# Patient Record
Sex: Female | Born: 1990 | Race: White | Hispanic: No | Marital: Married | Smoking: Never smoker
Health system: Southern US, Community
[De-identification: ages and names within clinical notes are randomized; demographics above are authoritative.]

## PROBLEM LIST (undated history)

## (undated) DIAGNOSIS — E282 Polycystic ovarian syndrome: Secondary | ICD-10-CM

## (undated) DIAGNOSIS — E785 Hyperlipidemia, unspecified: Secondary | ICD-10-CM

## (undated) DIAGNOSIS — Z87442 Personal history of urinary calculi: Secondary | ICD-10-CM

## (undated) DIAGNOSIS — I10 Essential (primary) hypertension: Secondary | ICD-10-CM

## (undated) DIAGNOSIS — F339 Major depressive disorder, recurrent, unspecified: Secondary | ICD-10-CM

## (undated) DIAGNOSIS — F419 Anxiety disorder, unspecified: Secondary | ICD-10-CM

## (undated) DIAGNOSIS — K760 Fatty (change of) liver, not elsewhere classified: Secondary | ICD-10-CM

## (undated) DIAGNOSIS — N979 Female infertility, unspecified: Secondary | ICD-10-CM

## (undated) DIAGNOSIS — E8881 Metabolic syndrome: Secondary | ICD-10-CM

## (undated) DIAGNOSIS — N96 Recurrent pregnancy loss: Secondary | ICD-10-CM

## (undated) DIAGNOSIS — K829 Disease of gallbladder, unspecified: Secondary | ICD-10-CM

## (undated) DIAGNOSIS — R0602 Shortness of breath: Secondary | ICD-10-CM

## (undated) DIAGNOSIS — E669 Obesity, unspecified: Secondary | ICD-10-CM

## (undated) DIAGNOSIS — R7303 Prediabetes: Secondary | ICD-10-CM

## (undated) DIAGNOSIS — F411 Generalized anxiety disorder: Secondary | ICD-10-CM

## (undated) DIAGNOSIS — Z9889 Other specified postprocedural states: Secondary | ICD-10-CM

## (undated) DIAGNOSIS — M549 Dorsalgia, unspecified: Secondary | ICD-10-CM

## (undated) DIAGNOSIS — E039 Hypothyroidism, unspecified: Secondary | ICD-10-CM

## (undated) DIAGNOSIS — R112 Nausea with vomiting, unspecified: Secondary | ICD-10-CM

## (undated) DIAGNOSIS — D649 Anemia, unspecified: Secondary | ICD-10-CM

## (undated) DIAGNOSIS — F410 Panic disorder [episodic paroxysmal anxiety] without agoraphobia: Secondary | ICD-10-CM

## (undated) DIAGNOSIS — R600 Localized edema: Secondary | ICD-10-CM

## (undated) HISTORY — DX: Localized edema: R60.0

## (undated) HISTORY — PX: DILATION AND CURETTAGE OF UTERUS: SHX78

## (undated) HISTORY — DX: Dorsalgia, unspecified: M54.9

## (undated) HISTORY — DX: Obesity, unspecified: E66.9

## (undated) HISTORY — DX: Shortness of breath: R06.02

## (undated) HISTORY — DX: Essential (primary) hypertension: I10

## (undated) HISTORY — DX: Disease of gallbladder, unspecified: K82.9

## (undated) HISTORY — DX: Hyperlipidemia, unspecified: E78.5

## (undated) HISTORY — DX: Fatty (change of) liver, not elsewhere classified: K76.0

## (undated) HISTORY — PX: WISDOM TOOTH EXTRACTION: SHX21

## (undated) HISTORY — DX: Anxiety disorder, unspecified: F41.9

## (undated) HISTORY — DX: Anemia, unspecified: D64.9

## (undated) HISTORY — DX: Generalized anxiety disorder: F41.1

## (undated) HISTORY — DX: Metabolic syndrome: E88.810

## (undated) HISTORY — DX: Female infertility, unspecified: N97.9

## (undated) HISTORY — DX: Prediabetes: R73.03

## (undated) HISTORY — PX: OTHER SURGICAL HISTORY: SHX169

## (undated) HISTORY — DX: Major depressive disorder, recurrent, unspecified: F33.9

## (undated) HISTORY — DX: Metabolic syndrome: E88.81

## (undated) HISTORY — DX: Recurrent pregnancy loss: N96

## (undated) HISTORY — DX: Polycystic ovarian syndrome: E28.2

---
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
## 2011-06-20 ENCOUNTER — Ambulatory Visit (INDEPENDENT_AMBULATORY_CARE_PROVIDER_SITE_OTHER): Payer: BC Managed Care – PPO | Admitting: Gynecology

## 2011-06-20 ENCOUNTER — Encounter: Payer: Self-pay | Admitting: Gynecology

## 2011-06-20 ENCOUNTER — Other Ambulatory Visit (HOSPITAL_COMMUNITY)
Admission: RE | Admit: 2011-06-20 | Discharge: 2011-06-20 | Disposition: A | Discharge status: Home / Self Care | Payer: BC Managed Care – PPO | Source: Ambulatory Visit | Attending: Gynecology | Admitting: Gynecology

## 2011-06-20 VITALS — BP 124/68 | Ht 63.0 in | Wt 249.0 lb

## 2011-06-20 DIAGNOSIS — Z01419 Encounter for gynecological examination (general) (routine) without abnormal findings: Secondary | ICD-10-CM | POA: Insufficient documentation

## 2011-06-20 DIAGNOSIS — Z131 Encounter for screening for diabetes mellitus: Secondary | ICD-10-CM

## 2011-06-20 DIAGNOSIS — Z113 Encounter for screening for infections with a predominantly sexual mode of transmission: Secondary | ICD-10-CM

## 2011-06-20 DIAGNOSIS — Z13220 Encounter for screening for lipoid disorders: Secondary | ICD-10-CM

## 2011-06-20 LAB — CBC WITH DIFFERENTIAL/PLATELET
Basophils Absolute: 0 10*3/uL (ref 0.0–0.1)
Basophils Relative: 0 % (ref 0–1)
Eosinophils Absolute: 0.5 10*3/uL (ref 0.0–0.7)
Eosinophils Relative: 5 % (ref 0–5)
HCT: 37.3 % (ref 36.0–46.0)
Hemoglobin: 12.6 g/dL (ref 12.0–15.0)
MCH: 27.3 pg (ref 26.0–34.0)
MCHC: 33.8 g/dL (ref 30.0–36.0)
MCV: 80.9 fL (ref 78.0–100.0)
Monocytes Absolute: 0.8 10*3/uL (ref 0.1–1.0)
Monocytes Relative: 8 % (ref 3–12)
RDW: 13.9 % (ref 11.5–15.5)

## 2011-06-20 LAB — LIPID PANEL
Cholesterol: 178 mg/dL (ref 0–200)
LDL Cholesterol: 102 mg/dL — ABNORMAL HIGH (ref 0–99)
Total CHOL/HDL Ratio: 4.7 Ratio
VLDL: 38 mg/dL (ref 0–40)

## 2011-06-20 NOTE — Progress Notes (Signed)
Diana Avery [birth year] [id number]        21 y.o.  New patient for annual exam.    Past medical history,surgical history, medications, allergies, family history and social history were all reviewed and documented in the EPIC chart. ROS:  Was performed and pertinent positives and negatives are included in the history.  Exam: Diana Avery chaperone present Filed Vitals:   06/20/11 1359  BP: 124/68   General appearance  Normal Skin grossly normal Head/Neck normal with no cervical or supraclavicular adenopathy thyroid normal Lungs  clear Cardiac RR, without RMG Abdominal  soft, nontender, without masses, organomegaly or hernia Breasts  examined lying and sitting without masses, retractions, discharge or axillary adenopathy. Pelvic  Ext/BUS/vagina  normal   Cervix  normal Pap done  Uterus  anteverted, normal size, shape and contour, midline and mobile nontender   Adnexa  Without masses or tenderness    Anus and perineum  normal     Assessment/Plan:  21 y.o. female for annual exam. 1. Contraceptive management. Patient sexually active and not using consistent contraception. Had used birth control pills but had difficulty remembering. She tried Depo-Provera but gained a lot of weight.  She did use NuvaRing and did well but wants to consider Nexplanon. I reviewed all available forms of reversible contraception to include pills patch ring, Nexplanon Depo-Provera, IUD.  Risks and benefits, pros and cons of each option reviewed. Patient's interested in either Nexplanon or Mirena IUD. Insertional process for both were reviewed.  The risks to include infection, perforation requiring surgery to remove, migration and failure with IUD discussed. The risks of infection, migration, underlying neurovascular injury, failure and surgery required for removal with Nexplanon discussed.  Patient will decide and follow up during her menses. 2. STD screening. GC Chlamydia screen done. Need for condoms regardless a  decrease STD risk discussed. 3. Gardasil. Patient reports having the Gardasil series x3 at age 43. 4. Breast health. SBE monthly reviewed. 5. Pap smears. Pap smear done today. Discussed every 3 year screening per current screening guidelines. 6. Health maintenance. Baseline CBC glucose lipid profile urinalysis ordered.   Diana Lords MD, 3:01 PM 06/20/2011

## 2011-06-20 NOTE — Patient Instructions (Signed)
Follow up with contraception decision.  Follow up in one year for annual gynecologic exam

## 2011-06-21 ENCOUNTER — Telehealth: Payer: Self-pay | Admitting: *Deleted

## 2011-06-21 ENCOUNTER — Other Ambulatory Visit: Payer: Self-pay | Admitting: *Deleted

## 2011-06-21 DIAGNOSIS — Z3049 Encounter for surveillance of other contraceptives: Secondary | ICD-10-CM

## 2011-06-21 LAB — GC/CHLAMYDIA PROBE AMP, GENITAL: GC Probe Amp, Genital: NEGATIVE

## 2011-06-21 LAB — URINALYSIS W MICROSCOPIC + REFLEX CULTURE
Bilirubin Urine: NEGATIVE
Nitrite: NEGATIVE
Protein, ur: NEGATIVE mg/dL
Urobilinogen, UA: 0.2 mg/dL (ref 0.0–1.0)

## 2011-06-21 MED ORDER — ETONOGESTREL 68 MG ~~LOC~~ IMPL: 1.0000 | DRUG_IMPLANT | Freq: Once | SUBCUTANEOUS | Status: DC

## 2011-06-21 NOTE — Telephone Encounter (Signed)
Patient informed.  Will go with Nexplanon.  Order placed.

## 2011-06-21 NOTE — Telephone Encounter (Signed)
Lm for patient to call.  Mirena IUD and Nexplanon are covered at 100%.

## 2011-06-22 ENCOUNTER — Other Ambulatory Visit: Payer: Self-pay | Admitting: *Deleted

## 2011-06-22 DIAGNOSIS — E7800 Pure hypercholesterolemia, unspecified: Secondary | ICD-10-CM

## 2011-06-22 DIAGNOSIS — D691 Qualitative platelet defects: Secondary | ICD-10-CM

## 2011-06-22 DIAGNOSIS — R635 Abnormal weight gain: Secondary | ICD-10-CM

## 2011-06-22 LAB — URINE CULTURE: Colony Count: 3000

## 2011-06-27 ENCOUNTER — Encounter: Payer: Self-pay | Admitting: Gynecology

## 2011-06-27 ENCOUNTER — Ambulatory Visit (INDEPENDENT_AMBULATORY_CARE_PROVIDER_SITE_OTHER): Payer: BC Managed Care – PPO | Admitting: Gynecology

## 2011-06-27 DIAGNOSIS — Z309 Encounter for contraceptive management, unspecified: Secondary | ICD-10-CM

## 2011-06-27 DIAGNOSIS — E7800 Pure hypercholesterolemia, unspecified: Secondary | ICD-10-CM

## 2011-06-27 DIAGNOSIS — IMO0001 Reserved for inherently not codable concepts without codable children: Secondary | ICD-10-CM

## 2011-06-27 DIAGNOSIS — D691 Qualitative platelet defects: Secondary | ICD-10-CM

## 2011-06-27 DIAGNOSIS — Z30017 Encounter for initial prescription of implantable subdermal contraceptive: Secondary | ICD-10-CM

## 2011-06-27 DIAGNOSIS — R635 Abnormal weight gain: Secondary | ICD-10-CM

## 2011-06-27 DIAGNOSIS — Z131 Encounter for screening for diabetes mellitus: Secondary | ICD-10-CM

## 2011-06-27 LAB — GLUCOSE, RANDOM: Glucose, Bld: 88 mg/dL (ref 70–99)

## 2011-06-27 LAB — CBC WITH DIFFERENTIAL/PLATELET
Basophils Absolute: 0 10*3/uL (ref 0.0–0.1)
Eosinophils Relative: 4 % (ref 0–5)
Lymphocytes Relative: 27 % (ref 12–46)
Neutro Abs: 6.3 10*3/uL (ref 1.7–7.7)
Neutrophils Relative %: 61 % (ref 43–77)
Platelets: 440 10*3/uL — ABNORMAL HIGH (ref 150–400)
RDW: 14.1 % (ref 11.5–15.5)
WBC: 10.6 10*3/uL — ABNORMAL HIGH (ref 4.0–10.5)

## 2011-06-27 LAB — LIPID PANEL
Cholesterol: 176 mg/dL (ref 0–200)
HDL: 40 mg/dL (ref >39)
Triglycerides: 123 mg/dL (ref <150)

## 2011-06-27 NOTE — Patient Instructions (Signed)
Report any issues with your Nexplanon. Otherwise follow up routinely.

## 2011-06-27 NOTE — Progress Notes (Signed)
Patient presents for Nexplanon placement. She she has thoughts of all of her contraceptive options as we had previously discussed and has decided on Nexplanon. She is just finishing a normal menses and has had no recent intercourse.  I again reviewed with her which involved with the insertional process in the risks of Nexplanon to include infection, damage to underlying neurovascular structures, implant migration, the need for surgical removal in 3 years and the possible difficulty with this and lastly the risk of failure with pregnancy. I reviewed with her her increased weight and the possible increased failure risk associated with Nexplanon and increased weight and she understands and accepts this possible increased failure risk with pregnancy.  Patient has read through the consent form and signed the consent. She is right-handed.  Procedure: Left upper arm was examined and the site of insertion was identified and marked. The distal insertion site was cleansed with Betadine and the insertion tract infiltrated with 1% Xylocaine. The Nexplanon was inserted without difficulty according to manufacturer's recommendation. A Steri-Strip was used to close the skin defect and a pressure dressing applied. Postoperative instructions were given. The patient tolerated well.  Nexplanon lot #[id number]  The patient also had repeat fasting lab work done today to include a glucose lipid profile CBC and TSH.

## 2011-06-28 ENCOUNTER — Other Ambulatory Visit: Payer: Self-pay | Admitting: *Deleted

## 2011-06-28 DIAGNOSIS — E039 Hypothyroidism, unspecified: Secondary | ICD-10-CM

## 2011-06-28 DIAGNOSIS — D691 Qualitative platelet defects: Secondary | ICD-10-CM

## 2011-06-28 NOTE — Progress Notes (Signed)
Addended by: Dara Lords on: 06/28/2011 11:13 AM   Modules accepted: Orders

## 2011-08-02 ENCOUNTER — Other Ambulatory Visit: Payer: BC Managed Care – PPO

## 2011-08-02 DIAGNOSIS — E039 Hypothyroidism, unspecified: Secondary | ICD-10-CM

## 2011-08-02 DIAGNOSIS — D691 Qualitative platelet defects: Secondary | ICD-10-CM

## 2011-08-02 LAB — TSH: TSH: 5.82 u[IU]/mL — ABNORMAL HIGH (ref 0.350–4.500)

## 2011-08-02 LAB — CBC WITH DIFFERENTIAL/PLATELET
Basophils Absolute: 0 10*3/uL (ref 0.0–0.1)
Basophils Relative: 0 % (ref 0–1)
Hemoglobin: 12.7 g/dL (ref 12.0–15.0)
Lymphocytes Relative: 28 % (ref 12–46)
MCHC: 33.6 g/dL (ref 30.0–36.0)
Monocytes Relative: 8 % (ref 3–12)
Neutro Abs: 6.1 10*3/uL (ref 1.7–7.7)
Neutrophils Relative %: 61 % (ref 43–77)
RBC: 4.71 MIL/uL (ref 3.87–5.11)
WBC: 9.9 10*3/uL (ref 4.0–10.5)

## 2011-08-03 ENCOUNTER — Telehealth: Payer: Self-pay | Admitting: Gynecology

## 2011-08-03 DIAGNOSIS — E039 Hypothyroidism, unspecified: Secondary | ICD-10-CM

## 2011-08-03 NOTE — Telephone Encounter (Signed)
Pt informed with the below note, labs and office note faxed to dr. Talmage Nap office. Her office will fax appointment time and date back to our office.

## 2011-08-03 NOTE — Telephone Encounter (Signed)
Left message for pt to call.

## 2011-08-03 NOTE — Telephone Encounter (Signed)
Tell patient that her thyroid still is slightly hypoactive. I would recommend appointment with endocrinologist such as Dr. Horald Pollen to see her evaluate and possibly treat.  Her platelets are also mildly elevated but I don't think we need to follow up at this point but recommend we repeat her CBC after her thyroid is evaluated and treated.

## 2011-08-04 NOTE — Telephone Encounter (Signed)
Left message on pt voicemail her appointment time and date for Dr.Balan office 09/18/11 10:00 am. Left # for pt to call if rescheduled needed.

## 2011-09-18 ENCOUNTER — Other Ambulatory Visit: Payer: Self-pay | Admitting: Endocrinology

## 2011-09-18 DIAGNOSIS — E049 Nontoxic goiter, unspecified: Secondary | ICD-10-CM

## 2011-09-26 ENCOUNTER — Other Ambulatory Visit: Payer: BC Managed Care – PPO

## 2011-09-28 ENCOUNTER — Ambulatory Visit
Admission: RE | Admit: 2011-09-28 | Discharge: 2011-09-28 | Disposition: A | Discharge status: Home / Self Care | Payer: BC Managed Care – PPO | Source: Ambulatory Visit | Attending: Endocrinology | Admitting: Endocrinology

## 2011-09-28 DIAGNOSIS — E049 Nontoxic goiter, unspecified: Secondary | ICD-10-CM

## 2011-12-05 ENCOUNTER — Encounter (HOSPITAL_COMMUNITY): Payer: Self-pay | Admitting: Family

## 2011-12-05 ENCOUNTER — Inpatient Hospital Stay (HOSPITAL_COMMUNITY)
Admission: AD | Admit: 2011-12-05 | Discharge: 2011-12-05 | Disposition: A | Discharge status: Home / Self Care | Payer: BC Managed Care – PPO | Source: Ambulatory Visit | Attending: Gynecology | Admitting: Gynecology

## 2011-12-05 DIAGNOSIS — N949 Unspecified condition associated with female genital organs and menstrual cycle: Secondary | ICD-10-CM | POA: Insufficient documentation

## 2011-12-05 DIAGNOSIS — R109 Unspecified abdominal pain: Secondary | ICD-10-CM | POA: Insufficient documentation

## 2011-12-05 DIAGNOSIS — N390 Urinary tract infection, site not specified: Secondary | ICD-10-CM | POA: Insufficient documentation

## 2011-12-05 HISTORY — DX: Hypothyroidism, unspecified: E03.9

## 2011-12-05 HISTORY — DX: Panic disorder (episodic paroxysmal anxiety): F41.0

## 2011-12-05 LAB — URINALYSIS, ROUTINE W REFLEX MICROSCOPIC
Leukocytes, UA: NEGATIVE
Specific Gravity, Urine: 1.025 (ref 1.005–1.030)
Urobilinogen, UA: 0.2 mg/dL (ref 0.0–1.0)

## 2011-12-05 LAB — URINE MICROSCOPIC-ADD ON

## 2011-12-05 LAB — POCT PREGNANCY, URINE: Preg Test, Ur: NEGATIVE

## 2011-12-05 MED ORDER — KEFLEX 500 MG PO CAPS: 500.0000 mg | ORAL_CAPSULE | Freq: Three times a day (TID) | ORAL | Status: DC

## 2011-12-05 NOTE — MAU Note (Signed)
Pt reports having pain from vaginal/rectal up into lower abdomen since intercourse last night.  Reports having had urinary frequency for over one week. Urinates about 15 x per 8-hour day.

## 2011-12-05 NOTE — MAU Note (Signed)
Pt states having severe lower abd pain that wraps around to her low back, occured post intercourse. Denies abnormal discharge or bleeding. Has implanon in arm, no menses since August.

## 2011-12-05 NOTE — MAU Provider Note (Signed)
History     CSN: [id number]  Arrival date and time: 12/05/11 2595   First Provider Initiated Contact with Patient 12/05/11 1046      Chief Complaint  Patient presents with  . Abdominal Pain   HPI Pt is here with report of lower pelvic pain that started last night.  Pain is described as sharp pain that radiates to the lower back.  LMP No LMP recorded. Patient has had an implant.  August last incident of bleeding.  One incident of vomiting last night.  +Urinary frequency and dysuria.  No report of abnormal vaginal discharge.     Past Medical History  Diagnosis Date  . Hypothyroidism     Synthroid  - once daily   . Panic attacks     not treated    Past Surgical History  Procedure Date  . Wisdom tooth extraction     Family History  Problem Relation Age of Onset  . Cancer Maternal Grandmother     multiple myleoma  . Heart disease Maternal Grandfather   . Heart disease Paternal Grandfather     History  Substance Use Topics  . Smoking status: Never Smoker   . Smokeless tobacco: Never Used  . Alcohol Use: Yes     Comment: none    Allergies: No Known Allergies  Prescriptions prior to admission  Medication Sig Dispense Refill  . Cyanocobalamin (VITAMIN B-12 PO) Take by mouth.      . levothyroxine (SYNTHROID, LEVOTHROID) 50 MCG tablet Take 50 mcg by mouth daily.      Marland Kitchen etonogestrel (IMPLANON) 68 MG IMPL implant Inject 1 each into the skin once. Patient got in June 2013 and it is good for 3 years      . [DISCONTINUED] etonogestrel (NEXPLANON) 68 MG IMPL implant Inject 1 each (68 mg total) into the skin once.  1 each  0    Review of Systems  Constitutional: Negative.   Gastrointestinal: Positive for vomiting and abdominal pain.  Genitourinary: Positive for dysuria and frequency. Negative for hematuria.  All other systems reviewed and are negative.   Physical Exam   Blood pressure 128/69, pulse 83, temperature 98.1 F (36.7 C), temperature source Oral, resp. rate  16, height 5\' 3"  (1.6 m), weight 113.059 kg (249 lb 4 oz).  Physical Exam  Constitutional: She is oriented to person, place, and time. She appears well-developed and well-nourished. No distress.  HENT:  Head: Normocephalic.  Neck: Normal range of motion. Neck supple.  Cardiovascular: Normal rate, regular rhythm and normal heart sounds.   Respiratory: Effort normal and breath sounds normal.  GI: Soft. There is tenderness (suprapubic). There is no CVA tenderness.  Musculoskeletal: Normal range of motion.  Neurological: She is alert and oriented to person, place, and time. She has normal reflexes.  Skin: Skin is warm and dry.    MAU Course  Procedures  Results for orders placed during the hospital encounter of 12/05/11 (from the past 24 hour(s))  URINALYSIS, ROUTINE W REFLEX MICROSCOPIC     Status: Abnormal   Collection Time   12/05/11  9:58 AM      Component Value Range   Color, Urine YELLOW  YELLOW   APPearance CLEAR  CLEAR   Specific Gravity, Urine 1.025  1.005 - 1.030   pH 6.0  5.0 - 8.0   Glucose, UA NEGATIVE  NEGATIVE mg/dL   Hgb urine dipstick SMALL (*) NEGATIVE   Bilirubin Urine NEGATIVE  NEGATIVE   Ketones, ur NEGATIVE  NEGATIVE  mg/dL   Protein, ur NEGATIVE  NEGATIVE mg/dL   Urobilinogen, UA 0.2  0.0 - 1.0 mg/dL   Nitrite POSITIVE (*) NEGATIVE   Leukocytes, UA NEGATIVE  NEGATIVE  URINE MICROSCOPIC-ADD ON     Status: Abnormal   Collection Time   12/05/11  9:58 AM      Component Value Range   Squamous Epithelial / LPF RARE  RARE   WBC, UA 7-10  <3 WBC/hpf   RBC / HPF 0-2  <3 RBC/hpf   Bacteria, UA MANY (*) RARE  POCT PREGNANCY, URINE     Status: Normal   Collection Time   12/05/11 10:12 AM      Component Value Range   Preg Test, Ur NEGATIVE  NEGATIVE     Assessment and Plan  Urinary Tract Infection  Plan: DC to home RX Keflex 500mg  TID x 7 days Urine Culture F/U if no improvement in 24-48 hours or worsening of symptoms.  [address] [employer] 12/05/2011,  10:49 AM

## 2011-12-05 NOTE — MAU Note (Signed)
Also notes increased urination in past week, denies burning.

## 2011-12-07 LAB — URINE CULTURE: Colony Count: >=100000

## 2011-12-12 ENCOUNTER — Encounter: Payer: Self-pay | Admitting: *Deleted

## 2011-12-12 NOTE — Progress Notes (Signed)
Patient ID: Diana Avery, female   DOB: 1990-08-05, 21 y.o.   MRN: [id number] Pt called c/o possible broken Implanon? I called patient back and left message on her voicemail to make OV for TF to look at it.

## 2011-12-19 ENCOUNTER — Ambulatory Visit (INDEPENDENT_AMBULATORY_CARE_PROVIDER_SITE_OTHER): Payer: BC Managed Care – PPO | Admitting: Gynecology

## 2011-12-19 ENCOUNTER — Encounter: Payer: Self-pay | Admitting: Gynecology

## 2011-12-19 DIAGNOSIS — Z309 Encounter for contraceptive management, unspecified: Secondary | ICD-10-CM

## 2011-12-19 NOTE — Patient Instructions (Signed)
Office will call you by next week in follow up of the Implanon breakage issue

## 2011-12-19 NOTE — Progress Notes (Signed)
Patient presents noticing that her Implanon rod feels to be broken. She just noticed it this week. She does note that recently she has started a weightlifting program which involves upper arm machine work. She is amenorrheic and having no other problems.  Exam Upper left arm Implanon rod does feel to be "snapped" in the middle. The 2 sections are connected but clearly there is a joint effect with manipulation.  Assessment and plan: Broken Implanon feels connected in the middle. We'll contact manufacturer's representative to ask for guidance as far as efficacy. Discussed with patient has never had this in a patient previously and at this point I would recommend backup contraception until we get more information from the manufacturer. Potential to have this removed and replaced discussed.

## 2012-01-08 ENCOUNTER — Telehealth: Payer: Self-pay | Admitting: *Deleted

## 2012-01-08 NOTE — Telephone Encounter (Signed)
Message copied by Richardson Chiquito on Mon Jan 08, 2012  1:13 PM ------      Message from: Dara Lords      Created: Wed Jan 03, 2012  4:02 PM       [name] tell patient the data from the company is sparse and no one is guaranteeing the efficacy of the implant.  Options would be to keep it and accept the possible increased failure risk versus removing it and replacing it with another rod at the same time or choosing a different birth control after removing it. I would be glad to talk to her more about this if she wants to make an appointment or if she wants to have it removed or replaced but she can make that appointment also.

## 2012-01-08 NOTE — Telephone Encounter (Signed)
I spoke to pt regarding information from Dr Audie Box. Pt does want to proceed with removal and insertion of new rod. We will check benefits and call back with information and to make an appt. KW

## 2012-01-10 ENCOUNTER — Other Ambulatory Visit: Payer: Self-pay | Admitting: Gynecology

## 2012-01-11 ENCOUNTER — Encounter: Payer: Self-pay | Admitting: *Deleted

## 2012-01-11 ENCOUNTER — Ambulatory Visit (INDEPENDENT_AMBULATORY_CARE_PROVIDER_SITE_OTHER): Payer: BC Managed Care – PPO | Admitting: Gynecology

## 2012-01-11 DIAGNOSIS — Z3046 Encounter for surveillance of implantable subdermal contraceptive: Secondary | ICD-10-CM

## 2012-01-11 DIAGNOSIS — Z30017 Encounter for initial prescription of implantable subdermal contraceptive: Secondary | ICD-10-CM

## 2012-01-11 NOTE — Patient Instructions (Signed)
Call if you have any problems with the Implanon insertion site. Otherwise follow up routinely when you're due for your next checkup.

## 2012-01-11 NOTE — Progress Notes (Signed)
Patient presents in follow up with a bent or broken Nexplanon rod. We had discussed previously whether to remove this or leave this. After reviewing the literature and the manufacturer's information I reviewed with the patient that I do not think we can not guarantee contraceptive efficacy and that my recommendation would be to remove the rod and replace it. The patient agrees with this and presents today for removal of her broken Implanon rod and replacement.  We have reviewed the risks/benefits pros/cons of Implanon as noted in the 06/27/2011 note. The patient has no questions and ultimately signed the consent form for today's removal and reinsertion.  Procedure: Left upper arm was examined and the Nexplanon rod was palpated felt to be broken/bend in the middle. The skin overlying the distal end of the rod was cleansed with Betadine and infiltrated with 1% lidocaine. A stab incision was made with a scalpel and through manipulating the rod pushing towards the stab incision and using the scalpel to clear the overlying tissue and capsule the Nexplanon rod became visible at the incision site was grasped with a forcep and the complete rod was removed. Upon examination there was a crack in the mid portion of the rod but reapproximated well without evidence of any missing fragments.  The insertional tract was then infiltrated subcutaneously with 1% lidocaine and the new Nexplanon was placed through the stab incision site without difficulty according to manufacturer's recommendations. A Steri-Strip was used to reapproximate the skin. A sterile dressing applied and a pressure dressing applied. Postoperative instructions were given to the patient she will leave the dressing on tomorrow. She will follow up routinely or sooner if any issues.  Lot number: [id number]

## 2012-01-12 ENCOUNTER — Encounter: Payer: Self-pay | Admitting: Gynecology

## 2012-06-25 ENCOUNTER — Ambulatory Visit (INDEPENDENT_AMBULATORY_CARE_PROVIDER_SITE_OTHER): Payer: BC Managed Care – PPO | Admitting: Gynecology

## 2012-06-25 ENCOUNTER — Telehealth: Payer: Self-pay | Admitting: *Deleted

## 2012-06-25 ENCOUNTER — Encounter: Payer: Self-pay | Admitting: Gynecology

## 2012-06-25 DIAGNOSIS — Z309 Encounter for contraceptive management, unspecified: Secondary | ICD-10-CM

## 2012-06-25 DIAGNOSIS — Z3046 Encounter for surveillance of implantable subdermal contraceptive: Secondary | ICD-10-CM

## 2012-06-25 NOTE — Telephone Encounter (Signed)
Pt has new Nexplanon placed back in Dec 2013, pt said she believes it had been damaged. Pt said it hurts and looks blue and swollen. She is scheduled for her annual on 07/01/12. I told pt OV best to exam, but spoke with front desk, I was told to check with you to see if you want OV now or okay to wait until Monday? Due to busy schedule. Please advise

## 2012-06-25 NOTE — Progress Notes (Signed)
Patient [place] fire [place] was felt to break her nexplanon rod. She has bruising in her left upper arm and feels that the nexplanon is no longer intact.  Exam Left upper arm exam reveals nexplanon rod feels to be broken in the middle. It's difficult to tell whether the 2 ends are separated or connected but clearly broken.  Assessment and plan: Broken nexplanon rod. As discussed previously with her when she broke her first nexplanon 11/2011 it is unclear whether contraceptive efficacy remains intact if the rod is broken and as per our prior discussion she wants to go ahead and have the rod removed. I discussed with her that if the 2 ends are not connected I may have to make separate incisions and that there may be small fragments remain inside and whether we should pursue exploration or x-ray studies in followup.  Procedure: The skin overlying the distal end of the rod was cleansed with Betadine and infiltrated with 1% lidocaine. A stab incision was made with a scalpel and through manipulating the rod pushing towards the stab incision and using the scalpel to clear the overlying tissue and capsule the Nexplanon rod became visible at the incision site was grasped with a forcep and the complete rod was removed. Upon examination there was a crack in the mid portion of the rod but reapproximated well without evidence of any missing fragments.  The skin incision was closed with a Steri-Strip and a pressure dressing applied with postoperative instructions to be removed the next day. I reviewed with the patient that I am leery placing a third nexplanon and that we should consider alternative birth control. I think she would be a good candidate for IUD and she actually has an appointment with me next week for her annual exam I will review those options again. The need for barrier contraception in the interim. I provided her with the broken rod as she wanted to keep this.

## 2012-06-25 NOTE — Telephone Encounter (Signed)
She needs office visit now

## 2012-06-25 NOTE — Telephone Encounter (Signed)
Front desk informed to call pt.

## 2012-06-25 NOTE — Patient Instructions (Signed)
Followup for annual exam as scheduled and we will discuss contraceptive options.

## 2012-07-01 ENCOUNTER — Encounter: Payer: Self-pay | Admitting: Gynecology

## 2012-07-01 ENCOUNTER — Ambulatory Visit (INDEPENDENT_AMBULATORY_CARE_PROVIDER_SITE_OTHER): Payer: BC Managed Care – PPO | Admitting: Gynecology

## 2012-07-01 VITALS — BP 140/80 | Ht 64.0 in | Wt 241.0 lb

## 2012-07-01 DIAGNOSIS — Z01419 Encounter for gynecological examination (general) (routine) without abnormal findings: Secondary | ICD-10-CM

## 2012-07-01 DIAGNOSIS — Z309 Encounter for contraceptive management, unspecified: Secondary | ICD-10-CM

## 2012-07-01 DIAGNOSIS — Z13220 Encounter for screening for lipoid disorders: Secondary | ICD-10-CM

## 2012-07-01 LAB — COMPREHENSIVE METABOLIC PANEL WITH GFR
ALT: 50 U/L — ABNORMAL HIGH (ref 0–35)
AST: 27 U/L (ref 0–37)
Albumin: 4.2 g/dL (ref 3.5–5.2)
Alkaline Phosphatase: 59 U/L (ref 39–117)
BUN: 12 mg/dL (ref 6–23)
CO2: 23 meq/L (ref 19–32)
Calcium: 9.2 mg/dL (ref 8.4–10.5)
Chloride: 106 meq/L (ref 96–112)
Creat: 0.8 mg/dL (ref 0.50–1.10)
Glucose, Bld: 98 mg/dL (ref 70–99)
Potassium: 4.6 meq/L (ref 3.5–5.3)
Sodium: 139 meq/L (ref 135–145)
Total Bilirubin: 0.4 mg/dL (ref 0.3–1.2)
Total Protein: 7.6 g/dL (ref 6.0–8.3)

## 2012-07-01 LAB — CBC WITH DIFFERENTIAL/PLATELET
Basophils Absolute: 0 10*3/uL (ref 0.0–0.1)
Basophils Relative: 0 % (ref 0–1)
HCT: 39.8 % (ref 36.0–46.0)
Lymphocytes Relative: 30 % (ref 12–46)
MCHC: 33.7 g/dL (ref 30.0–36.0)
Neutro Abs: 5.4 10*3/uL (ref 1.7–7.7)
Neutrophils Relative %: 58 % (ref 43–77)
Platelets: 473 10*3/uL — ABNORMAL HIGH (ref 150–400)
RDW: 14 % (ref 11.5–15.5)
WBC: 9.4 10*3/uL (ref 4.0–10.5)

## 2012-07-01 LAB — LIPID PANEL
Cholesterol: 144 mg/dL (ref 0–200)
HDL: 34 mg/dL — ABNORMAL LOW
LDL Cholesterol: 92 mg/dL (ref 0–99)
Total CHOL/HDL Ratio: 4.2 ratio
Triglycerides: 89 mg/dL
VLDL: 18 mg/dL (ref 0–40)

## 2012-07-01 NOTE — Patient Instructions (Signed)
Monitor your blood pressure. It remained elevated in the range of 140/90 then we may need to consider medication. Increasing exercise and decreasing weight will help in blood pressure management.  Intrauterine Device Insertion Most often, an intrauterine device (IUD) is inserted into the uterus to prevent pregnancy. There are 2 types of IUDs available:  Copper IUD. This type of IUD creates an environment that is not favorable to sperm survival. The mechanism of action of the copper IUD is not known for certain. It can stay in place for 10 years.  Hormone IUD. This type of IUD contains the hormone progestin (synthetic progesterone). The progestin thickens the cervical mucus and prevents sperm from entering the uterus, and it also thins the uterine lining. There is no evidence that the hormone IUD prevents implantation. The hormone IUD can stay in place for up to 5 years. An IUD is the most cost-effective birth control if left in place for the full duration. It may be removed at any time. LET YOUR CAREGIVER KNOW ABOUT:  Sensitivity to metals.  Medicines taken including herbs, eyedrops, over-the-counter medicines, and creams.  Use of steroids (by mouth or creams).  Previous problems with anesthetics or numbing medicine.  Previous gynecological surgery.  History of blood clots or clotting disorders.  Possibility of pregnancy.  Menstrual irregularities.  Concerns regarding unusual vaginal discharge or odors.  Previous experience with an IUD.  Other health problems. RISKS AND COMPLICATIONS  Accidental puncture (perforation) of the uterus.  Accidental placement of the IUD either in the muscle layer of the uterus (myometrium) or outside the uterus. If this happen, the IUD can be found essentially floating around the bowels. When this happens, the IUD must be taken out surgically.  The IUD may fall out of the uterus (expulsion). This is more common in women who have recently had a  child.   Pregnancy in the fallopian tube (ectopic). BEFORE THE PROCEDURE  Schedule the IUD insertion for when you will have your menstrual period or right after, to make sure you are not pregnant. Placement of the IUD is better tolerated shortly after a menstrual cycle.  You may need to take tests or be examined to make sure you are not pregnant.  You may be required to take a pregnancy test.  You may be required to get checked for sexually transmitted infections (STIs) prior to placement. Placing an IUD in someone who has an infection can make an infection worse.  You may be given a pain reliever to take 1 or 2 hours before the procedure.  An exam will be performed to determine the size and position of your uterus.  Ask your caregiver about changing or stopping your regular medicines. PROCEDURE   A tool (speculum) is placed in the vagina. This allows your caregiver to see the lower part of the uterus (cervix).  The cervix is prepped with a medicine that lowers the risk of infection.  You may be given a medicine to numb each side of the cervix (intracervical or paracervical block). This is used to block and control any discomfort with insertion.  A tool (uterine sound) is inserted into the uterus to determine the length of the uterine cavity and the direction the uterus may be tilted.  A slim instrument (IUD inserter) is inserted through the cervical canal and into your uterus.  The IUD is placed in the uterine cavity and the insertion device is removed.  The nylon string that is attached to the IUD, and used  for eventual IUD removal, is trimmed. It is trimmed so that it lays high in the vagina, just outside the cervix. AFTER THE PROCEDURE  You may have bleeding after the procedure. This is normal. It varies from light spotting for a few days to menstrual-like bleeding.  You may have mild cramping.  Practice checking the string coming out of the cervix to make sure the IUD  remains in the uterus. If you cannot feel the string, you should schedule a "string check" with your caregiver.  If you had a hormone IUD inserted, expect that your period may be lighter or nonexistent within a year's time (though this is not always the case). There may be delayed fertility with the hormone IUD as a result of its progesterone effect. When you are ready to become pregnant, it is suggested to have the IUD removed up to 1 year in advance.  Yearly exams are advised. Document Released: 09/07/2010 Document Revised: 04/03/2011 Document Reviewed: 09/07/2010 [facility] Patient Information 2014 [place], [place].

## 2012-07-01 NOTE — Progress Notes (Signed)
Diana Avery 09-17-1990 [id number]        22 y.o.  G1P0010 for annual exam.  Several issues noted below.  Past medical history,surgical history, medications, allergies, family history and social history were all reviewed and documented in the EPIC chart.  ROS:  Performed and pertinent positives and negatives are included in the history, assessment and plan .  Exam: Kim assistant Filed Vitals:   07/01/12 0923  BP: 140/80  Height: 5\' 4"  (1.626 m)  Weight: 241 lb (109.317 kg)   General appearance  Normal Skin grossly normal Head/Neck normal with no cervical or supraclavicular adenopathy thyroid normal Lungs  clear Cardiac RR, without RMG Abdominal  soft, nontender, without masses, organomegaly or hernia Breasts  examined lying and sitting without masses, retractions, discharge or axillary adenopathy. Bilateral nipple complaint has always, easily reverts Pelvic  Ext/BUS/vagina  normal   Cervix  normal   Uterus  anteverted, normal size, shape and contour, midline and mobile nontender   Adnexa  Without masses or tenderness    Anus and perineum  normal   Rectovaginal  normal sphincter tone without palpated masses or tenderness.    Assessment/Plan:  22 y.o. G4P0010 female for annual exam.   1. Contraceptive management. Patient recently had her nexplanon removed. She has broken to nexplanon's within the past year. We discussed contraceptive alternatives to include pill patch ring Depo-Provera IUD. She had a problem remembering taking the pills. I gave her literature on the IUD to include Mirena. I discussed the insertional process. I reviewed the risks to include infection, perforation/migration requiring surgery to remove, failure with pregnancy. Patient's going to review the literature and make an appointment to scheduled this. 2. Pap smear 2013. No Pap smear done today. No history of abnormal Pap smears. Plan repeat at 3 year interval per current screening guidelines. 3. Breast  health. SBE monthly reviewed. Bilateral nipple dimpling as always and easily reverts. 4. Health maintenance. Blood pressure 140/80. 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 I reviewed the borderline nature of this. Increasing exercise/decreasing weight discussed. If increases may consider treatment. Baseline CBC comprehensive metabolic panel lipid profile urinalysis ordered. Followup for IUD as planned.    Dara Lords MD, 9:48 AM 07/01/2012

## 2012-07-02 ENCOUNTER — Other Ambulatory Visit: Payer: Self-pay | Admitting: Gynecology

## 2012-07-02 ENCOUNTER — Telehealth: Payer: Self-pay | Admitting: Gynecology

## 2012-07-02 DIAGNOSIS — Z3049 Encounter for surveillance of other contraceptives: Secondary | ICD-10-CM

## 2012-07-02 DIAGNOSIS — R7401 Elevation of levels of liver transaminase levels: Secondary | ICD-10-CM

## 2012-07-02 LAB — URINALYSIS W MICROSCOPIC + REFLEX CULTURE
Glucose, UA: NEGATIVE mg/dL
Leukocytes, UA: NEGATIVE
Nitrite: NEGATIVE
Protein, ur: NEGATIVE mg/dL
Squamous Epithelial / LPF: NONE SEEN
pH: 6 (ref 5.0–8.0)

## 2012-07-02 MED ORDER — LEVONORGESTREL 20 MCG/24HR IU IUD: INTRAUTERINE_SYSTEM | Freq: Once | INTRAUTERINE | Status: DC

## 2012-07-02 NOTE — Telephone Encounter (Signed)
07/02/12-Pt was advised today that her [facility] ins covers the Mirena & insertion at 100%,no copay. She will call next cycle to schedule w/TF/WL

## 2012-08-20 ENCOUNTER — Ambulatory Visit: Payer: BC Managed Care – PPO

## 2012-08-21 ENCOUNTER — Ambulatory Visit (INDEPENDENT_AMBULATORY_CARE_PROVIDER_SITE_OTHER): Payer: BC Managed Care – PPO | Admitting: *Deleted

## 2012-08-21 DIAGNOSIS — Z111 Encounter for screening for respiratory tuberculosis: Secondary | ICD-10-CM

## 2012-08-23 LAB — TB SKIN TEST
Induration: 0 mm
TB Skin Test: NEGATIVE

## 2012-08-29 ENCOUNTER — Encounter (INDEPENDENT_AMBULATORY_CARE_PROVIDER_SITE_OTHER): Payer: BC Managed Care – PPO | Admitting: General Practice

## 2012-08-29 ENCOUNTER — Encounter: Payer: Self-pay | Admitting: General Practice

## 2012-08-29 NOTE — Progress Notes (Signed)
Patient ID: Diana Avery, female   DOB: 1990-10-05, 22 y.o.   MRN: [id number]  Patient only needed signature on form, for TB skin test reading.

## 2012-10-14 ENCOUNTER — Telehealth: Payer: Self-pay | Admitting: *Deleted

## 2012-10-14 DIAGNOSIS — N912 Amenorrhea, unspecified: Secondary | ICD-10-CM

## 2012-10-14 NOTE — Telephone Encounter (Signed)
Pt informed with the below note, transferred to front desk. Order placed for CMP

## 2012-10-14 NOTE — Telephone Encounter (Signed)
Recommend office visit to assess for not having her menses. She can have a comprehensive metabolic panel which we'll check her glucose along with her liver enzymes and other blood values.

## 2012-10-14 NOTE — Telephone Encounter (Signed)
Pt had nexplanon removed in June, was going to have mirena IUD placed on next cycle but cycle never came on. Her LMP was in august 2013, she took a UPT in August 2014 and it was negative. Pt asked if she could come have this inserted with out cycle starting? She also is due to have repeat liver enzymes check, but would like to know if she could have glucose level checked as well? Today she had some dizziness, shaking and feeling weak. Please advise

## 2012-10-15 ENCOUNTER — Ambulatory Visit (INDEPENDENT_AMBULATORY_CARE_PROVIDER_SITE_OTHER): Payer: BC Managed Care – PPO | Admitting: Gynecology

## 2012-10-15 ENCOUNTER — Encounter: Payer: Self-pay | Admitting: Gynecology

## 2012-10-15 DIAGNOSIS — N912 Amenorrhea, unspecified: Secondary | ICD-10-CM

## 2012-10-15 LAB — PROLACTIN: Prolactin: 10.7 ng/mL

## 2012-10-15 LAB — HCG, SERUM, QUALITATIVE: Preg, Serum: NEGATIVE

## 2012-10-15 LAB — FOLLICLE STIMULATING HORMONE: FSH: 1.8 m[IU]/mL

## 2012-10-15 NOTE — Progress Notes (Signed)
Patient presents having not had a period since August 2013. She did have nexplanon which was removed in June. Is sexually active. Had regular menses before nexplanon placement. Once Mirena IUD. No weight changes hair changes skin changes. No hot flushes night sweats or other hormonal-type symptoms.  Exam with Berenice Bouton External BUS vagina normal. Cervix normal. Uterus normal size midline mobile nontender. Adnexa without masses or tenderness.  Assessment and plan: Amenorrhea most likely anovulation. Check baseline labs to include qualitative hCG prolactin TSH FSH. Assuming labs normal plan progesterone withdrawal and Mirena IUD with menses.

## 2012-10-15 NOTE — Patient Instructions (Signed)
Office will contact you with lab results. Assuming negative then we'll plan progesterone medication to bring on a period annual followup for your Mirena IUD with your menses. If no menses then you will call.

## 2012-10-16 ENCOUNTER — Other Ambulatory Visit: Payer: Self-pay | Admitting: *Deleted

## 2012-10-16 DIAGNOSIS — E039 Hypothyroidism, unspecified: Secondary | ICD-10-CM

## 2012-10-16 MED ORDER — MEDROXYPROGESTERONE ACETATE 10 MG PO TABS: 10.0000 mg | ORAL_TABLET | Freq: Every day | ORAL | Status: DC

## 2012-10-16 MED ORDER — LEVOTHYROXINE SODIUM 75 MCG PO TABS: 75.0000 ug | ORAL_TABLET | Freq: Every day | ORAL | Status: DC

## 2012-10-29 ENCOUNTER — Telehealth: Payer: Self-pay | Admitting: *Deleted

## 2012-10-29 NOTE — Telephone Encounter (Signed)
Pt took provera 10 mg x 10 days last pill taking on 10/26/12 not cycle yet. I told pt to wait 2 weeks from taking last pill if no cycle then call office. Pt will do this.

## 2012-11-04 ENCOUNTER — Encounter: Payer: Self-pay | Admitting: Gynecology

## 2012-11-04 ENCOUNTER — Ambulatory Visit (INDEPENDENT_AMBULATORY_CARE_PROVIDER_SITE_OTHER): Payer: BC Managed Care – PPO | Admitting: Gynecology

## 2012-11-04 DIAGNOSIS — Z30430 Encounter for insertion of intrauterine contraceptive device: Secondary | ICD-10-CM

## 2012-11-04 NOTE — Progress Notes (Signed)
Patient presents for Mirena IUD placement. She has read through the booklet, has no contraindications and signed the consent form. She currently is on a normal menses after Provera withdrawal.  I reviewed the insertional process with her as well as the risks to include infection, either immediate or long-term, uterine perforation or migration requiring surgery to remove, other complications such as pain, hormonal side effects, infertility and possibility of failure with subsequent pregnancy.   Exam with [facility] assistant Pelvic: External BUS vagina normal. Cervix normal with heavy menses flow. Uterus anteverted, normal size shape contour midline mobile nontender. Adnexa without masses or tenderness.  Procedure: The cervix was cleansed with Betadine, anterior lip grasped with a single-tooth tenaculum, the uterus was sounded and a Mirena IUD was placed according to manufacturer's recommendations without difficulty. The strings were trimmed. The patient tolerated well and will follow up in one month for a postinsertional check.   I also reviewed her lab results with her to include Mildly elevated ALT of 50 and her elevated TSH of 8.7. She is on a higher dose of Synthroid now. Recommend repeat  her liver panel and TSH in 4 weeks when she comes back for her IUD check up. I reminded her to remind me to do so.  Lot number:  [id number]  Note: This document was prepared with digital dictation and possible smart phrase technology. Any transcriptional errors that result from this process are unintentional.

## 2012-11-04 NOTE — Patient Instructions (Signed)
Intrauterine Device Insertion Most often, an intrauterine device (IUD) is inserted into the uterus to prevent pregnancy. There are 2 types of IUDs available:  Copper IUD. This type of IUD creates an environment that is not favorable to sperm survival. The mechanism of action of the copper IUD is not known for certain. It can stay in place for 10 years.  Hormone IUD. This type of IUD contains the hormone progestin (synthetic progesterone). The progestin thickens the cervical mucus and prevents sperm from entering the uterus, and it also thins the uterine lining. There is no evidence that the hormone IUD prevents implantation. The hormone IUD can stay in place for up to 5 years. An IUD is the most cost-effective birth control if left in place for the full duration. It may be removed at any time. LET YOUR CAREGIVER KNOW ABOUT:  Sensitivity to metals.  Medicines taken including herbs, eyedrops, over-the-counter medicines, and creams.  Use of steroids (by mouth or creams).  Previous problems with anesthetics or numbing medicine.  Previous gynecological surgery.  History of blood clots or clotting disorders.  Possibility of pregnancy.  Menstrual irregularities.  Concerns regarding unusual vaginal discharge or odors.  Previous experience with an IUD.  Other health problems. RISKS AND COMPLICATIONS  Accidental puncture (perforation) of the uterus.  Accidental placement of the IUD either in the muscle layer of the uterus (myometrium) or outside the uterus. If this happen, the IUD can be found essentially floating around the bowels. When this happens, the IUD must be taken out surgically.  The IUD may fall out of the uterus (expulsion). This is more common in women who have recently had a child.   Pregnancy in the fallopian tube (ectopic). BEFORE THE PROCEDURE  Schedule the IUD insertion for when you will have your menstrual period or right after, to make sure you are not pregnant.  Placement of the IUD is better tolerated shortly after a menstrual cycle.  You may need to take tests or be examined to make sure you are not pregnant.  You may be required to take a pregnancy test.  You may be required to get checked for sexually transmitted infections (STIs) prior to placement. Placing an IUD in someone who has an infection can make an infection worse.  You may be given a pain reliever to take 1 or 2 hours before the procedure.  An exam will be performed to determine the size and position of your uterus.  Ask your caregiver about changing or stopping your regular medicines. PROCEDURE   A tool (speculum) is placed in the vagina. This allows your caregiver to see the lower part of the uterus (cervix).  The cervix is prepped with a medicine that lowers the risk of infection.  You may be given a medicine to numb each side of the cervix (intracervical or paracervical block). This is used to block and control any discomfort with insertion.  A tool (uterine sound) is inserted into the uterus to determine the length of the uterine cavity and the direction the uterus may be tilted.  A slim instrument (IUD inserter) is inserted through the cervical canal and into your uterus.  The IUD is placed in the uterine cavity and the insertion device is removed.  The nylon string that is attached to the IUD, and used for eventual IUD removal, is trimmed. It is trimmed so that it lays high in the vagina, just outside the cervix. AFTER THE PROCEDURE  You may have bleeding after the   procedure. This is normal. It varies from light spotting for a few days to menstrual-like bleeding.  You may have mild cramping.  Practice checking the string coming out of the cervix to make sure the IUD remains in the uterus. If you cannot feel the string, you should schedule a "string check" with your caregiver.  If you had a hormone IUD inserted, expect that your period may be lighter or nonexistent  within a year's time (though this is not always the case). There may be delayed fertility with the hormone IUD as a result of its progesterone effect. When you are ready to become pregnant, it is suggested to have the IUD removed up to 1 year in advance.  Yearly exams are advised. Document Released: 09/07/2010 Document Revised: 04/03/2011 Document Reviewed: 09/07/2010 [employer] Patient Information 2014 [employer].  

## 2012-11-05 ENCOUNTER — Encounter: Payer: Self-pay | Admitting: Gynecology

## 2012-11-14 ENCOUNTER — Telehealth: Payer: Self-pay | Admitting: *Deleted

## 2012-11-14 MED ORDER — DICLOFENAC POTASSIUM(MIGRAINE) 50 MG PO PACK: PACK | ORAL | Status: DC

## 2012-11-14 NOTE — Telephone Encounter (Signed)
Pt informed with the below note, rx sent. 

## 2012-11-14 NOTE — Telephone Encounter (Signed)
Pt had mirena IUD placed on 11/04/12, been c/o x 4 days headaches/migraines some nausea taking OTC tylenol and ibuprofen for headache and for migraine Excedrin which helped. Pt asked for any recommendations? Please advise

## 2012-11-14 NOTE — Telephone Encounter (Signed)
Left message for pt to call.

## 2012-11-14 NOTE — Telephone Encounter (Signed)
Cambia 50 mg packet.  #5, one by mouth with headache

## 2012-11-28 ENCOUNTER — Other Ambulatory Visit: Payer: Self-pay

## 2012-12-06 ENCOUNTER — Ambulatory Visit (INDEPENDENT_AMBULATORY_CARE_PROVIDER_SITE_OTHER): Payer: BC Managed Care – PPO | Admitting: Gynecology

## 2012-12-06 ENCOUNTER — Encounter: Payer: Self-pay | Admitting: Gynecology

## 2012-12-06 DIAGNOSIS — Z30431 Encounter for routine checking of intrauterine contraceptive device: Secondary | ICD-10-CM

## 2012-12-06 DIAGNOSIS — R748 Abnormal levels of other serum enzymes: Secondary | ICD-10-CM

## 2012-12-06 DIAGNOSIS — E039 Hypothyroidism, unspecified: Secondary | ICD-10-CM

## 2012-12-06 LAB — HEPATIC FUNCTION PANEL
ALT: 94 U/L — ABNORMAL HIGH (ref 0–35)
AST: 62 U/L — ABNORMAL HIGH (ref 0–37)
Albumin: 4.3 g/dL (ref 3.5–5.2)
Total Protein: 7.6 g/dL (ref 6.0–8.3)

## 2012-12-06 NOTE — Progress Notes (Signed)
Patient presents for Mirena IUD followup exam. She's done well without complaints. Also had recently increased her Synthroid due to an elevated TSH it is due to have her TSH repeated. On her comprehensive metabolic panel she had a ALT of 50 the remainder normal.  Exam with Selena Batten assistant External BUS vagina normal. Cervix normal with IUD string visualized and appropriate length. Uterus normal size midline mobile nontender. Adnexa without masses or tenderness.  Assessment and plan: Normal IUD check up. Followup June 2015 for annual exam when due. Check liver panel and TSH today.

## 2012-12-06 NOTE — Patient Instructions (Signed)
Intrauterine Device Insertion Most often, an intrauterine device (IUD) is inserted into the uterus to prevent pregnancy. There are 2 types of IUDs available:  Copper IUD. This type of IUD creates an environment that is not favorable to sperm survival. The mechanism of action of the copper IUD is not known for certain. It can stay in place for 10 years.  Hormone IUD. This type of IUD contains the hormone progestin (synthetic progesterone). The progestin thickens the cervical mucus and prevents sperm from entering the uterus, and it also thins the uterine lining. There is no evidence that the hormone IUD prevents implantation. The hormone IUD can stay in place for up to 5 years. An IUD is the most cost-effective birth control if left in place for the full duration. It may be removed at any time. LET YOUR CAREGIVER KNOW ABOUT:  Sensitivity to metals.  Medicines taken including herbs, eyedrops, over-the-counter medicines, and creams.  Use of steroids (by mouth or creams).  Previous problems with anesthetics or numbing medicine.  Previous gynecological surgery.  History of blood clots or clotting disorders.  Possibility of pregnancy.  Menstrual irregularities.  Concerns regarding unusual vaginal discharge or odors.  Previous experience with an IUD.  Other health problems. RISKS AND COMPLICATIONS  Accidental puncture (perforation) of the uterus.  Accidental placement of the IUD either in the muscle layer of the uterus (myometrium) or outside the uterus. If this happen, the IUD can be found essentially floating around the bowels. When this happens, the IUD must be taken out surgically.  The IUD may fall out of the uterus (expulsion). This is more common in women who have recently had a child.   Pregnancy in the fallopian tube (ectopic). BEFORE THE PROCEDURE  Schedule the IUD insertion for when you will have your menstrual period or right after, to make sure you are not pregnant.  Placement of the IUD is better tolerated shortly after a menstrual cycle.  You may need to take tests or be examined to make sure you are not pregnant.  You may be required to take a pregnancy test.  You may be required to get checked for sexually transmitted infections (STIs) prior to placement. Placing an IUD in someone who has an infection can make an infection worse.  You may be given a pain reliever to take 1 or 2 hours before the procedure.  An exam will be performed to determine the size and position of your uterus.  Ask your caregiver about changing or stopping your regular medicines. PROCEDURE   A tool (speculum) is placed in the vagina. This allows your caregiver to see the lower part of the uterus (cervix).  The cervix is prepped with a medicine that lowers the risk of infection.  You may be given a medicine to numb each side of the cervix (intracervical or paracervical block). This is used to block and control any discomfort with insertion.  A tool (uterine sound) is inserted into the uterus to determine the length of the uterine cavity and the direction the uterus may be tilted.  A slim instrument (IUD inserter) is inserted through the cervical canal and into your uterus.  The IUD is placed in the uterine cavity and the insertion device is removed.  The nylon string that is attached to the IUD, and used for eventual IUD removal, is trimmed. It is trimmed so that it lays high in the vagina, just outside the cervix. AFTER THE PROCEDURE  You may have bleeding after the   procedure. This is normal. It varies from light spotting for a few days to menstrual-like bleeding.  You may have mild cramping.  Practice checking the string coming out of the cervix to make sure the IUD remains in the uterus. If you cannot feel the string, you should schedule a "string check" with your caregiver.  If you had a hormone IUD inserted, expect that your period may be lighter or nonexistent  within a year's time (though this is not always the case). There may be delayed fertility with the hormone IUD as a result of its progesterone effect. When you are ready to become pregnant, it is suggested to have the IUD removed up to 1 year in advance.  Yearly exams are advised. Document Released: 09/07/2010 Document Revised: 04/03/2011 Document Reviewed: 06/30/2012 [employer] Patient Information 2014 [employer].  

## 2012-12-09 ENCOUNTER — Telehealth: Payer: Self-pay

## 2012-12-09 ENCOUNTER — Ambulatory Visit (INDEPENDENT_AMBULATORY_CARE_PROVIDER_SITE_OTHER): Payer: BC Managed Care – PPO | Admitting: General Practice

## 2012-12-09 ENCOUNTER — Other Ambulatory Visit: Payer: Self-pay | Admitting: Gynecology

## 2012-12-09 ENCOUNTER — Encounter: Payer: Self-pay | Admitting: Gastroenterology

## 2012-12-09 VITALS — BP 139/83 | HR 89 | Temp 97.3°F | Ht 63.5 in | Wt 262.0 lb

## 2012-12-09 DIAGNOSIS — L03031 Cellulitis of right toe: Secondary | ICD-10-CM

## 2012-12-09 DIAGNOSIS — L03039 Cellulitis of unspecified toe: Secondary | ICD-10-CM

## 2012-12-09 DIAGNOSIS — L600 Ingrowing nail: Secondary | ICD-10-CM

## 2012-12-09 DIAGNOSIS — E039 Hypothyroidism, unspecified: Secondary | ICD-10-CM

## 2012-12-09 DIAGNOSIS — R7989 Other specified abnormal findings of blood chemistry: Secondary | ICD-10-CM

## 2012-12-09 MED ORDER — LEVOTHYROXINE SODIUM 100 MCG PO TABS: 100.0000 ug | ORAL_TABLET | Freq: Every day | ORAL | Status: DC

## 2012-12-09 MED ORDER — CEPHALEXIN 500 MG PO CAPS: 500.0000 mg | ORAL_CAPSULE | Freq: Three times a day (TID) | ORAL | Status: DC

## 2012-12-09 NOTE — Patient Instructions (Signed)

## 2012-12-09 NOTE — Telephone Encounter (Signed)
error 

## 2012-12-09 NOTE — Telephone Encounter (Signed)
I called patient to let her know I scheduled her GI appt at [facility] with Dr. Melvia Heaps for 12/23/12 at 10:00am.  She will be returning to our office for viral hep panel.  She needs to talk with her employer about when she can come for labs and she will call to schedule a lab appt.

## 2012-12-09 NOTE — Progress Notes (Signed)
  Subjective:    Patient ID: Diana Avery, female    DOB: Jul 27, 1990, 22 y.o.   MRN: [id number]  Toe Pain  The incident occurred more than 1 week ago. The incident occurred at home. Injury mechanism: clipped right great toenail too short. The pain is present in the right toes. The quality of the pain is described as aching. The pain is at a severity of 6/10. The pain has been intermittent since onset. Pertinent negatives include no inability to bear weight, loss of motion, loss of sensation, muscle weakness, numbness or tingling. She reports no foreign bodies present. The symptoms are aggravated by palpation. Treatments tried: soaked in warm epsom salt water.      Review of Systems  Constitutional: Negative for fever and chills.  Respiratory: Negative for chest tightness and shortness of breath.   Cardiovascular: Negative for chest pain and palpitations.  Skin:       Redness to right great toenail bed  Neurological: Negative for tingling and numbness.       Objective:   Physical Exam  Constitutional: She is oriented to person, place, and time. She appears well-developed and well-nourished.  Cardiovascular: Normal rate, regular rhythm and normal heart sounds.   Pulmonary/Chest: Effort normal and breath sounds normal. No respiratory distress. She exhibits no tenderness.  Neurological: She is alert and oriented to person, place, and time.  Skin: Skin is warm and dry. There is erythema.  Erythema and 1+non pitting edema to right great toenail bed.           Assessment & Plan:  1. Infected nailbed of toe, right - cephALEXin (KEFLEX) 500 MG capsule; Take 1 capsule (500 mg total) by mouth 3 (three) times daily.  Dispense: 30 capsule; Refill: 0 -information sheet provided and discussed about infected right great toenail bed -discussed importance of proper foot hygiene and nail trimming -will refer to podiatrist if symptoms worsen or unresolved -RTO in one week for recheck -Patient  verbalized understanding -Coralie Keens, FNP-C

## 2012-12-11 ENCOUNTER — Other Ambulatory Visit: Payer: BC Managed Care – PPO

## 2012-12-11 DIAGNOSIS — R7989 Other specified abnormal findings of blood chemistry: Secondary | ICD-10-CM

## 2012-12-12 LAB — HEPATITIS PANEL, ACUTE
HCV Ab: NEGATIVE
Hepatitis B Surface Ag: NEGATIVE

## 2012-12-16 ENCOUNTER — Ambulatory Visit: Payer: BC Managed Care – PPO | Admitting: General Practice

## 2012-12-23 ENCOUNTER — Encounter: Payer: Self-pay | Admitting: Gastroenterology

## 2012-12-23 ENCOUNTER — Ambulatory Visit (HOSPITAL_COMMUNITY)
Admission: RE | Admit: 2012-12-23 | Discharge: 2012-12-23 | Disposition: A | Discharge status: Home / Self Care | Payer: BC Managed Care – PPO | Source: Ambulatory Visit | Attending: Gastroenterology | Admitting: Gastroenterology

## 2012-12-23 ENCOUNTER — Other Ambulatory Visit (INDEPENDENT_AMBULATORY_CARE_PROVIDER_SITE_OTHER): Payer: BC Managed Care – PPO

## 2012-12-23 ENCOUNTER — Ambulatory Visit (INDEPENDENT_AMBULATORY_CARE_PROVIDER_SITE_OTHER): Payer: BC Managed Care – PPO | Admitting: Gastroenterology

## 2012-12-23 VITALS — BP 112/74 | HR 80 | Ht 64.0 in | Wt 264.1 lb

## 2012-12-23 DIAGNOSIS — K7689 Other specified diseases of liver: Secondary | ICD-10-CM | POA: Insufficient documentation

## 2012-12-23 DIAGNOSIS — R7989 Other specified abnormal findings of blood chemistry: Secondary | ICD-10-CM

## 2012-12-23 DIAGNOSIS — R945 Abnormal results of liver function studies: Secondary | ICD-10-CM | POA: Insufficient documentation

## 2012-12-23 LAB — FERRITIN: Ferritin: 37.9 ng/mL (ref 10.0–291.0)

## 2012-12-23 NOTE — Progress Notes (Signed)
History of Present Illness: 22 year old white female referred for evaluation of abnormal liver tests.  Incidentally noted were and some elevation including AST 62 and ALT 94.  Alk phosphatase and bilirubin were normal.  Patient has no history of liver disease, hepatitis, transfusions or IV drug use.  She does have tattoos.  She has a history of a goiter and takes Synthroid.    Past Medical History  Diagnosis Date  . Hypothyroidism     Synthroid  - once daily   . Panic attacks     not treated  . Anxiety    Past Surgical History  Procedure Laterality Date  . Wisdom tooth extraction    . Mirena      Inserted 11-04-12   family history includes Cancer in her maternal grandmother; Colon polyps in her maternal grandmother; Heart disease in her father, maternal grandfather, and paternal grandfather; Kidney disease in her father. Current Outpatient Prescriptions  Medication Sig Dispense Refill  . Cyanocobalamin (VITAMIN B-12 PO) Take by mouth.      . levothyroxine (SYNTHROID, LEVOTHROID) 100 MCG tablet Take 1 tablet (100 mcg total) by mouth daily before breakfast.  30 tablet  1   Current Facility-Administered Medications  Medication Dose Route Frequency Provider Last Rate Last Dose  . levonorgestrel (MIRENA) 20 MCG/24HR IUD   Intrauterine Once Dara Lords, MD       Allergies as of 12/23/2012  . (No Known Allergies)    reports that she has never smoked. She has never used smokeless tobacco. She reports that she drinks alcohol. She reports that she does not use illicit drugs.     Review of Systems: Pertinent positive and negative review of systems were noted in the above HPI section. All other review of systems were otherwise negative.  Vital signs were reviewed in today's medical record Physical Exam: General: Obese female in no acute distress Skin: anicteric Head: Normocephalic and atraumatic Eyes:  sclerae anicteric, EOMI Ears: Normal auditory acuity Mouth: No deformity or  lesions Neck: Supple, no masses or thyromegaly Lungs: Clear throughout to auscultation Heart: Regular rate and rhythm; no murmurs, rubs or bruits Abdomen: Soft, non tender and non distended. No masses, hepatosplenomegaly or hernias noted. Normal Bowel sounds Rectal:deferred Musculoskeletal: Symmetrical with no gross deformities  Skin: No lesions on visible extremities Pulses:  Normal pulses noted Extremities: No clubbing, cyanosis, edema or deformities noted Neurological: Alert oriented x 4, grossly nonfocal Cervical Nodes:  No significant cervical adenopathy Inguinal Nodes: No significant inguinal adenopathy Psychological:  Alert and cooperative. Normal mood and affect

## 2012-12-23 NOTE — Assessment & Plan Note (Signed)
Mild LFT abnormalities are probably 2 to hepatic steatosis.  Other causes of chronic liver disease should be ruled out.  Recommendations #1 check iron, TIBC, ferritin, ceruloplasmin level, alpha-1 antitrypsin level, AMA, ANA #2 abdominal ultrasound

## 2012-12-23 NOTE — Patient Instructions (Signed)
Go to the basement for labs today  You have been scheduled for an abdominal ultrasound at [facility] (1st floor of hospital) on 12/25/2012* at 9am. Please arrive 15 minutes prior to your appointment for registration. Make certain not to have anything to eat or drink 6 hours prior to your appointment. Should you need to reschedule your appointment, please contact radiology at [phone]. This test typically takes about 30 minutes to perform.

## 2012-12-24 LAB — ANA: Anti Nuclear Antibody(ANA): NEGATIVE

## 2012-12-25 ENCOUNTER — Ambulatory Visit (HOSPITAL_COMMUNITY): Payer: BC Managed Care – PPO

## 2012-12-25 LAB — CERULOPLASMIN: Ceruloplasmin: 32 mg/dL (ref 20–60)

## 2012-12-25 LAB — MITOCHONDRIAL ANTIBODIES: Mitochondrial M2 Ab, IgG: 0.76 (ref <0.91)

## 2013-01-06 ENCOUNTER — Ambulatory Visit (INDEPENDENT_AMBULATORY_CARE_PROVIDER_SITE_OTHER): Payer: BC Managed Care – PPO | Admitting: Family Medicine

## 2013-01-06 VITALS — BP 134/85 | HR 110 | Temp 97.7°F | Wt 263.6 lb

## 2013-01-06 DIAGNOSIS — R110 Nausea: Secondary | ICD-10-CM

## 2013-01-06 MED ORDER — PROMETHAZINE HCL 25 MG PO TABS: 25.0000 mg | ORAL_TABLET | Freq: Three times a day (TID) | ORAL | Status: DC | PRN

## 2013-01-06 NOTE — Progress Notes (Signed)
   Subjective:    Patient ID: Diana Avery, female    DOB: [birth year], 22 y.o.   MRN: [id number]  HPI This 22 y.o. female presents for evaluation of N/V this am around 0100.  She denies fever. She is unable to hold down fluids.  Review of Systems No chest pain, SOB, HA, dizziness, vision change, N/V, diarrhea, constipation, dysuria, urinary urgency or frequency, myalgias, arthralgias or rash.     Objective:   Physical Exam Vital signs noted  Well developed well nourished female.  HEENT - Head atraumatic Normocephalic                Eyes - PERRLA, Conjuctiva - clear Sclera- Clear EOMI                Ears - EAC's Wnl TM's Wnl Gross Hearing WNL                Throat - oropharanx wnl Respiratory - Lungs CTA bilateral Cardiac - RRR S1 and S2 without murmur GI - Abdomen soft Nontender and bowel sounds active x 4        Assessment & Plan:  Nausea alone - Plan: promethazine (PHENERGAN) 25 MG tablet Push po fluids, rest, tylenol and motrin otc prn as directed for fever, arthralgias, and myalgias.  Follow up prn if sx's continue or persist.

## 2013-02-06 ENCOUNTER — Telehealth: Payer: Self-pay

## 2013-02-06 ENCOUNTER — Other Ambulatory Visit: Payer: Self-pay | Admitting: Gynecology

## 2013-02-06 MED ORDER — DICLOFENAC POTASSIUM(MIGRAINE) 50 MG PO PACK: PACK | ORAL | Status: DC

## 2013-02-06 NOTE — Addendum Note (Signed)
Addended by: Thamas Jaegers on: 02/06/2013 03:06 PM   Modules accepted: Orders, Medications

## 2013-02-06 NOTE — Telephone Encounter (Signed)
Patient advised. She asked could she not go see her PCP about her headaches instead of a specialist. I told her she could do whatever she would like this was Dr. Zelphia Cairo recommendation.  I told her to let us know if she would like help getting scheduled for an appointment. Rx sent.

## 2013-02-06 NOTE — Telephone Encounter (Signed)
Patient complaining of "headache again".  "pretty severe" x 2 days.  Tylenol and Ibuprofen are no help.  When she called with headache back in November you prescribed "Cambia 50 mg packet. #5, one by mouth with headache"  Patient is asking if you could prescribe this for her again?

## 2013-02-06 NOTE — Telephone Encounter (Signed)
Cambia 50 mg #5 ok but I would also recommend appointment to see Dr. Michel Santee, neurologist for evaluation and longer-term treatment plan

## 2013-02-07 ENCOUNTER — Telehealth: Payer: Self-pay | Admitting: *Deleted

## 2013-02-07 NOTE — Telephone Encounter (Signed)
PA FORM FILLED OUT AND FXED TO [employer] FOR CAMBIA 50 MG PACK, WILL WAIT FOR RESPONSE.

## 2013-02-10 NOTE — Telephone Encounter (Signed)
rx was approved for the below.

## 2013-02-11 ENCOUNTER — Telehealth: Payer: Self-pay | Admitting: *Deleted

## 2013-02-12 MED ORDER — TOLNAFTATE 1 % EX AERP: INHALATION_SPRAY | Freq: Two times a day (BID) | CUTANEOUS | Status: DC

## 2013-02-12 NOTE — Telephone Encounter (Signed)
PT NOTIFIED  

## 2013-02-12 NOTE — Telephone Encounter (Signed)
rx ready for pick up- for her flex spending

## 2013-02-12 NOTE — Telephone Encounter (Signed)
PT LAST SEEN 2/14.

## 2013-02-21 ENCOUNTER — Other Ambulatory Visit: Payer: Self-pay | Admitting: Gynecology

## 2013-03-24 ENCOUNTER — Other Ambulatory Visit: Payer: Self-pay | Admitting: Gynecology

## 2013-04-04 ENCOUNTER — Telehealth: Payer: Self-pay | Admitting: General Practice

## 2013-04-04 NOTE — Telephone Encounter (Signed)
Patient told to make sure she drinks plenty of fluids and if no better she can come in the office tomorrow am

## 2013-04-05 ENCOUNTER — Ambulatory Visit (INDEPENDENT_AMBULATORY_CARE_PROVIDER_SITE_OTHER): Payer: BC Managed Care – PPO | Admitting: Nurse Practitioner

## 2013-04-05 ENCOUNTER — Encounter: Payer: Self-pay | Admitting: Nurse Practitioner

## 2013-04-05 VITALS — BP 124/80 | HR 84 | Temp 97.4°F | Ht 64.0 in | Wt 252.6 lb

## 2013-04-05 DIAGNOSIS — N390 Urinary tract infection, site not specified: Secondary | ICD-10-CM

## 2013-04-05 DIAGNOSIS — A084 Viral intestinal infection, unspecified: Secondary | ICD-10-CM

## 2013-04-05 DIAGNOSIS — A088 Other specified intestinal infections: Secondary | ICD-10-CM

## 2013-04-05 DIAGNOSIS — R350 Frequency of micturition: Secondary | ICD-10-CM

## 2013-04-05 LAB — POCT URINALYSIS DIPSTICK
BILIRUBIN UA: NEGATIVE
Glucose, UA: NEGATIVE
Ketones, UA: NEGATIVE
Nitrite, UA: NEGATIVE
PROTEIN UA: NEGATIVE
Specific Gravity: 1.02
Urobilinogen, UA: NEGATIVE
pH, UA: 6

## 2013-04-05 LAB — POCT UA - MICROSCOPIC ONLY
Bacteria, U Microscopic: NEGATIVE
CRYSTALS, UR, HPF, POC: NEGATIVE
Casts, Ur, LPF, POC: NEGATIVE
Mucus, UA: NEGATIVE
Yeast, UA: NEGATIVE

## 2013-04-05 MED ORDER — CIPROFLOXACIN HCL 500 MG PO TABS: 500.0000 mg | ORAL_TABLET | Freq: Two times a day (BID) | ORAL | Status: DC

## 2013-04-05 NOTE — Progress Notes (Signed)
   Subjective:    Patient ID: Diana Avery, female    DOB: 02-26-1990, 23 y.o.   MRN: [id number]  HPI Patient presents today complaining of nausea and vomiting x 3 days. Associated symptoms include diarrhea, abdominal pain, frequency, and change in appetitie. Denies any fever, blood in stool, dysuria, chills, and hematuria. Treatment tried includes Imodium AD and Promethazine with little relief.    Review of Systems  Constitutional: Positive for appetite change. Negative for fever and chills.  Respiratory: Negative for shortness of breath.   Cardiovascular: Negative for chest pain.  Gastrointestinal: Positive for nausea, vomiting, abdominal pain and diarrhea. Negative for blood in stool.  Genitourinary: Positive for urgency and frequency. Negative for dysuria, hematuria and flank pain.  All other systems reviewed and are negative.       Objective:   Physical Exam  Constitutional: She is oriented to person, place, and time.  Cardiovascular: Normal rate, regular rhythm and normal heart sounds.   Pulmonary/Chest: Effort normal and breath sounds normal.  Abdominal: Soft. Bowel sounds are normal. She exhibits no distension and no mass. There is tenderness in the left lower quadrant. There is no rebound and no CVA tenderness.  Genitourinary:     Neurological: She is alert and oriented to person, place, and time.  Skin: Skin is warm and dry.  Psychiatric: She has a normal mood and affect. Her behavior is normal. Judgment and thought content normal.    BP 124/80  Pulse 84  Temp(Src) 97.4 F (36.3 C) (Oral)  Ht 5\' 4"  (1.626 m)  Wt 252 lb 9.6 oz (114.579 kg)  BMI 43.34 kg/m2  LMP 03/29/2013       Assessment & Plan:   1. Urinary frequency   2. Infection of urinary tract   3. Viral gastroenteritis    Orders Placed This Encounter  Procedures  . POCT UA - Microscopic Only  . POCT urinalysis dipstick   Meds ordered this encounter  Medications  . ciprofloxacin (CIPRO) 500  MG tablet    Sig: Take 1 tablet (500 mg total) by mouth 2 (two) times daily.    Dispense:  6 tablet    Refill:  0    Order Specific Question:  Supervising Provider    Answer:  Chipper Herb [[id number]]   First 24 Hours-Clear liquids  popsicles  Jello  gatorade  Sprite Second 24 hours-Add Full liquids ( Liquids you cant see through) Third 24 hours- Bland diet ( foods that are baked or broiled)  *avoiding fried foods and highly spiced foods* During these 3 days  Avoid milk, cheese, ice cream or any other dairy products  Avoid caffeine- REMEMBER Mt. Dew and Mello Yellow contain lots of caffeine You should eat and drink in  Frequent small volumes If no improvement in symptoms or worsen in 2-3 days should RETRUN TO OFFICE or go to ER!     Force fluids AZO over the counter X2 days RTO prn Culture pending  Mary-Margaret Hassell Done, FNP

## 2013-04-05 NOTE — Patient Instructions (Signed)

## 2013-04-22 ENCOUNTER — Telehealth: Payer: Self-pay | Admitting: Nurse Practitioner

## 2013-04-23 MED ORDER — FLUTICASONE PROPIONATE 50 MCG/ACT NA SUSP: 2.0000 | Freq: Every day | NASAL | Status: DC

## 2013-04-23 MED ORDER — CETIRIZINE HCL 10 MG PO TABS: 10.0000 mg | ORAL_TABLET | Freq: Every day | ORAL | Status: DC

## 2013-04-23 MED ORDER — CHLORPHEN-PE-ACETAMINOPHEN 4-10-325 MG PO TABS: 1.0000 | ORAL_TABLET | Freq: Two times a day (BID) | ORAL | Status: DC | PRN

## 2013-04-23 NOTE — Telephone Encounter (Signed)
rx sent to pharmacy

## 2013-08-07 ENCOUNTER — Ambulatory Visit (INDEPENDENT_AMBULATORY_CARE_PROVIDER_SITE_OTHER): Payer: BC Managed Care – PPO | Admitting: Family Medicine

## 2013-08-07 ENCOUNTER — Telehealth: Payer: Self-pay | Admitting: Nurse Practitioner

## 2013-08-07 ENCOUNTER — Encounter: Payer: Self-pay | Admitting: Family Medicine

## 2013-08-07 VITALS — BP 125/79 | HR 84 | Temp 97.6°F | Ht 64.0 in | Wt 265.0 lb

## 2013-08-07 DIAGNOSIS — H65199 Other acute nonsuppurative otitis media, unspecified ear: Secondary | ICD-10-CM

## 2013-08-07 DIAGNOSIS — H65192 Other acute nonsuppurative otitis media, left ear: Secondary | ICD-10-CM

## 2013-08-07 MED ORDER — AMOXICILLIN 875 MG PO TABS: 875.0000 mg | ORAL_TABLET | Freq: Two times a day (BID) | ORAL | Status: DC

## 2013-08-07 MED ORDER — FLUCONAZOLE 150 MG PO TABS: 150.0000 mg | ORAL_TABLET | Freq: Once | ORAL | Status: DC

## 2013-08-07 MED ORDER — ANTIPYRINE-BENZOCAINE 5.4-1.4 % OT SOLN: 3.0000 [drp] | OTIC | Status: DC | PRN

## 2013-08-07 NOTE — Telephone Encounter (Signed)
You will need to call your dentist that did the dental work

## 2013-08-07 NOTE — Telephone Encounter (Signed)
Pt said she was seen by dentist today and he said not from dental work. Needs to see her family doctor. Pt said she had already made an appt with [place] to be seen tonight.

## 2013-08-11 NOTE — Progress Notes (Signed)
   Subjective:    Patient ID: Diana Avery, female    DOB: [birth year], 23 y.o.   MRN: [id number]  HPI  This 23 y.o. female presents for evaluation of left ear pain and uri sx's.  Review of Systems C/o left ear pain No chest pain, SOB, HA, dizziness, vision change, N/V, diarrhea, constipation, dysuria, urinary urgency or frequency, myalgias, arthralgias or rash.     Objective:   Physical Exam  Vital signs noted  Well developed well nourished female.  HEENT - Head atraumatic Normocephalic                Eyes - PERRLA, Conjuctiva - clear Sclera- Clear EOMI                Ears - EAC's Wnl TM injected AS and AD TM  Wnl Gross Hearing WNL                Nose - Nares patent                 Throat - oropharanx wnl Respiratory - Lungs CTA bilateral Cardiac - RRR S1 and S2 without murmur GI - Abdomen soft Nontender and bowel sounds active x 4 Extremities - No edema. Neuro - Grossly intact.      Assessment & Plan:  Acute nonsuppurative otitis media of left ear - Plan: antipyrine-benzocaine (AURALGAN) otic solution, amoxicillin (AMOXIL) 875 MG tablet, fluconazole (DIFLUCAN) 150 MG tablet  Push po fluids, rest, tylenol and motrin otc prn as directed for fever, arthralgias, and myalgias.  Follow up prn if sx's continue or persist.  Lysbeth Penner FNP

## 2013-08-13 ENCOUNTER — Encounter: Payer: Self-pay | Admitting: Gynecology

## 2013-08-13 ENCOUNTER — Ambulatory Visit (INDEPENDENT_AMBULATORY_CARE_PROVIDER_SITE_OTHER): Payer: BC Managed Care – PPO | Admitting: Gynecology

## 2013-08-13 VITALS — BP 122/72 | Ht 64.0 in | Wt 261.0 lb

## 2013-08-13 DIAGNOSIS — E038 Other specified hypothyroidism: Secondary | ICD-10-CM

## 2013-08-13 DIAGNOSIS — Z01419 Encounter for gynecological examination (general) (routine) without abnormal findings: Secondary | ICD-10-CM

## 2013-08-13 LAB — CBC WITH DIFFERENTIAL/PLATELET
Basophils Absolute: 0 10*3/uL (ref 0.0–0.1)
Basophils Relative: 0 % (ref 0–1)
EOS ABS: 0.5 10*3/uL (ref 0.0–0.7)
Eosinophils Relative: 5 % (ref 0–5)
HCT: 38.4 % (ref 36.0–46.0)
Hemoglobin: 13.1 g/dL (ref 12.0–15.0)
Lymphocytes Relative: 25 % (ref 12–46)
Lymphs Abs: 2.5 10*3/uL (ref 0.7–4.0)
MCH: 27.8 pg (ref 26.0–34.0)
MCHC: 34.1 g/dL (ref 30.0–36.0)
MCV: 81.4 fL (ref 78.0–100.0)
MONOS PCT: 8 % (ref 3–12)
Monocytes Absolute: 0.8 10*3/uL (ref 0.1–1.0)
NEUTROS PCT: 62 % (ref 43–77)
Neutro Abs: 6.2 10*3/uL (ref 1.7–7.7)
PLATELETS: 442 10*3/uL — AB (ref 150–400)
RBC: 4.72 MIL/uL (ref 3.87–5.11)
RDW: 14 % (ref 11.5–15.5)
WBC: 10 10*3/uL (ref 4.0–10.5)

## 2013-08-13 LAB — COMPREHENSIVE METABOLIC PANEL
ALBUMIN: 4.2 g/dL (ref 3.5–5.2)
ALT: 58 U/L — ABNORMAL HIGH (ref 0–35)
AST: 36 U/L (ref 0–37)
Alkaline Phosphatase: 51 U/L (ref 39–117)
BUN: 13 mg/dL (ref 6–23)
CALCIUM: 8.6 mg/dL (ref 8.4–10.5)
CHLORIDE: 104 meq/L (ref 96–112)
CO2: 24 mEq/L (ref 19–32)
Creat: 0.64 mg/dL (ref 0.50–1.10)
Glucose, Bld: 92 mg/dL (ref 70–99)
POTASSIUM: 4.5 meq/L (ref 3.5–5.3)
Sodium: 138 mEq/L (ref 135–145)
Total Bilirubin: 0.4 mg/dL (ref 0.2–1.2)
Total Protein: 7.1 g/dL (ref 6.0–8.3)

## 2013-08-13 LAB — LIPID PANEL
CHOLESTEROL: 168 mg/dL (ref 0–200)
HDL: 37 mg/dL — ABNORMAL LOW (ref >39)
LDL Cholesterol: 107 mg/dL — ABNORMAL HIGH (ref 0–99)
Total CHOL/HDL Ratio: 4.5 Ratio
Triglycerides: 118 mg/dL (ref <150)
VLDL: 24 mg/dL (ref 0–40)

## 2013-08-13 LAB — TSH: TSH: 7.686 u[IU]/mL — AB (ref 0.350–4.500)

## 2013-08-13 NOTE — Patient Instructions (Signed)
Followup in one year for annual exam, sooner if any issues.  You may obtain a copy of any labs that were done today by logging onto MyChart as outlined in the instructions provided with your AVS (after visit summary). The office will not call with normal lab results but certainly if there are any significant abnormalities then we will contact you.   Health Maintenance, Female A healthy lifestyle and preventative care can promote health and wellness.  Maintain regular health, dental, and eye exams.  Eat a healthy diet. Foods like vegetables, fruits, whole grains, low-fat dairy products, and lean protein foods contain the nutrients you need without too many calories. Decrease your intake of foods high in solid fats, added sugars, and salt. Get information about a proper diet from your caregiver, if necessary.  Regular physical exercise is one of the most important things you can do for your health. Most adults should get at least 150 minutes of moderate-intensity exercise (any activity that increases your heart rate and causes you to sweat) each week. In addition, most adults need muscle-strengthening exercises on 2 or more days a week.   Maintain a healthy weight. The body mass index (BMI) is a screening tool to identify possible weight problems. It provides an estimate of body fat based on height and weight. Your caregiver can help determine your BMI, and can help you achieve or maintain a healthy weight. For adults 20 years and older:  A BMI below 18.5 is considered underweight.  A BMI of 18.5 to 24.9 is normal.  A BMI of 25 to 29.9 is considered overweight.  A BMI of 30 and above is considered obese.  Maintain normal blood lipids and cholesterol by exercising and minimizing your intake of saturated fat. Eat a balanced diet with plenty of fruits and vegetables. Blood tests for lipids and cholesterol should begin at age 20 and be repeated every 5 years. If your lipid or cholesterol levels are  high, you are over 50, or you are a high risk for heart disease, you may need your cholesterol levels checked more frequently.Ongoing high lipid and cholesterol levels should be treated with medicines if diet and exercise are not effective.  If you smoke, find out from your caregiver how to quit. If you do not use tobacco, do not start.  Lung cancer screening is recommended for adults aged 55 80 years who are at high risk for developing lung cancer because of a history of smoking. Yearly low-dose computed tomography (CT) is recommended for people who have at least a 30-pack-year history of smoking and are a current smoker or have quit within the past 15 years. A pack year of smoking is smoking an average of 1 pack of cigarettes a day for 1 year (for example: 1 pack a day for 30 years or 2 packs a day for 15 years). Yearly screening should continue until the smoker has stopped smoking for at least 15 years. Yearly screening should also be stopped for people who develop a health problem that would prevent them from having lung cancer treatment.  If you are pregnant, do not drink alcohol. If you are breastfeeding, be very cautious about drinking alcohol. If you are not pregnant and choose to drink alcohol, do not exceed 1 drink per day. One drink is considered to be 12 ounces (355 mL) of beer, 5 ounces (148 mL) of wine, or 1.5 ounces (44 mL) of liquor.  Avoid use of street drugs. Do not share needles with   anyone. Ask for help if you need support or instructions about stopping the use of drugs.  High blood pressure causes heart disease and increases the risk of stroke. Blood pressure should be checked at least every 1 to 2 years. Ongoing high blood pressure should be treated with medicines, if weight loss and exercise are not effective.  If you are 55 to 24 years old, ask your caregiver if you should take aspirin to prevent strokes.  Diabetes screening involves taking a blood sample to check your fasting  blood sugar level. This should be done once every 3 years, after age 45, if you are within normal weight and without risk factors for diabetes. Testing should be considered at a younger age or be carried out more frequently if you are overweight and have at least 1 risk factor for diabetes.  Breast cancer screening is essential preventative care for women. You should practice "breast self-awareness." This means understanding the normal appearance and feel of your breasts and may include breast self-examination. Any changes detected, no matter how small, should be reported to a caregiver. Women in their 20s and 30s should have a clinical breast exam (CBE) by a caregiver as part of a regular health exam every 1 to 3 years. After age 40, women should have a CBE every year. Starting at age 40, women should consider having a mammogram (breast X-ray) every year. Women who have a family history of breast cancer should talk to their caregiver about genetic screening. Women at a high risk of breast cancer should talk to their caregiver about having an MRI and a mammogram every year.  Breast cancer gene (BRCA)-related cancer risk assessment is recommended for women who have family members with BRCA-related cancers. BRCA-related cancers include breast, ovarian, tubal, and peritoneal cancers. Having family members with these cancers may be associated with an increased risk for harmful changes (mutations) in the breast cancer genes BRCA1 and BRCA2. Results of the assessment will determine the need for genetic counseling and BRCA1 and BRCA2 testing.  The Pap test is a screening test for cervical cancer. Women should have a Pap test starting at age 21. Between ages 21 and 29, Pap tests should be repeated every 2 years. Beginning at age 30, you should have a Pap test every 3 years as long as the past 3 Pap tests have been normal. If you had a hysterectomy for a problem that was not cancer or a condition that could lead to  cancer, then you no longer need Pap tests. If you are between ages 65 and 70, and you have had normal Pap tests going back 10 years, you no longer need Pap tests. If you have had past treatment for cervical cancer or a condition that could lead to cancer, you need Pap tests and screening for cancer for at least 20 years after your treatment. If Pap tests have been discontinued, risk factors (such as a new sexual partner) need to be reassessed to determine if screening should be resumed. Some women have medical problems that increase the chance of getting cervical cancer. In these cases, your caregiver may recommend more frequent screening and Pap tests.  The human papillomavirus (HPV) test is an additional test that may be used for cervical cancer screening. The HPV test looks for the virus that can cause the cell changes on the cervix. The cells collected during the Pap test can be tested for HPV. The HPV test could be used to screen women aged 30   years and older, and should be used in women of any age who have unclear Pap test results. After the age of 30, women should have HPV testing at the same frequency as a Pap test.  Colorectal cancer can be detected and often prevented. Most routine colorectal cancer screening begins at the age of 50 and continues through age 75. However, your caregiver may recommend screening at an earlier age if you have risk factors for colon cancer. On a yearly basis, your caregiver may provide home test kits to check for hidden blood in the stool. Use of a small camera at the end of a tube, to directly examine the colon (sigmoidoscopy or colonoscopy), can detect the earliest forms of colorectal cancer. Talk to your caregiver about this at age 50, when routine screening begins. Direct examination of the colon should be repeated every 5 to 10 years through age 75, unless early forms of pre-cancerous polyps or small growths are found.  Hepatitis C blood testing is recommended for  all people born from 1945 through 1965 and any individual with known risks for hepatitis C.  Practice safe sex. Use condoms and avoid high-risk sexual practices to reduce the spread of sexually transmitted infections (STIs). Sexually active women aged 25 and younger should be checked for Chlamydia, which is a common sexually transmitted infection. Older women with new or multiple partners should also be tested for Chlamydia. Testing for other STIs is recommended if you are sexually active and at increased risk.  Osteoporosis is a disease in which the bones lose minerals and strength with aging. This can result in serious bone fractures. The risk of osteoporosis can be identified using a bone density scan. Women ages 65 and over and women at risk for fractures or osteoporosis should discuss screening with their caregivers. Ask your caregiver whether you should be taking a calcium supplement or vitamin D to reduce the rate of osteoporosis.  Menopause can be associated with physical symptoms and risks. Hormone replacement therapy is available to decrease symptoms and risks. You should talk to your caregiver about whether hormone replacement therapy is right for you.  Use sunscreen. Apply sunscreen liberally and repeatedly throughout the day. You should seek shade when your shadow is shorter than you. Protect yourself by wearing long sleeves, pants, a wide-brimmed hat, and sunglasses year round, whenever you are outdoors.  Notify your caregiver of new moles or changes in moles, especially if there is a change in shape or color. Also notify your caregiver if a mole is larger than the size of a pencil eraser.  Stay current with your immunizations. Document Released: 07/25/2010 Document Revised: 05/06/2012 Document Reviewed: 07/25/2010 [employer] Patient Information 2014 [employer].   

## 2013-08-13 NOTE — Progress Notes (Signed)
Diana Avery 1990-03-03 [id number]        23 y.o.  G1P0010 for annual exam.  Doing well without complaints.  Past medical history,surgical history, problem list, medications, allergies, family history and social history were all reviewed and documented as reviewed in the EPIC chart.  ROS:  12 system ROS performed with pertinent positives and negatives included in the history, assessment and plan.   Additional significant findings :  None   Exam: Sharrie Rothman assistant Filed Vitals:   08/13/13 0827  BP: 122/72  Height: 5\' 4"  (1.626 m)  Weight: 261 lb (118.389 kg)   General appearance:  Normal affect, orientation and appearance. Skin: Grossly normal HEENT: Without gross lesions.  No cervical or supraclavicular adenopathy. Thyroid normal.  Lungs:  Clear without wheezing, rales or rhonchi Cardiac: RR, without RMG Abdominal:  Soft, nontender, without masses, guarding, rebound, organomegaly or hernia Breasts:  Examined lying and sitting without masses, retractions, discharge or axillary adenopathy. Pelvic:  Ext/BUS/vagina normal  Cervix normal with IUD string visualized  Uterus anteverted, normal size, shape and contour, midline and mobile nontender   Adnexa  Without masses or tenderness    Anus and perineum  Normal   Rectovaginal  Normal sphincter tone without palpated masses or tenderness.    Assessment/Plan:  23 y.o. G29P0010 female for annual exam without menses, Mirena IUD.   1. Mirena IUD 10/2012. Occasional spotting. Otherwise doing well. IUD strings visualized. Continue to monitor. 2. Breast health. SBE monthly reviewed. 3. Pap smear 05/2011. No Pap smear done today. No history of abnormal Pap smears previously. Repeat Pap smear next year 3 year interval per current screening guidelines. 4. Gardasil series received 5. Health maintenance. Baseline CBC comprehensive metabolic panel lipid profile urinalysis TSH ordered. Had elevated ALT in the past. Also on thyroid replacement.  Followup in one year, sooner as needed.   Note: This document was prepared with digital dictation and possible smart phrase technology. Any transcriptional errors that result from this process are unintentional.   Anastasio Auerbach MD, 8:44 AM 08/13/2013

## 2013-08-14 LAB — URINALYSIS W MICROSCOPIC + REFLEX CULTURE
Bilirubin Urine: NEGATIVE
CRYSTALS: NONE SEEN
Casts: NONE SEEN
Glucose, UA: NEGATIVE mg/dL
KETONES UR: NEGATIVE mg/dL
Leukocytes, UA: NEGATIVE
NITRITE: POSITIVE — AB
PROTEIN: NEGATIVE mg/dL
SPECIFIC GRAVITY, URINE: 1.019 (ref 1.005–1.030)
UROBILINOGEN UA: 0.2 mg/dL (ref 0.0–1.0)
pH: 6.5 (ref 5.0–8.0)

## 2013-08-15 ENCOUNTER — Other Ambulatory Visit: Payer: Self-pay | Admitting: Gynecology

## 2013-08-15 DIAGNOSIS — R3129 Other microscopic hematuria: Secondary | ICD-10-CM

## 2013-08-15 DIAGNOSIS — E039 Hypothyroidism, unspecified: Secondary | ICD-10-CM

## 2013-08-15 MED ORDER — LEVOTHYROXINE SODIUM 112 MCG PO TABS: 112.0000 ug | ORAL_TABLET | Freq: Every day | ORAL | Status: DC

## 2013-08-15 MED ORDER — SULFAMETHOXAZOLE-TMP DS 800-160 MG PO TABS: 1.0000 | ORAL_TABLET | Freq: Two times a day (BID) | ORAL | Status: DC

## 2013-08-16 LAB — URINE CULTURE: Colony Count: >=100000

## 2013-08-30 ENCOUNTER — Other Ambulatory Visit: Payer: Self-pay | Admitting: Nurse Practitioner

## 2013-09-30 ENCOUNTER — Telehealth: Payer: Self-pay

## 2013-09-30 NOTE — Telephone Encounter (Signed)
error 

## 2013-09-30 NOTE — Telephone Encounter (Signed)
Patient had been fostering a child with hopes of adopting but now that child is being returned to family. She wants to come in and discuss with Dr. Loetta Rough removing IUD, losing weight and help with getting pregnant.  Recommended office visit and transferred to appt desk to schedule.

## 2013-10-10 ENCOUNTER — Emergency Department: Payer: Self-pay | Admitting: Emergency Medicine

## 2013-10-14 ENCOUNTER — Encounter: Payer: Self-pay | Admitting: Gynecology

## 2013-10-14 ENCOUNTER — Ambulatory Visit (INDEPENDENT_AMBULATORY_CARE_PROVIDER_SITE_OTHER): Payer: BC Managed Care – PPO | Admitting: Gynecology

## 2013-10-14 DIAGNOSIS — Z3009 Encounter for other general counseling and advice on contraception: Secondary | ICD-10-CM

## 2013-10-14 NOTE — Progress Notes (Signed)
Diana Avery [birth year] [id number]        23 y.o.  G1P0010 presents to discuss timing of having her IUD removed in anticipation of pregnancy.  Past medical history,surgical history, problem list, medications, allergies, family history and social history were all reviewed and documented in the EPIC chart.  Directed ROS with pertinent positives and negatives documented in the history of present illness/assessment and plan.   Assessment/Plan:  23 y.o. G1P0010 with Mirena IUD. The patient and her husband are contemplating pregnancy but are not ready to proceed with it at this point.  She wants to lose some weight before proceeding with pregnancy. We reviewed weight reduction strategies and she did well with [employer] losing 40 pounds previously but unfortunately resumed her bad eating habits and regained her weight. I discussed the benefits of weight loss as far as reducing her risks during the pregnancy as well as increasing her chance of achieving pregnancy noting that she did have some irregularity to her cycles before the Mirena IUD most likely secondary to ovulatory irregularity.  She is going to start the diet as well as exercise. Start on a over-the-counter prenatal vitamin now. Recommended a 2 month window from removing her IUD until she tries.  Follow up when they are ready to proceed with pregnancy for the IUD removal     Anastasio Auerbach MD, 11:11 AM 10/14/2013

## 2013-10-14 NOTE — Patient Instructions (Addendum)
Follow up to have your IUD removed when you are ready to proceed with pregnancy.  Start on an over-the-counter prenatal vitamin now before you try to get pregnant to get all of your vitamin levels normal.

## 2013-10-21 ENCOUNTER — Encounter: Payer: Self-pay | Admitting: Gynecology

## 2013-11-21 ENCOUNTER — Encounter: Payer: Self-pay | Admitting: Nurse Practitioner

## 2013-11-21 ENCOUNTER — Ambulatory Visit (INDEPENDENT_AMBULATORY_CARE_PROVIDER_SITE_OTHER): Payer: BC Managed Care – PPO | Admitting: Nurse Practitioner

## 2013-11-21 VITALS — BP 136/89 | HR 89 | Temp 99.2°F | Ht 64.0 in | Wt 263.2 lb

## 2013-11-21 DIAGNOSIS — R635 Abnormal weight gain: Secondary | ICD-10-CM

## 2013-11-21 DIAGNOSIS — G43909 Migraine, unspecified, not intractable, without status migrainosus: Secondary | ICD-10-CM | POA: Insufficient documentation

## 2013-11-21 DIAGNOSIS — Z0000 Encounter for general adult medical examination without abnormal findings: Secondary | ICD-10-CM

## 2013-11-21 DIAGNOSIS — E039 Hypothyroidism, unspecified: Secondary | ICD-10-CM | POA: Insufficient documentation

## 2013-11-21 DIAGNOSIS — G43011 Migraine without aura, intractable, with status migrainosus: Secondary | ICD-10-CM

## 2013-11-21 DIAGNOSIS — E038 Other specified hypothyroidism: Secondary | ICD-10-CM

## 2013-11-21 DIAGNOSIS — E034 Atrophy of thyroid (acquired): Secondary | ICD-10-CM

## 2013-11-21 LAB — POCT CBC
GRANULOCYTE PERCENT: 69.2 % (ref 37–80)
HEMATOCRIT: 37.5 % — AB (ref 37.7–47.9)
Hemoglobin: 12.3 g/dL (ref 12.2–16.2)
Lymph, poc: 3.6 — AB (ref 0.6–3.4)
MCH, POC: 27.5 pg (ref 27–31.2)
MCHC: 32.9 g/dL (ref 31.8–35.4)
MCV: 83.6 fL (ref 80–97)
MPV: 7.8 fL (ref 0–99.8)
POC GRANULOCYTE: 9.1 — AB (ref 2–6.9)
POC LYMPH %: 27.3 % (ref 10–50)
Platelet Count, POC: 410 10*3/uL (ref 142–424)
RBC: 4.5 M/uL (ref 4.04–5.48)
RDW, POC: 13.2 %
WBC: 13.2 10*3/uL — AB (ref 4.6–10.2)

## 2013-11-21 MED ORDER — PHENTERMINE HCL 37.5 MG PO TABS: 37.5000 mg | ORAL_TABLET | Freq: Every day | ORAL | Status: DC

## 2013-11-21 MED ORDER — DICLOFENAC POTASSIUM(MIGRAINE) 50 MG PO PACK: PACK | ORAL | Status: DC

## 2013-11-21 NOTE — Progress Notes (Signed)
   Subjective:    Patient ID: Diana Avery, female    DOB: 26-Jan-1990, 23 y.o.   MRN: [id number]  HPI Patient in today for annual physical exam. SHe is doing well with several complaints. - migraines- takes cambia powder which works well . -obesity- works out at gym 3x a week and cannot lose weight- watches what she eats- just seems to not be getting anywhere with it.    Review of Systems  Constitutional: Negative.   HENT: Negative.   Respiratory: Negative.   Cardiovascular: Negative.   Genitourinary: Negative.   Neurological: Negative.   Hematological: Negative.   Psychiatric/Behavioral: Negative.   All other systems reviewed and are negative.      Objective:   Physical Exam  Constitutional: She is oriented to person, place, and time. She appears well-developed and well-nourished.  HENT:  Head: Normocephalic.  Right Ear: Hearing, tympanic membrane, external ear and ear canal normal.  Left Ear: Hearing, tympanic membrane, external ear and ear canal normal.  Nose: Nose normal.  Mouth/Throat: Uvula is midline, oropharynx is clear and moist and mucous membranes are normal.  Eyes: Conjunctivae and EOM are normal. Pupils are equal, round, and reactive to light.  Neck: Normal range of motion. Neck supple. No JVD present. No thyromegaly present.  Cardiovascular: Normal rate, normal heart sounds and intact distal pulses.   No murmur heard. Pulmonary/Chest: Effort normal and breath sounds normal. She has no wheezes. She has no rales.  Abdominal: Soft. Bowel sounds are normal. She exhibits no mass. There is tenderness.  Musculoskeletal: Normal range of motion.  Neurological: She is alert and oriented to person, place, and time. She has normal reflexes.  Skin: Skin is warm and dry.  Psychiatric: She has a normal mood and affect. Her behavior is normal. Judgment and thought content normal.    BP 136/89  Pulse 89  Temp(Src) 99.2 F (37.3 C) (Oral)  Ht 5\' 4"  (1.626 m)  Wt  263 lb 3.2 oz (119.387 kg)  BMI 45.16 kg/m2       Assessment & Plan:  1. Annual physical exam  2. Hypothyroidism due to acquired atrophy of thyroid  3. Intractable migraine without aura and with status migrainosus Avoid caffeine - Diclofenac Potassium (CAMBIA) 50 MG PACK; Take one po with headache as directed  Dispense: 5 each; Refill: 0  4. Severe obesity (BMI >= 40) Discussed diet and exercise for person with BMI >25 Will recheck weight in 3-6 months Referral to nutritionalist- here t. Eakerd - phentermine (ADIPEX-P) 37.5 MG tablet; Take 1 tablet (37.5 mg total) by mouth daily before breakfast.  Dispense: 30 tablet; Refill: 2  Labs pending Health maintenance reviewed Diet and exercise encouraged Continue all meds Follow up  In 6 months   Tiro, FNP

## 2013-11-21 NOTE — Patient Instructions (Signed)

## 2013-11-22 LAB — CMP14+EGFR
A/G RATIO: 1.4 (ref 1.1–2.5)
ALBUMIN: 4.4 g/dL (ref 3.5–5.5)
ALK PHOS: 65 IU/L (ref 39–117)
ALT: 59 IU/L — ABNORMAL HIGH (ref 0–32)
AST: 30 IU/L (ref 0–40)
BUN / CREAT RATIO: 14 (ref 8–20)
BUN: 11 mg/dL (ref 6–20)
CO2: 23 mmol/L (ref 18–29)
CREATININE: 0.76 mg/dL (ref 0.57–1.00)
Calcium: 9.1 mg/dL (ref 8.7–10.2)
Chloride: 101 mmol/L (ref 97–108)
GFR, EST AFRICAN AMERICAN: 127 mL/min/{1.73_m2} (ref >59)
GFR, EST NON AFRICAN AMERICAN: 110 mL/min/{1.73_m2} (ref >59)
GLOBULIN, TOTAL: 3.1 g/dL (ref 1.5–4.5)
Glucose: 86 mg/dL (ref 65–99)
Potassium: 4 mmol/L (ref 3.5–5.2)
Sodium: 140 mmol/L (ref 134–144)
Total Protein: 7.5 g/dL (ref 6.0–8.5)

## 2013-11-22 LAB — TSH: TSH: 7 u[IU]/mL — ABNORMAL HIGH (ref 0.450–4.500)

## 2013-11-22 LAB — LIPID PANEL
Chol/HDL Ratio: 5.2 ratio units — ABNORMAL HIGH (ref 0.0–4.4)
Cholesterol, Total: 201 mg/dL — ABNORMAL HIGH (ref 100–199)
HDL: 39 mg/dL — ABNORMAL LOW (ref >39)
LDL Calculated: 123 mg/dL — ABNORMAL HIGH (ref 0–99)
Triglycerides: 197 mg/dL — ABNORMAL HIGH (ref 0–149)
VLDL CHOLESTEROL CAL: 39 mg/dL (ref 5–40)

## 2013-11-23 ENCOUNTER — Other Ambulatory Visit: Payer: Self-pay | Admitting: Nurse Practitioner

## 2013-11-23 MED ORDER — LEVOTHYROXINE SODIUM 125 MCG PO TABS: 125.0000 ug | ORAL_TABLET | Freq: Every day | ORAL | Status: DC

## 2013-11-24 ENCOUNTER — Encounter: Payer: Self-pay | Admitting: Nurse Practitioner

## 2013-11-26 ENCOUNTER — Other Ambulatory Visit (INDEPENDENT_AMBULATORY_CARE_PROVIDER_SITE_OTHER): Payer: BC Managed Care – PPO

## 2013-11-26 DIAGNOSIS — D72829 Elevated white blood cell count, unspecified: Secondary | ICD-10-CM

## 2013-11-26 LAB — POCT CBC
GRANULOCYTE PERCENT: 68.5 % (ref 37–80)
HEMATOCRIT: 39.3 % (ref 37.7–47.9)
Hemoglobin: 12.9 g/dL (ref 12.2–16.2)
LYMPH, POC: 3.4 (ref 0.6–3.4)
MCH, POC: 27.6 pg (ref 27–31.2)
MCHC: 32.9 g/dL (ref 31.8–35.4)
MCV: 84 fL (ref 80–97)
MPV: 7.6 fL (ref 0–99.8)
PLATELET COUNT, POC: 459 10*3/uL — AB (ref 142–424)
POC Granulocyte: 8.6 — AB (ref 2–6.9)
POC LYMPH PERCENT: 26.9 %L (ref 10–50)
RBC: 4.7 M/uL (ref 4.04–5.48)
RDW, POC: 13.1 %
WBC: 12.5 10*3/uL — AB (ref 4.6–10.2)

## 2013-12-12 ENCOUNTER — Encounter: Payer: Self-pay | Admitting: Pharmacist

## 2013-12-12 ENCOUNTER — Ambulatory Visit (INDEPENDENT_AMBULATORY_CARE_PROVIDER_SITE_OTHER): Payer: BC Managed Care – PPO | Admitting: Pharmacist

## 2013-12-12 DIAGNOSIS — E038 Other specified hypothyroidism: Secondary | ICD-10-CM

## 2013-12-12 DIAGNOSIS — E782 Mixed hyperlipidemia: Secondary | ICD-10-CM

## 2013-12-12 DIAGNOSIS — E034 Atrophy of thyroid (acquired): Secondary | ICD-10-CM

## 2013-12-12 DIAGNOSIS — K760 Fatty (change of) liver, not elsewhere classified: Secondary | ICD-10-CM

## 2013-12-12 NOTE — Progress Notes (Signed)
Subjective:     Diana Avery is a 23 y.o. female here for discussion regarding weight loss. She has always been overweight but when she was 23yo she received her first dose of DepoProvera and gained 40#. She feels ideal weight is 150 pounds. History of eating disorders: none. There is a family history positive for obesity in the patient, mother and sister. Previous treatments for obesity include self-directed dieting. Obesity associated medical conditions: amenorrhea, hyperlipidemia, thyroid disease and steatoheaptitis / fatty liver. Obesity associated medications: none. Cardiovascular risk factors besides obesity: dyslipidemia, obesity (BMI >= 30 kg/m2) and sedentary lifestyle.  Patient is interested in having children in the next few years and she desires to be a healthy as possible.  Currently taking phentermine 37.5mg  1/2 tablet qam for last 3 weeks - has lost 8# already.  The following portions of the patient's history were reviewed and updated as appropriate: allergies, current medications, past family history, past medical history, past social history, past surgical history and problem list.  Review of Systems A comprehensive review of systems was negative.    Objective:    Body mass index is 43.75 kg/(m^2). Filed Vitals:   12/12/13 0942  BP: 122/74  Pulse: 80      Assessment:    Obesity. I assessed Diana Avery to be in an action stage with respect to weight loss.   Hyperthyroidism Fatty liver Elevated triglycerides and low HDL Plan:    General weight loss/lifestyle modification strategies discussed (elicit support from others; identify saboteurs; non-food rewards, etc). Behavioral treatment: stress management and discussed "scheduling" workouts to increase compliance. Diet interventions: moderate (500 kCal/d) deficit diet. Discussed increasing non starchy vegetables and lean proteins.  She is to continue to track calories with my Fitness Pal App Regular aerobic exercise  program discussed. Continue work outs - goal of at least 3 times per week work up to 5 days per week Medication: Contnue phentermine 37.5mg  1/2 tablet qam Follow up in: Decemer for labs and January to recheck weight.   Cherre Robins, PharmD, CPP

## 2014-01-05 ENCOUNTER — Other Ambulatory Visit (INDEPENDENT_AMBULATORY_CARE_PROVIDER_SITE_OTHER): Payer: BC Managed Care – PPO

## 2014-01-05 DIAGNOSIS — E782 Mixed hyperlipidemia: Secondary | ICD-10-CM

## 2014-01-05 DIAGNOSIS — E034 Atrophy of thyroid (acquired): Secondary | ICD-10-CM

## 2014-01-05 DIAGNOSIS — K760 Fatty (change of) liver, not elsewhere classified: Secondary | ICD-10-CM

## 2014-01-05 NOTE — Progress Notes (Signed)
Labs only

## 2014-01-06 LAB — THYROID PANEL WITH TSH
Free Thyroxine Index: 2.4 (ref 1.2–4.9)
T3 Uptake Ratio: 28 % (ref 24–39)
T4, Total: 8.6 ug/dL (ref 4.5–12.0)
TSH: 4.19 u[IU]/mL (ref 0.450–4.500)

## 2014-01-06 LAB — HEPATIC FUNCTION PANEL
ALK PHOS: 67 IU/L (ref 39–117)
ALT: 35 IU/L — ABNORMAL HIGH (ref 0–32)
AST: 18 IU/L (ref 0–40)
Albumin: 4.1 g/dL (ref 3.5–5.5)
BILIRUBIN DIRECT: 0.06 mg/dL (ref 0.00–0.40)
Total Bilirubin: 0.2 mg/dL (ref 0.0–1.2)
Total Protein: 6.9 g/dL (ref 6.0–8.5)

## 2014-01-06 LAB — LIPID PANEL
Chol/HDL Ratio: 4.5 ratio units — ABNORMAL HIGH (ref 0.0–4.4)
Cholesterol, Total: 171 mg/dL (ref 100–199)
HDL: 38 mg/dL — AB (ref >39)
LDL CALC: 118 mg/dL — AB (ref 0–99)
Triglycerides: 73 mg/dL (ref 0–149)
VLDL CHOLESTEROL CAL: 15 mg/dL (ref 5–40)

## 2014-01-07 ENCOUNTER — Telehealth: Payer: Self-pay | Admitting: Pharmacist

## 2014-01-07 NOTE — Telephone Encounter (Signed)
All labs have improved with recent dietary changes and weight loss.  Continue same  Recheck in 3 months Patient notified

## 2014-02-12 ENCOUNTER — Ambulatory Visit: Payer: Self-pay

## 2014-05-30 ENCOUNTER — Other Ambulatory Visit: Payer: Self-pay | Admitting: Nurse Practitioner

## 2014-07-17 ENCOUNTER — Ambulatory Visit (INDEPENDENT_AMBULATORY_CARE_PROVIDER_SITE_OTHER): Payer: BLUE CROSS/BLUE SHIELD | Admitting: Family

## 2014-07-17 ENCOUNTER — Encounter: Payer: Self-pay | Admitting: Family

## 2014-07-17 VITALS — BP 132/88 | HR 91 | Temp 97.9°F | Ht 64.0 in | Wt 277.4 lb

## 2014-07-17 DIAGNOSIS — S161XXA Strain of muscle, fascia and tendon at neck level, initial encounter: Secondary | ICD-10-CM | POA: Diagnosis not present

## 2014-07-17 MED ORDER — MELOXICAM 15 MG PO TABS: 15.0000 mg | ORAL_TABLET | Freq: Every day | ORAL | Status: DC

## 2014-07-17 MED ORDER — CYCLOBENZAPRINE HCL 10 MG PO TABS: 10.0000 mg | ORAL_TABLET | Freq: Three times a day (TID) | ORAL | Status: DC | PRN

## 2014-07-17 MED ORDER — METHYLPREDNISOLONE ACETATE 80 MG/ML IJ SUSP
80.0000 mg | Freq: Once | INTRAMUSCULAR | Status: AC
  Administered 2014-07-17: 80 mg via INTRAMUSCULAR

## 2014-07-17 MED ORDER — KETOROLAC TROMETHAMINE 60 MG/2ML IM SOLN
60.0000 mg | Freq: Once | INTRAMUSCULAR | Status: AC
  Administered 2014-07-17: 60 mg via INTRAMUSCULAR

## 2014-07-17 NOTE — Progress Notes (Signed)
   Subjective:    Patient ID: Diana Avery, female    DOB: 03/29/1990, 24 y.o.   MRN: [id number]  Neck Pain  This is a new problem. The current episode started in the past 7 days (Tuesday). The problem occurs constantly. The problem has been waxing and waning. Associated with: Pt went to [place] on Tuesday and has been moving boxes  The pain is present in the anterior neck, left side and right side. The quality of the pain is described as stabbing. The pain is at a severity of 8/10. The pain is moderate. The symptoms are aggravated by bending, sneezing and twisting. Associated symptoms include headaches. Pertinent negatives include no fever, leg pain, photophobia, trouble swallowing, visual change or weakness. She has tried heat and ice for the symptoms. The treatment provided mild relief.      Review of Systems  Constitutional: Negative.  Negative for fever.  HENT: Negative for trouble swallowing.   Eyes: Negative.  Negative for photophobia.  Respiratory: Negative.  Negative for shortness of breath.   Cardiovascular: Negative.  Negative for palpitations.  Gastrointestinal: Negative.   Endocrine: Negative.   Genitourinary: Negative.   Musculoskeletal: Positive for neck pain.  Neurological: Positive for headaches. Negative for weakness.  Hematological: Negative.   Psychiatric/Behavioral: Negative.   All other systems reviewed and are negative.      Objective:   Physical Exam  Constitutional: She is oriented to person, place, and time. She appears well-developed and well-nourished. No distress.  HENT:  Head: Normocephalic and atraumatic.  Right Ear: External ear normal.  Mouth/Throat: Oropharynx is clear and moist.  Eyes: Pupils are equal, round, and reactive to light.  Neck: No thyromegaly present.  Limited ROM of flexion, rotation related to pain    Cardiovascular: Normal rate, regular rhythm, normal heart sounds and intact distal pulses.   No murmur  heard. Pulmonary/Chest: Effort normal and breath sounds normal. No respiratory distress. She has no wheezes.  Abdominal: Soft. Bowel sounds are normal. She exhibits no distension. There is no tenderness.  Musculoskeletal: Normal range of motion. She exhibits no edema or tenderness.  Neurological: She is alert and oriented to person, place, and time. She has normal reflexes. No cranial nerve deficit.  Skin: Skin is warm and dry.  Psychiatric: She has a normal mood and affect. Her behavior is normal. Judgment and thought content normal.  Vitals reviewed.    BP 132/88 mmHg  Pulse 91  Temp(Src) 97.9 F (36.6 C) (Oral)  Ht $R'5\' 4"'gp$  (1.626 m)  Wt 277 lb 6.4 oz (125.828 kg)  BMI 47.59 kg/m2      Assessment & Plan:  1. Neck strain, initial encounter -Rest -No other NSAID"s while on Mobic -Sedation precaution discussed  -RTO prn - methylPREDNISolone acetate (DEPO-MEDROL) injection 80 mg; Inject 1 mL (80 mg total) into the muscle once. - ketorolac (TORADOL) injection 60 mg; Inject 2 mLs (60 mg total) into the muscle once. - cyclobenzaprine (FLEXERIL) 10 MG tablet; Take 1 tablet (10 mg total) by mouth 3 (three) times daily as needed for muscle spasms.  Dispense: 30 tablet; Refill: 0 - meloxicam (MOBIC) 15 MG tablet; Take 1 tablet (15 mg total) by mouth daily.  Dispense: 30 tablet; Refill: 0 - BMP8+EGFR  Evelina Dun, FNP

## 2014-07-17 NOTE — Patient Instructions (Signed)

## 2014-07-18 LAB — BMP8+EGFR
BUN / CREAT RATIO: 14 (ref 8–20)
BUN: 9 mg/dL (ref 6–20)
CHLORIDE: 103 mmol/L (ref 97–108)
CO2: 21 mmol/L (ref 18–29)
Calcium: 8.8 mg/dL (ref 8.7–10.2)
Creatinine, Ser: 0.64 mg/dL (ref 0.57–1.00)
GFR calc non Af Amer: 124 mL/min/{1.73_m2} (ref >59)
GFR, EST AFRICAN AMERICAN: 143 mL/min/{1.73_m2} (ref >59)
GLUCOSE: 124 mg/dL — AB (ref 65–99)
Potassium: 4 mmol/L (ref 3.5–5.2)
Sodium: 140 mmol/L (ref 134–144)

## 2014-08-18 ENCOUNTER — Ambulatory Visit (INDEPENDENT_AMBULATORY_CARE_PROVIDER_SITE_OTHER): Payer: BLUE CROSS/BLUE SHIELD | Admitting: Gynecology

## 2014-08-18 ENCOUNTER — Other Ambulatory Visit (HOSPITAL_COMMUNITY)
Admission: RE | Admit: 2014-08-18 | Discharge: 2014-08-18 | Disposition: A | Discharge status: Home / Self Care | Payer: BLUE CROSS/BLUE SHIELD | Source: Ambulatory Visit | Attending: Gynecology | Admitting: Gynecology

## 2014-08-18 ENCOUNTER — Encounter: Payer: Self-pay | Admitting: Gynecology

## 2014-08-18 VITALS — BP 124/80 | Ht 63.0 in | Wt 274.0 lb

## 2014-08-18 DIAGNOSIS — Z01419 Encounter for gynecological examination (general) (routine) without abnormal findings: Secondary | ICD-10-CM

## 2014-08-18 DIAGNOSIS — Z30432 Encounter for removal of intrauterine contraceptive device: Secondary | ICD-10-CM

## 2014-08-18 DIAGNOSIS — E039 Hypothyroidism, unspecified: Secondary | ICD-10-CM | POA: Diagnosis not present

## 2014-08-18 HISTORY — PX: IUD REMOVAL: SHX5392

## 2014-08-18 MED ORDER — NORGESTIMATE-ETH ESTRADIOL 0.25-35 MG-MCG PO TABS: 1.0000 | ORAL_TABLET | Freq: Every day | ORAL | Status: DC

## 2014-08-18 MED ORDER — LEVOTHYROXINE SODIUM 125 MCG PO TABS: ORAL_TABLET | ORAL | Status: DC

## 2014-08-18 NOTE — Patient Instructions (Signed)
You may obtain a copy of any labs that were done today by logging onto MyChart as outlined in the instructions provided with your AVS (after visit summary). The office will not call with normal lab results but certainly if there are any significant abnormalities then we will contact you.   Health Maintenance, Female A healthy lifestyle and preventative care can promote health and wellness.  Maintain regular health, dental, and eye exams.  Eat a healthy diet. Foods like vegetables, fruits, whole grains, low-fat dairy products, and lean protein foods contain the nutrients you need without too many calories. Decrease your intake of foods high in solid fats, added sugars, and salt. Get information about a proper diet from your caregiver, if necessary.  Regular physical exercise is one of the most important things you can do for your health. Most adults should get at least 150 minutes of moderate-intensity exercise (any activity that increases your heart rate and causes you to sweat) each week. In addition, most adults need muscle-strengthening exercises on 2 or more days a week.   Maintain a healthy weight. The body mass index (BMI) is a screening tool to identify possible weight problems. It provides an estimate of body fat based on height and weight. Your caregiver can help determine your BMI, and can help you achieve or maintain a healthy weight. For adults 20 years and older:  A BMI below 18.5 is considered underweight.  A BMI of 18.5 to 24.9 is normal.  A BMI of 25 to 29.9 is considered overweight.  A BMI of 30 and above is considered obese.  Maintain normal blood lipids and cholesterol by exercising and minimizing your intake of saturated fat. Eat a balanced diet with plenty of fruits and vegetables. Blood tests for lipids and cholesterol should begin at age 61 and be repeated every 5 years. If your lipid or cholesterol levels are high, you are over 50, or you are a high risk for heart  disease, you may need your cholesterol levels checked more frequently.Ongoing high lipid and cholesterol levels should be treated with medicines if diet and exercise are not effective.  If you smoke, find out from your caregiver how to quit. If you do not use tobacco, do not start.  Lung cancer screening is recommended for adults aged 33 80 years who are at high risk for developing lung cancer because of a history of smoking. Yearly low-dose computed tomography (CT) is recommended for people who have at least a 30-pack-year history of smoking and are a current smoker or have quit within the past 15 years. A pack year of smoking is smoking an average of 1 pack of cigarettes a day for 1 year (for example: 1 pack a day for 30 years or 2 packs a day for 15 years). Yearly screening should continue until the smoker has stopped smoking for at least 15 years. Yearly screening should also be stopped for people who develop a health problem that would prevent them from having lung cancer treatment.  If you are pregnant, do not drink alcohol. If you are breastfeeding, be very cautious about drinking alcohol. If you are not pregnant and choose to drink alcohol, do not exceed 1 drink per day. One drink is considered to be 12 ounces (355 mL) of beer, 5 ounces (148 mL) of wine, or 1.5 ounces (44 mL) of liquor.  Avoid use of street drugs. Do not share needles with anyone. Ask for help if you need support or instructions about stopping  the use of drugs.  High blood pressure causes heart disease and increases the risk of stroke. Blood pressure should be checked at least every 1 to 2 years. Ongoing high blood pressure should be treated with medicines, if weight loss and exercise are not effective.  If you are 59 to 25 years old, ask your caregiver if you should take aspirin to prevent strokes.  Diabetes screening involves taking a blood sample to check your fasting blood sugar level. This should be done once every 3  years, after age 91, if you are within normal weight and without risk factors for diabetes. Testing should be considered at a younger age or be carried out more frequently if you are overweight and have at least 1 risk factor for diabetes.  Breast cancer screening is essential preventative care for women. You should practice "breast self-awareness." This means understanding the normal appearance and feel of your breasts and may include breast self-examination. Any changes detected, no matter how small, should be reported to a caregiver. Women in their 66s and 30s should have a clinical breast exam (CBE) by a caregiver as part of a regular health exam every 1 to 3 years. After age 101, women should have a CBE every year. Starting at age 100, women should consider having a mammogram (breast X-ray) every year. Women who have a family history of breast cancer should talk to their caregiver about genetic screening. Women at a high risk of breast cancer should talk to their caregiver about having an MRI and a mammogram every year.  Breast cancer gene (BRCA)-related cancer risk assessment is recommended for women who have family members with BRCA-related cancers. BRCA-related cancers include breast, ovarian, tubal, and peritoneal cancers. Having family members with these cancers may be associated with an increased risk for harmful changes (mutations) in the breast cancer genes BRCA1 and BRCA2. Results of the assessment will determine the need for genetic counseling and BRCA1 and BRCA2 testing.  The Pap test is a screening test for cervical cancer. Women should have a Pap test starting at age 57. Between ages 25 and 35, Pap tests should be repeated every 2 years. Beginning at age 37, you should have a Pap test every 3 years as long as the past 3 Pap tests have been normal. If you had a hysterectomy for a problem that was not cancer or a condition that could lead to cancer, then you no longer need Pap tests. If you are  between ages 50 and 76, and you have had normal Pap tests going back 10 years, you no longer need Pap tests. If you have had past treatment for cervical cancer or a condition that could lead to cancer, you need Pap tests and screening for cancer for at least 20 years after your treatment. If Pap tests have been discontinued, risk factors (such as a new sexual partner) need to be reassessed to determine if screening should be resumed. Some women have medical problems that increase the chance of getting cervical cancer. In these cases, your caregiver may recommend more frequent screening and Pap tests.  The human papillomavirus (HPV) test is an additional test that may be used for cervical cancer screening. The HPV test looks for the virus that can cause the cell changes on the cervix. The cells collected during the Pap test can be tested for HPV. The HPV test could be used to screen women aged 44 years and older, and should be used in women of any age  who have unclear Pap test results. After the age of 55, women should have HPV testing at the same frequency as a Pap test.  Colorectal cancer can be detected and often prevented. Most routine colorectal cancer screening begins at the age of 44 and continues through age 20. However, your caregiver may recommend screening at an earlier age if you have risk factors for colon cancer. On a yearly basis, your caregiver may provide home test kits to check for hidden blood in the stool. Use of a small camera at the end of a tube, to directly examine the colon (sigmoidoscopy or colonoscopy), can detect the earliest forms of colorectal cancer. Talk to your caregiver about this at age 86, when routine screening begins. Direct examination of the colon should be repeated every 5 to 10 years through age 13, unless early forms of pre-cancerous polyps or small growths are found.  Hepatitis C blood testing is recommended for all people born from [birth year] through 1966 and any  individual with known risks for hepatitis C.  Practice safe sex. Use condoms and avoid high-risk sexual practices to reduce the spread of sexually transmitted infections (STIs). Sexually active women aged 36 and younger should be checked for Chlamydia, which is a common sexually transmitted infection. Older women with new or multiple partners should also be tested for Chlamydia. Testing for other STIs is recommended if you are sexually active and at increased risk.  Osteoporosis is a disease in which the bones lose minerals and strength with aging. This can result in serious bone fractures. The risk of osteoporosis can be identified using a bone density scan. Women ages 20 and over and women at risk for fractures or osteoporosis should discuss screening with their caregivers. Ask your caregiver whether you should be taking a calcium supplement or vitamin D to reduce the rate of osteoporosis.  Menopause can be associated with physical symptoms and risks. Hormone replacement therapy is available to decrease symptoms and risks. You should talk to your caregiver about whether hormone replacement therapy is right for you.  Use sunscreen. Apply sunscreen liberally and repeatedly throughout the day. You should seek shade when your shadow is shorter than you. Protect yourself by wearing long sleeves, pants, a wide-brimmed hat, and sunglasses year round, whenever you are outdoors.  Notify your caregiver of new moles or changes in moles, especially if there is a change in shape or color. Also notify your caregiver if a mole is larger than the size of a pencil eraser.  Stay current with your immunizations. Document Released: 07/25/2010 Document Revised: 05/06/2012 Document Reviewed: 07/25/2010 [facility] Patient Information 2014 [employer].

## 2014-08-18 NOTE — Addendum Note (Signed)
Addended by: Nelva Nay on: 08/18/2014 04:28 PM   Modules accepted: Orders

## 2014-08-18 NOTE — Progress Notes (Signed)
Diana Avery 21-Jul-1990 [id number]        24 y.o.  G1P0010 for annual exam.  Several issues noted below.  Past medical history,surgical history, problem list, medications, allergies, family history and social history were all reviewed and documented as reviewed in the EPIC chart.  ROS:  Performed with pertinent positives and negatives included in the history, assessment and plan.   Additional significant findings :  none   Exam: Kim Counsellor Vitals:   08/18/14 1533  BP: 124/80  Height: 5\' 3"  (1.6 m)  Weight: 274 lb (124.286 kg)   General appearance:  Normal affect, orientation and appearance. Skin: Grossly normal HEENT: Without gross lesions.  No cervical or supraclavicular adenopathy. Thyroid normal.  Lungs:  Clear without wheezing, rales or rhonchi Cardiac: RR, without RMG Abdominal:  Soft, nontender, without masses, guarding, rebound, organomegaly or hernia Breasts:  Examined lying and sitting without masses, retractions, discharge or axillary adenopathy. Pelvic:  Ext/BUS/vagina normal  Cervix normal. IUD string visualized. Pap smear done.  Uterus grossly normal size midline mobile nontender exam limited by abdominal girth  Adnexa  Without gross masses or tenderness    Anus and perineum  Normal    Procedure: IUD string was visualized, grasped with the [facility] forcep and her Mirena IUD was removed, shown to the patient and discarded.  Assessment/Plan:  24 y.o. G66P0010 female for annual exam without menses, Mirena IUD.   1. Contraceptive management. Patient wants her IUD removed. Has been having issues with headaches on and off since placement. Plans to use oral contraceptives which she has used in the past. Had tried NuvaRing but did not like it. IUD removed as above. We'll start with Sprintec equivalent. She'll Sunday start now with backup contraception through first pack. Risks to include increased risk of thrombosis reviewed.  Follow up if any issues with  this. 2. Pap smear 2013. Pap smear done today. No history of significant abnormal Pap smears previously. 3. Breast health. SBE monthly reviewed. 4. Hypothyroid. Patient requests check of her TSH and refill of her Synthroid 125 g. TSH ordered refill 1 year provided 5. Health maintenance. She also requests routine lab work. CBC comprehensive metabolic panel lipid profile urinalysis ordered. Follow up if any issues once starting the birth control pills otherwise 1 year for annual exam.   Anastasio Auerbach MD, 4:09 PM 08/18/2014

## 2014-08-19 ENCOUNTER — Encounter: Payer: Self-pay | Admitting: Gynecology

## 2014-08-19 LAB — URINALYSIS W MICROSCOPIC + REFLEX CULTURE
Bacteria, UA: NONE SEEN [HPF]
Bilirubin Urine: NEGATIVE
Casts: NONE SEEN [LPF]
Crystals: NONE SEEN [HPF]
Glucose, UA: NEGATIVE
HGB URINE DIPSTICK: NEGATIVE
Ketones, ur: NEGATIVE
Leukocytes, UA: NEGATIVE
NITRITE: NEGATIVE
Protein, ur: NEGATIVE
SPECIFIC GRAVITY, URINE: 1.019 (ref 1.001–1.035)
YEAST: NONE SEEN [HPF]
pH: 6.5 (ref 5.0–8.0)

## 2014-08-20 ENCOUNTER — Other Ambulatory Visit: Payer: BLUE CROSS/BLUE SHIELD

## 2014-08-20 LAB — COMPREHENSIVE METABOLIC PANEL
ALK PHOS: 74 U/L (ref 33–115)
ALT: 54 U/L — ABNORMAL HIGH (ref 6–29)
AST: 26 U/L (ref 10–30)
Albumin: 4.1 g/dL (ref 3.6–5.1)
BILIRUBIN TOTAL: 0.4 mg/dL (ref 0.2–1.2)
BUN: 12 mg/dL (ref 7–25)
CALCIUM: 9.1 mg/dL (ref 8.6–10.2)
CHLORIDE: 103 meq/L (ref 98–110)
CO2: 27 mEq/L (ref 20–31)
CREATININE: 0.65 mg/dL (ref 0.50–1.10)
Glucose, Bld: 90 mg/dL (ref 65–99)
POTASSIUM: 4.5 meq/L (ref 3.5–5.3)
SODIUM: 139 meq/L (ref 135–146)
Total Protein: 7.3 g/dL (ref 6.1–8.1)

## 2014-08-20 LAB — CBC WITH DIFFERENTIAL/PLATELET
Basophils Absolute: 0 10*3/uL (ref 0.0–0.1)
Basophils Relative: 0 % (ref 0–1)
EOS ABS: 0.2 10*3/uL (ref 0.0–0.7)
Eosinophils Relative: 2 % (ref 0–5)
HCT: 38.9 % (ref 36.0–46.0)
Hemoglobin: 12.8 g/dL (ref 12.0–15.0)
LYMPHS ABS: 3.5 10*3/uL (ref 0.7–4.0)
Lymphocytes Relative: 31 % (ref 12–46)
MCH: 27.6 pg (ref 26.0–34.0)
MCHC: 32.9 g/dL (ref 30.0–36.0)
MCV: 84 fL (ref 78.0–100.0)
MPV: 9.6 fL (ref 8.6–12.4)
Monocytes Absolute: 0.9 10*3/uL (ref 0.1–1.0)
Monocytes Relative: 8 % (ref 3–12)
Neutro Abs: 6.6 10*3/uL (ref 1.7–7.7)
Neutrophils Relative %: 59 % (ref 43–77)
PLATELETS: 434 10*3/uL — AB (ref 150–400)
RBC: 4.63 MIL/uL (ref 3.87–5.11)
RDW: 14.3 % (ref 11.5–15.5)
WBC: 11.2 10*3/uL — AB (ref 4.0–10.5)

## 2014-08-20 LAB — LIPID PANEL
Cholesterol: 170 mg/dL (ref 125–200)
HDL: 44 mg/dL — AB (ref >=46)
LDL Cholesterol: 106 mg/dL (ref <130)
TRIGLYCERIDES: 99 mg/dL (ref <150)
Total CHOL/HDL Ratio: 3.9 Ratio (ref <=5.0)
VLDL: 20 mg/dL (ref <30)

## 2014-08-20 LAB — TSH: TSH: 6.798 u[IU]/mL — ABNORMAL HIGH (ref 0.350–4.500)

## 2014-08-20 LAB — CYTOLOGY - PAP

## 2014-08-21 ENCOUNTER — Other Ambulatory Visit: Payer: Self-pay | Admitting: Gynecology

## 2014-08-21 DIAGNOSIS — R7989 Other specified abnormal findings of blood chemistry: Secondary | ICD-10-CM

## 2014-08-21 MED ORDER — LEVOTHYROXINE SODIUM 137 MCG PO TABS: 137.0000 ug | ORAL_TABLET | Freq: Every day | ORAL | Status: DC

## 2014-10-08 ENCOUNTER — Encounter: Payer: Self-pay | Admitting: Family

## 2014-10-08 ENCOUNTER — Ambulatory Visit (INDEPENDENT_AMBULATORY_CARE_PROVIDER_SITE_OTHER): Payer: BLUE CROSS/BLUE SHIELD | Admitting: Family

## 2014-10-08 VITALS — BP 138/91 | HR 84 | Temp 98.2°F | Ht 63.0 in | Wt 281.4 lb

## 2014-10-08 DIAGNOSIS — F32A Depression, unspecified: Secondary | ICD-10-CM | POA: Insufficient documentation

## 2014-10-08 DIAGNOSIS — F329 Major depressive disorder, single episode, unspecified: Secondary | ICD-10-CM | POA: Diagnosis not present

## 2014-10-08 DIAGNOSIS — F411 Generalized anxiety disorder: Secondary | ICD-10-CM | POA: Insufficient documentation

## 2014-10-08 DIAGNOSIS — E038 Other specified hypothyroidism: Secondary | ICD-10-CM | POA: Diagnosis not present

## 2014-10-08 DIAGNOSIS — E034 Atrophy of thyroid (acquired): Secondary | ICD-10-CM

## 2014-10-08 DIAGNOSIS — Z6841 Body Mass Index (BMI) 40.0 and over, adult: Secondary | ICD-10-CM

## 2014-10-08 MED ORDER — CITALOPRAM HYDROBROMIDE 20 MG PO TABS: 20.0000 mg | ORAL_TABLET | Freq: Every day | ORAL | Status: DC

## 2014-10-08 MED ORDER — ALPRAZOLAM 0.5 MG PO TABS: 0.5000 mg | ORAL_TABLET | Freq: Two times a day (BID) | ORAL | Status: DC | PRN

## 2014-10-08 NOTE — Progress Notes (Signed)
Subjective:    Patient ID: Diana Avery, female    DOB: Mar 08, 1990, 24 y.o.   MRN: [id number]  Pt presents to the office today for panic attacks, GAD, and depression. Pt states she is in the middle of an adoption, selling her house, buying a new home. Pt states she is stressed and just feels overwhelmed and helpless.  Anxiety Presents for initial visit. Onset was 1 to 6 months ago. The problem has been waxing and waning. Symptoms include depressed mood, excessive worry, insomnia, irritability, nervous/anxious behavior, panic and restlessness. Patient reports no palpitations, shortness of breath or suicidal ideas. Symptoms occur most days. The severity of symptoms is moderate. The symptoms are aggravated by family issues and work stress. The quality of sleep is poor.   Her past medical history is significant for anxiety/panic attacks and depression. Past treatments include nothing. The treatment provided no relief. Compliance with prior treatments has been good.  Depression      The patient presents with depression.  This is a new problem.  The current episode started more than 1 year ago.   The onset quality is gradual.   The problem occurs constantly.  The problem has been waxing and waning since onset.  Associated symptoms include helplessness, hopelessness, insomnia, restlessness, appetite change (PT has gained 30 lbs since January) and sad.  Associated symptoms include no headaches, no indigestion and no suicidal ideas.  Past treatments include nothing.  Past medical history includes anxiety and depression.       Review of Systems  Constitutional: Positive for appetite change (PT has gained 30 lbs since January) and irritability.  HENT: Negative.   Eyes: Negative.   Respiratory: Negative.  Negative for shortness of breath.   Cardiovascular: Negative.  Negative for palpitations.  Gastrointestinal: Negative.   Endocrine: Negative.   Genitourinary: Negative.   Musculoskeletal:  Negative.   Neurological: Negative.  Negative for headaches.  Hematological: Negative.   Psychiatric/Behavioral: Positive for depression. Negative for suicidal ideas. The patient is nervous/anxious and has insomnia.   All other systems reviewed and are negative.      Objective:   Physical Exam  Constitutional: She is oriented to person, place, and time. She appears well-developed and well-nourished. No distress.  HENT:  Head: Normocephalic and atraumatic.  Right Ear: External ear normal.  Left Ear: External ear normal.  Nose: Nose normal.  Mouth/Throat: Oropharynx is clear and moist.  Eyes: Pupils are equal, round, and reactive to light.  Neck: Normal range of motion. Neck supple. No thyromegaly present.  Cardiovascular: Normal rate, regular rhythm, normal heart sounds and intact distal pulses.   No murmur heard. Pulmonary/Chest: Effort normal and breath sounds normal. No respiratory distress. She has no wheezes.  Abdominal: Soft. Bowel sounds are normal. She exhibits no distension. There is no tenderness.  Musculoskeletal: Normal range of motion. She exhibits no edema or tenderness.  Neurological: She is alert and oriented to person, place, and time. She has normal reflexes. No cranial nerve deficit.  Skin: Skin is warm and dry.  Psychiatric: She has a normal mood and affect. Her behavior is normal. Judgment and thought content normal.  Vitals reviewed.     BP 138/91 mmHg  Pulse 84  Temp(Src) 98.2 F (36.8 C) (Oral)  Ht _0  (1.6 m)  Wt 281 lb 6.4 oz (127.642 kg)  BMI 49.86 kg/m2     Assessment & Plan:  1. Hypothyroidism due to acquired atrophy of thyroid - CMP14+EGFR - Thyroid Panel With  TSH  2. GAD (generalized anxiety disorder) -Stress management discussed Pt started on Celexa 20 mg today Pt educated to only take xanax as needed RTO in 2 months - citalopram (CELEXA) 20 MG tablet; Take 1 tablet (20 mg total) by mouth daily.  Dispense: 90 tablet; Refill: 1 -  ALPRAZolam (XANAX) 0.5 MG tablet; Take 1 tablet (0.5 mg total) by mouth 2 (two) times daily as needed for anxiety.  Dispense: 45 tablet; Refill: 1 - CMP14+EGFR  3. Depression --Stress management discussed Pt started on Celexa 20 mg today Pt educated to only take xanax as needed RTO in 2 months - citalopram (CELEXA) 20 MG tablet; Take 1 tablet (20 mg total) by mouth daily.  Dispense: 90 tablet; Refill: 1 - ALPRAZolam (XANAX) 0.5 MG tablet; Take 1 tablet (0.5 mg total) by mouth 2 (two) times daily as needed for anxiety.  Dispense: 45 tablet; Refill: 1 - CMP14+EGFR  4. Morbid obesity with body mass index of 45.0-49.9 in adult -Discussed importance of healthy diet and exercise    Continue all meds Labs pending Health Maintenance reviewed Diet and exercise encouraged RTO 2 months to recheck depression and GAD  Evelina Dun, FNP

## 2014-10-08 NOTE — Patient Instructions (Signed)
Depression Depression refers to feeling sad, low, down in the dumps, blue, gloomy, or empty. In general, there are two kinds of depression: 1. Normal sadness or normal grief. This kind of depression is one that we all feel from time to time after upsetting life experiences, such as the loss of a job or the ending of a relationship. This kind of depression is considered normal, is short lived, and resolves within a few days to 2 weeks. Depression experienced after the loss of a loved one (bereavement) often lasts longer than 2 weeks but normally gets better with time. 2. Clinical depression. This kind of depression lasts longer than normal sadness or normal grief or interferes with your ability to function at home, at work, and in school. It also interferes with your personal relationships. It affects almost every aspect of your life. Clinical depression is an illness. Symptoms of depression can also be caused by conditions other than those mentioned above, such as:  Physical illness. Some physical illnesses, including underactive thyroid gland (hypothyroidism), severe anemia, specific types of cancer, diabetes, uncontrolled seizures, heart and lung problems, strokes, and chronic pain are commonly associated with symptoms of depression.  Side effects of some prescription medicine. In some people, certain types of medicine can cause symptoms of depression.  Substance abuse. Abuse of alcohol and illicit drugs can cause symptoms of depression. SYMPTOMS Symptoms of normal sadness and normal grief include the following:  Feeling sad or crying for short periods of time.  Not caring about anything (apathy).  Difficulty sleeping or sleeping too much.  No longer able to enjoy the things you used to enjoy.  Desire to be by oneself all the time (social isolation).  Lack of energy or motivation.  Difficulty concentrating or remembering.  Change in appetite or weight.  Restlessness or  agitation. Symptoms of clinical depression include the same symptoms of normal sadness or normal grief and also the following symptoms:  Feeling sad or crying all the time.  Feelings of guilt or worthlessness.  Feelings of hopelessness or helplessness.  Thoughts of suicide or the desire to harm yourself (suicidal ideation).  Loss of touch with reality (psychotic symptoms). Seeing or hearing things that are not real (hallucinations) or having false beliefs about your life or the people around you (delusions and paranoia). DIAGNOSIS  The diagnosis of clinical depression is usually based on how bad the symptoms are and how long they have lasted. Your health care provider will also ask you questions about your medical history and substance use to find out if physical illness, use of prescription medicine, or substance abuse is causing your depression. Your health care provider may also order blood tests. TREATMENT  Often, normal sadness and normal grief do not require treatment. However, sometimes antidepressant medicine is given for bereavement to ease the depressive symptoms until they resolve. The treatment for clinical depression depends on how bad the symptoms are but often includes antidepressant medicine, counseling with a mental health professional, or both. Your health care provider will help to determine what treatment is best for you. Depression caused by physical illness usually goes away with appropriate medical treatment of the illness. If prescription medicine is causing depression, talk with your health care provider about stopping the medicine, decreasing the dose, or changing to another medicine. Depression caused by the abuse of alcohol or illicit drugs goes away when you stop using these substances. Some adults need professional help in order to stop drinking or using drugs. SEEK IMMEDIATE MEDICAL   CARE IF:  You have thoughts about hurting yourself or others.  You lose touch  with reality (have psychotic symptoms).  You are taking medicine for depression and have a serious side effect. FOR MORE INFORMATION  National Alliance on Mental Illness: www.nami.org  National Institute of Mental Health: www.nimh.nih.gov Document Released: 01/07/2000 Document Revised: 05/26/2013 Document Reviewed: 04/10/2011 Exit[employer] Patient Information 2015 [employer]. This information is not intended to replace advice given to you by your health care provider. Make sure you discuss any questions you have with your health care provider. Generalized Anxiety Disorder Generalized anxiety disorder (GAD) is a mental disorder. It interferes with life functions, including relationships, work, and school. GAD is different from normal anxiety, which everyone experiences at some point in their lives in response to specific life events and activities. Normal anxiety actually helps us prepare for and get through these life events and activities. Normal anxiety goes away after the event or activity is over.  GAD causes anxiety that is not necessarily related to specific events or activities. It also causes excess anxiety in proportion to specific events or activities. The anxiety associated with GAD is also difficult to control. GAD can vary from mild to severe. People with severe GAD can have intense waves of anxiety with physical symptoms (panic attacks).  SYMPTOMS The anxiety and worry associated with GAD are difficult to control. This anxiety and worry are related to many life events and activities and also occur more days than not for 6 months or longer. People with GAD also have three or more of the following symptoms (one or more in children): 3. Restlessness.  4. Fatigue. 5. Difficulty concentrating.  6. Irritability. 7. Muscle tension. 8. Difficulty sleeping or unsatisfying sleep. DIAGNOSIS GAD is diagnosed through an assessment by your health care provider. Your health care provider  will ask you questions aboutyour mood,physical symptoms, and events in your life. Your health care provider may ask you about your medical history and use of alcohol or drugs, including prescription medicines. Your health care provider may also do a physical exam and blood tests. Certain medical conditions and the use of certain substances can cause symptoms similar to those associated with GAD. Your health care provider may refer you to a mental health specialist for further evaluation. TREATMENT The following therapies are usually used to treat GAD:   Medication. Antidepressant medication usually is prescribed for long-term daily control. Antianxiety medicines may be added in severe cases, especially when panic attacks occur.   Talk therapy (psychotherapy). Certain types of talk therapy can be helpful in treating GAD by providing support, education, and guidance. A form of talk therapy called cognitive behavioral therapy can teach you healthy ways to think about and react to daily life events and activities.  Stress managementtechniques. These include yoga, meditation, and exercise and can be very helpful when they are practiced regularly. A mental health specialist can help determine which treatment is best for you. Some people see improvement with one therapy. However, other people require a combination of therapies. Document Released: 05/06/2012 Document Revised: 05/26/2013 Document Reviewed: 05/06/2012 [employer] Patient Information 2015 [employer]. This information is not intended to replace advice given to you by your health care provider. Make sure you discuss any questions you have with your health care provider.  

## 2014-10-09 ENCOUNTER — Other Ambulatory Visit: Payer: Self-pay | Admitting: Family

## 2014-10-09 LAB — THYROID PANEL WITH TSH
FREE THYROXINE INDEX: 1.6 (ref 1.2–4.9)
T3 UPTAKE RATIO: 19 % — AB (ref 24–39)
T4, Total: 8.2 ug/dL (ref 4.5–12.0)
TSH: 7.99 u[IU]/mL — AB (ref 0.450–4.500)

## 2014-10-09 LAB — CMP14+EGFR
ALBUMIN: 4.1 g/dL (ref 3.5–5.5)
ALT: 65 IU/L — AB (ref 0–32)
AST: 52 IU/L — ABNORMAL HIGH (ref 0–40)
Albumin/Globulin Ratio: 1.5 (ref 1.1–2.5)
Alkaline Phosphatase: 72 IU/L (ref 39–117)
BUN / CREAT RATIO: 13 (ref 8–20)
BUN: 8 mg/dL (ref 6–20)
Bilirubin Total: <0.2 mg/dL (ref 0.0–1.2)
CO2: 25 mmol/L (ref 18–29)
CREATININE: 0.6 mg/dL (ref 0.57–1.00)
Calcium: 9.2 mg/dL (ref 8.7–10.2)
Chloride: 98 mmol/L (ref 97–108)
GFR calc Af Amer: 147 mL/min/{1.73_m2} (ref >59)
GFR calc non Af Amer: 127 mL/min/{1.73_m2} (ref >59)
GLUCOSE: 120 mg/dL — AB (ref 65–99)
Globulin, Total: 2.8 g/dL (ref 1.5–4.5)
Potassium: 4 mmol/L (ref 3.5–5.2)
Sodium: 139 mmol/L (ref 134–144)
TOTAL PROTEIN: 6.9 g/dL (ref 6.0–8.5)

## 2014-10-09 MED ORDER — LEVOTHYROXINE SODIUM 150 MCG PO TABS: 150.0000 ug | ORAL_TABLET | Freq: Every day | ORAL | Status: DC

## 2014-10-23 ENCOUNTER — Encounter: Payer: Self-pay | Admitting: Physician Assistant

## 2014-10-23 ENCOUNTER — Ambulatory Visit (INDEPENDENT_AMBULATORY_CARE_PROVIDER_SITE_OTHER): Payer: BLUE CROSS/BLUE SHIELD

## 2014-10-23 ENCOUNTER — Ambulatory Visit (INDEPENDENT_AMBULATORY_CARE_PROVIDER_SITE_OTHER): Payer: BLUE CROSS/BLUE SHIELD | Admitting: Physician Assistant

## 2014-10-23 VITALS — BP 158/105 | HR 80 | Temp 97.3°F | Ht 63.0 in | Wt 281.0 lb

## 2014-10-23 DIAGNOSIS — M25571 Pain in right ankle and joints of right foot: Secondary | ICD-10-CM | POA: Diagnosis not present

## 2014-10-23 NOTE — Progress Notes (Signed)
   Subjective:    Patient ID: Diana Avery, female    DOB: 07/19/1990, 24 y.o.   MRN: [id number]  HPI 24 y/o female presents with c/o right leg and ankle pain s/p falling down the stairs 3 weeks ago. She is still having pain and swellling. She has taken a few ibuprofen.     Review of Systems  Cardiovascular: Positive for leg swelling.  Musculoskeletal:       Right foot pain, increased pain on lateral ankle with walking  Skin: Positive for color change.       Objective:   Physical Exam  Constitutional: She is oriented to person, place, and time. She appears well-developed and well-nourished.  Morbidly obese   Pulmonary/Chest: Effort normal and breath sounds normal.  Neurological: She is alert and oriented to person, place, and time.  Psychiatric: She has a normal mood and affect. Her behavior is normal. Judgment and thought content normal.  Nursing note and vitals reviewed.         Assessment & Plan:  1. Pain in joint, ankle and foot, right - Most likely ankle sprain. Xray negative for fracture.  - DG Foot Complete Right; Future  - Elevate - compression with ankle brace - Ibuprofen 800mg  q 8 hours or aleve twice daily x 1 week.   F/U if no improvement     Tiffany A. Benjamin Stain PA-C

## 2014-10-23 NOTE — Patient Instructions (Signed)

## 2014-11-30 ENCOUNTER — Ambulatory Visit (INDEPENDENT_AMBULATORY_CARE_PROVIDER_SITE_OTHER): Payer: BLUE CROSS/BLUE SHIELD | Admitting: Family

## 2014-11-30 ENCOUNTER — Encounter: Payer: Self-pay | Admitting: Family

## 2014-11-30 VITALS — BP 117/81 | HR 85 | Temp 97.5°F | Ht 63.0 in | Wt 286.4 lb

## 2014-11-30 DIAGNOSIS — Z6841 Body Mass Index (BMI) 40.0 and over, adult: Secondary | ICD-10-CM

## 2014-11-30 DIAGNOSIS — F411 Generalized anxiety disorder: Secondary | ICD-10-CM

## 2014-11-30 DIAGNOSIS — F32A Depression, unspecified: Secondary | ICD-10-CM

## 2014-11-30 DIAGNOSIS — E785 Hyperlipidemia, unspecified: Secondary | ICD-10-CM

## 2014-11-30 DIAGNOSIS — E039 Hypothyroidism, unspecified: Secondary | ICD-10-CM | POA: Diagnosis not present

## 2014-11-30 DIAGNOSIS — F329 Major depressive disorder, single episode, unspecified: Secondary | ICD-10-CM | POA: Diagnosis not present

## 2014-11-30 DIAGNOSIS — E8881 Metabolic syndrome: Secondary | ICD-10-CM

## 2014-11-30 LAB — POCT GLYCOSYLATED HEMOGLOBIN (HGB A1C): Hemoglobin A1C: 5.5

## 2014-11-30 MED ORDER — ALPRAZOLAM 0.5 MG PO TABS: 0.5000 mg | ORAL_TABLET | Freq: Two times a day (BID) | ORAL | Status: DC | PRN

## 2014-11-30 MED ORDER — PHENTERMINE HCL 37.5 MG PO CAPS: 37.5000 mg | ORAL_CAPSULE | ORAL | Status: DC

## 2014-11-30 MED ORDER — CITALOPRAM HYDROBROMIDE 40 MG PO TABS: 40.0000 mg | ORAL_TABLET | Freq: Every day | ORAL | Status: DC

## 2014-11-30 NOTE — Patient Instructions (Signed)
Exercising to Lose Weight Exercising can help you to lose weight. In order to lose weight through exercise, you need to do vigorous-intensity exercise. You can tell that you are exercising with vigorous intensity if you are breathing very hard and fast and cannot hold a conversation while exercising. Moderate-intensity exercise helps to maintain your current weight. You can tell that you are exercising at a moderate level if you have a higher heart rate and faster breathing, but you are still able to hold a conversation. HOW OFTEN SHOULD I EXERCISE? Choose an activity that you enjoy and set realistic goals. Your health care provider can help you to make an activity plan that works for you. Exercise regularly as directed by your health care provider. This may include:  Doing resistance training twice each week, such as:  Push-ups.  Sit-ups.  Lifting weights.  Using resistance bands.  Doing a given intensity of exercise for a given amount of time. Choose from these options:  150 minutes of moderate-intensity exercise every week.  75 minutes of vigorous-intensity exercise every week.  A mix of moderate-intensity and vigorous-intensity exercise every week. Children, pregnant women, people who are out of shape, people who are overweight, and older adults may need to consult a health care provider for individual recommendations. If you have any sort of medical condition, be sure to consult your health care provider before starting a new exercise program. WHAT ARE SOME ACTIVITIES THAT CAN HELP ME TO LOSE WEIGHT?   Walking at a rate of at least 4.5 miles an hour.  Jogging or running at a rate of 5 miles per hour.  Biking at a rate of at least 10 miles per hour.  Lap swimming.  Roller-skating or in-line skating.  Cross-country skiing.  Vigorous competitive sports, such as football, basketball, and soccer.  Jumping rope.  Aerobic dancing. HOW CAN I BE MORE ACTIVE IN MY DAY-TO-DAY  ACTIVITIES?  Use the stairs instead of the elevator.  Take a walk during your lunch break.  If you drive, park your car farther away from work or school.  If you take public transportation, get off one stop early and walk the rest of the way.  Make all of your phone calls while standing up and walking around.  Get up, stretch, and walk around every 30 minutes throughout the day. WHAT GUIDELINES SHOULD I FOLLOW WHILE EXERCISING?  Do not exercise so much that you hurt yourself, feel dizzy, or get very short of breath.  Consult your health care provider prior to starting a new exercise program.  Wear comfortable clothes and shoes with good support.  Drink plenty of water while you exercise to prevent dehydration or heat stroke. Body water is lost during exercise and must be replaced.  Work out until you breathe faster and your heart beats faster.   This information is not intended to replace advice given to you by your health care provider. Make sure you discuss any questions you have with your health care provider.   Document Released: 02/11/2010 Document Revised: 01/30/2014 Document Reviewed: 06/12/2013 Elsevier Interactive Patient Education 2016 [employer] Calorie Counting for Weight Loss Calories are energy you get from the things you eat and drink. Your body uses this energy to keep you going throughout the day. The number of calories you eat affects your weight. When you eat more calories than your body needs, your body stores the extra calories as fat. When you eat fewer calories than your body needs, your body burns   fat to get the energy it needs. Calorie counting means keeping track of how many calories you eat and drink each day. If you make sure to eat fewer calories than your body needs, you should lose weight. In order for calorie counting to work, you will need to eat the number of calories that are right for you in a day to lose a healthy amount of weight per week. A  healthy amount of weight to lose per week is usually 1-2 lb (0.5-0.9 kg). A dietitian can determine how many calories you need in a day and give you suggestions on how to reach your calorie goal.  WHAT IS MY MY PLAN? My goal is to have __________ calories per day.  If I have this many calories per day, I should lose around __________ pounds per week. WHAT DO I NEED TO KNOW ABOUT CALORIE COUNTING? In order to meet your daily calorie goal, you will need to:  Find out how many calories are in each food you would like to eat. Try to do this before you eat.  Decide how much of the food you can eat.  Write down what you ate and how many calories it had. Doing this is called keeping a food log. WHERE DO I FIND CALORIE INFORMATION? The number of calories in a food can be found on a Nutrition Facts label. Note that all the information on a label is based on a specific serving of the food. If a food does not have a Nutrition Facts label, try to look up the calories online or ask your dietitian for help. HOW DO I DECIDE HOW MUCH TO EAT? To decide how much of the food you can eat, you will need to consider both the number of calories in one serving and the size of one serving. This information can be found on the Nutrition Facts label. If a food does not have a Nutrition Facts label, look up the information online or ask your dietitian for help. Remember that calories are listed per serving. If you choose to have more than one serving of a food, you will have to multiply the calories per serving by the amount of servings you plan to eat. For example, the label on a package of bread might say that a serving size is 1 slice and that there are 90 calories in a serving. If you eat 1 slice, you will have eaten 90 calories. If you eat 2 slices, you will have eaten 180 calories. HOW DO I KEEP A FOOD LOG? After each meal, record the following information in your food log:  What you ate.  How much of it you  ate.  How many calories it had.  Then, add up your calories. Keep your food log near you, such as in a small notebook in your pocket. Another option is to use a mobile app or website. Some programs will calculate calories for you and show you how many calories you have left each time you add an item to the log. WHAT ARE SOME CALORIE COUNTING TIPS?  Use your calories on foods and drinks that will fill you up and not leave you hungry. Some examples of this include foods like nuts and nut butters, vegetables, lean proteins, and high-fiber foods (more than 5 g fiber per serving).  Eat nutritious foods and avoid empty calories. Empty calories are calories you get from foods or beverages that do not have many nutrients, such as candy and soda. It   is better to have a nutritious high-calorie food (such as an avocado) than a food with few nutrients (such as a bag of chips).  Know how many calories are in the foods you eat most often. This way, you do not have to look up how many calories they have each time you eat them.  Look out for foods that may seem like low-calorie foods but are really high-calorie foods, such as baked goods, soda, and fat-free candy.  Pay attention to calories in drinks. Drinks such as sodas, specialty coffee drinks, alcohol, and juices have a lot of calories yet do not fill you up. Choose low-calorie drinks like water and diet drinks.  Focus your calorie counting efforts on higher calorie items. Logging the calories in a garden salad that contains only vegetables is less important than calculating the calories in a milk shake.  Find a way of tracking calories that works for you. Get creative. Most people who are successful find ways to keep track of how much they eat in a day, even if they do not count every calorie. WHAT ARE SOME PORTION CONTROL TIPS?  Know how many calories are in a serving. This will help you know how many servings of a certain food you can have.  Use a  measuring cup to measure serving sizes. This is helpful when you start out. With time, you will be able to estimate serving sizes for some foods.  Take some time to put servings of different foods on your favorite plates, bowls, and cups so you know what a serving looks like.  Try not to eat straight from a bag or box. Doing this can lead to overeating. Put the amount you would like to eat in a cup or on a plate to make sure you are eating the right portion.  Use smaller plates, glasses, and bowls to prevent overeating. This is a quick and easy way to practice portion control. If your plate is smaller, less food can fit on it.  Try not to multitask while eating, such as watching TV or using your computer. If it is time to eat, sit down at a table and enjoy your food. Doing this will help you to start recognizing when you are full. It will also make you more aware of what and how much you are eating. HOW CAN I CALORIE COUNT WHEN EATING OUT?  Ask for smaller portion sizes or child-sized portions.  Consider sharing an entree and sides instead of getting your own entree.  If you get your own entree, eat only half. Ask for a box at the beginning of your meal and put the rest of your entree in it so you are not tempted to eat it.  Look for the calories on the menu. If calories are listed, choose the lower calorie options.  Choose dishes that include vegetables, fruits, whole grains, low-fat dairy products, and lean protein. Focusing on smart food choices from each of the 5 food groups can help you stay on track at restaurants.  Choose items that are boiled, broiled, grilled, or steamed.  Choose water, milk, unsweetened iced tea, or other drinks without added sugars. If you want an alcoholic beverage, choose a lower calorie option. For example, a regular margarita can have up to 700 calories and a glass of wine has around 150.  Stay away from items that are buttered, battered, fried, or served with  cream sauce. Items labeled "crispy" are usually fried, unless stated otherwise.    Ask for dressings, sauces, and syrups on the side. These are usually very high in calories, so do not eat much of them.  Watch out for salads. Many people think salads are a healthy option, but this is often not the case. Many salads come with bacon, fried chicken, lots of cheese, fried chips, and dressing. All of these items have a lot of calories. If you want a salad, choose a garden salad and ask for grilled meats or steak. Ask for the dressing on the side, or ask for olive oil and vinegar or lemon to use as dressing.  Estimate how many servings of a food you are given. For example, a serving of cooked rice is  cup or about the size of half a tennis ball or one cupcake wrapper. Knowing serving sizes will help you be aware of how much food you are eating at restaurants. The list below tells you how big or small some common portion sizes are based on everyday objects.  1 oz--4 stacked dice.  3 oz--1 deck of cards.  1 tsp--1 dice.  1 Tbsp-- a Ping-Pong ball.  2 Tbsp--1 Ping-Pong ball.   cup--1 tennis ball or 1 cupcake wrapper.  1 cup--1 baseball.   This information is not intended to replace advice given to you by your health care provider. Make sure you discuss any questions you have with your health care provider.   Document Released: 01/09/2005 Document Revised: 01/30/2014 Document Reviewed: 11/14/2012 Elsevier Interactive Patient Education 2016 [employer]  

## 2014-11-30 NOTE — Progress Notes (Signed)
Subjective:    Patient ID: Diana Avery, female    DOB: [birth year], 24 y.o.   MRN: [id number]  Pt presents to the office today to recheck GAD and Depression. Pt states she is feeling better from her last visit, but not where she wants to be. Pt currently taking Celexa 20 mg daily and xanax 0.70m BID as needed. PT would also like to discuss weight loss medication today. Pt states she has been on phentermine in the past and saw a nutritionist with good results in the past. Pt states she would like to try this again.  Anxiety Presents for initial visit. Onset was 1 to 6 months ago. The problem has been waxing and waning. Symptoms include depressed mood, excessive worry, insomnia, irritability, nervous/anxious behavior, panic and restlessness. Patient reports no palpitations, shortness of breath or suicidal ideas. Symptoms occur most days. The severity of symptoms is moderate. The symptoms are aggravated by family issues and work stress. The quality of sleep is poor.   Her past medical history is significant for anxiety/panic attacks and depression. Past treatments include nothing. The treatment provided no relief. Compliance with prior treatments has been good.  Depression      The patient presents with depression.  This is a new problem.  The current episode started more than 1 year ago.   The onset quality is gradual.   The problem occurs constantly.  The problem has been waxing and waning since onset.  Associated symptoms include helplessness, hopelessness, insomnia, restlessness, appetite change (PT has gained 30 lbs since January) and sad.  Associated symptoms include no fatigue, no headaches, no indigestion and no suicidal ideas.  Past treatments include nothing.  Past medical history includes thyroid problem, anxiety and depression.   Thyroid Problem Presents for follow-up visit. Symptoms include anxiety, depressed mood and diarrhea. Patient reports no fatigue, leg swelling or palpitations.  The symptoms have been improving. Past treatments include levothyroxine. The treatment provided moderate relief. Her past medical history is significant for hyperlipidemia and obesity. There is no history of diabetes.  Hyperlipidemia This is a chronic problem. The current episode started more than 1 year ago. The problem is uncontrolled. Recent lipid tests were reviewed and are high. She has no history of diabetes. Pertinent negatives include no shortness of breath. Current antihyperlipidemic treatment includes diet change. The current treatment provides no improvement of lipids. Risk factors for coronary artery disease include diabetes mellitus, dyslipidemia, obesity and stress.      Review of Systems  Constitutional: Positive for appetite change (PT has gained 30 lbs since January) and irritability. Negative for fatigue.  HENT: Negative.   Eyes: Negative.   Respiratory: Negative.  Negative for shortness of breath.   Cardiovascular: Negative.  Negative for palpitations.  Gastrointestinal: Positive for diarrhea.  Endocrine: Negative.   Genitourinary: Negative.   Musculoskeletal: Negative.   Neurological: Negative.  Negative for headaches.  Hematological: Negative.   Psychiatric/Behavioral: Positive for depression. Negative for suicidal ideas. The patient is nervous/anxious and has insomnia.   All other systems reviewed and are negative.      Objective:   Physical Exam  Constitutional: She is oriented to person, place, and time. She appears well-developed and well-nourished. No distress.  HENT:  Head: Normocephalic and atraumatic.  Right Ear: External ear normal.  Left Ear: External ear normal.  Nose: Nose normal.  Mouth/Throat: Oropharynx is clear and moist.  Eyes: Pupils are equal, round, and reactive to light.  Neck: Normal range of motion. Neck  supple. No thyromegaly present.  Cardiovascular: Normal rate, regular rhythm, normal heart sounds and intact distal pulses.   No murmur  heard. Pulmonary/Chest: Effort normal and breath sounds normal. No respiratory distress. She has no wheezes.  Abdominal: Soft. Bowel sounds are normal. She exhibits no distension. There is no tenderness.  Musculoskeletal: Normal range of motion. She exhibits no edema or tenderness.  Neurological: She is alert and oriented to person, place, and time. She has normal reflexes. No cranial nerve deficit.  Skin: Skin is warm and dry.  Psychiatric: She has a normal mood and affect. Her behavior is normal. Judgment and thought content normal.  Vitals reviewed.   BP 117/81 mmHg  Pulse 85  Temp(Src) 97.5 F (36.4 C) (Oral)  Ht 5' 3"  (1.6 m)  Wt 286 lb 6.4 oz (129.91 kg)  BMI 50.75 kg/m2  LMP 11/29/2014       Assessment & Plan:  1. Hypothyroidism, unspecified hypothyroidism type - CMP14+EGFR - Thyroid Panel With TSH  2. Depression -Celexa increased to 40 mg today from 20 mg - CMP14+EGFR - citalopram (CELEXA) 40 MG tablet; Take 1 tablet (40 mg total) by mouth daily.  Dispense: 30 tablet; Refill: 3  3. GAD (generalized anxiety disorder) --Celexa increased to 40 mg today from 20 mg -Stress management  discussed - CMP14+EGFR - citalopram (CELEXA) 40 MG tablet; Take 1 tablet (40 mg total) by mouth daily.  Dispense: 30 tablet; Refill: 3  4. Morbid obesity with body mass index of 45.0-49.9 in adult [facility]) -Pt to make appt with Tammy to discuss diet and nutrition -Pt started on Phentermine today- Discussed importance of diet and exercising - CMP14+EGFR - phentermine 37.5 MG capsule; Take 1 capsule (37.5 mg total) by mouth every morning.  Dispense: 90 capsule; Refill: 0  5. Hyperlipidemia - CMP14+EGFR - Lipid panel  6. Metabolic syndrome  - POCT glycosylated hemoglobin (Hb A1C) - CMP14+EGFR   Continue all meds Labs pending Health Maintenance reviewed Diet and exercise encouraged RTO 3 months  Evelina Dun, FNP

## 2014-12-01 ENCOUNTER — Other Ambulatory Visit: Payer: Self-pay | Admitting: Family

## 2014-12-01 LAB — CMP14+EGFR
A/G RATIO: 1.5 (ref 1.1–2.5)
ALBUMIN: 4.2 g/dL (ref 3.5–5.5)
ALT: 81 IU/L — ABNORMAL HIGH (ref 0–32)
AST: 45 IU/L — AB (ref 0–40)
Alkaline Phosphatase: 69 IU/L (ref 39–117)
BUN / CREAT RATIO: 16 (ref 8–20)
BUN: 10 mg/dL (ref 6–20)
Bilirubin Total: 0.4 mg/dL (ref 0.0–1.2)
CALCIUM: 9 mg/dL (ref 8.7–10.2)
CO2: 22 mmol/L (ref 18–29)
CREATININE: 0.62 mg/dL (ref 0.57–1.00)
Chloride: 98 mmol/L (ref 97–106)
GFR, EST AFRICAN AMERICAN: 145 mL/min/{1.73_m2} (ref >59)
GFR, EST NON AFRICAN AMERICAN: 126 mL/min/{1.73_m2} (ref >59)
GLOBULIN, TOTAL: 2.8 g/dL (ref 1.5–4.5)
Glucose: 91 mg/dL (ref 65–99)
POTASSIUM: 4.5 mmol/L (ref 3.5–5.2)
SODIUM: 136 mmol/L (ref 136–144)
Total Protein: 7 g/dL (ref 6.0–8.5)

## 2014-12-01 LAB — LIPID PANEL
CHOL/HDL RATIO: 4.6 ratio — AB (ref 0.0–4.4)
Cholesterol, Total: 171 mg/dL (ref 100–199)
HDL: 37 mg/dL — ABNORMAL LOW (ref >39)
LDL CALC: 106 mg/dL — AB (ref 0–99)
TRIGLYCERIDES: 138 mg/dL (ref 0–149)
VLDL Cholesterol Cal: 28 mg/dL (ref 5–40)

## 2014-12-01 LAB — THYROID PANEL WITH TSH
FREE THYROXINE INDEX: 2.7 (ref 1.2–4.9)
T3 UPTAKE RATIO: 25 % (ref 24–39)
T4 TOTAL: 10.6 ug/dL (ref 4.5–12.0)
TSH: 2.93 u[IU]/mL (ref 0.450–4.500)

## 2014-12-01 MED ORDER — SIMVASTATIN 20 MG PO TABS: 20.0000 mg | ORAL_TABLET | Freq: Every day | ORAL | Status: DC

## 2014-12-29 ENCOUNTER — Other Ambulatory Visit: Payer: Self-pay

## 2014-12-29 DIAGNOSIS — F411 Generalized anxiety disorder: Secondary | ICD-10-CM

## 2014-12-29 DIAGNOSIS — F32A Depression, unspecified: Secondary | ICD-10-CM

## 2014-12-29 DIAGNOSIS — F329 Major depressive disorder, single episode, unspecified: Secondary | ICD-10-CM

## 2014-12-29 MED ORDER — CITALOPRAM HYDROBROMIDE 40 MG PO TABS: 40.0000 mg | ORAL_TABLET | Freq: Every day | ORAL | Status: DC

## 2014-12-31 ENCOUNTER — Other Ambulatory Visit: Payer: Self-pay

## 2014-12-31 DIAGNOSIS — F329 Major depressive disorder, single episode, unspecified: Secondary | ICD-10-CM

## 2014-12-31 DIAGNOSIS — F411 Generalized anxiety disorder: Secondary | ICD-10-CM

## 2014-12-31 DIAGNOSIS — F32A Depression, unspecified: Secondary | ICD-10-CM

## 2014-12-31 MED ORDER — CITALOPRAM HYDROBROMIDE 40 MG PO TABS: 40.0000 mg | ORAL_TABLET | Freq: Every day | ORAL | Status: DC

## 2015-02-08 ENCOUNTER — Telehealth: Payer: Self-pay | Admitting: Nurse Practitioner

## 2015-02-08 NOTE — Telephone Encounter (Signed)
Stp and she had a positive urine pregnancy test 1/13, advised pt to call OB/GYN as we don't provide pregnancy care in our office. Pt voiced understanding.

## 2015-02-09 ENCOUNTER — Ambulatory Visit (INDEPENDENT_AMBULATORY_CARE_PROVIDER_SITE_OTHER): Payer: BLUE CROSS/BLUE SHIELD | Admitting: Gynecology

## 2015-02-09 ENCOUNTER — Encounter: Payer: Self-pay | Admitting: Gynecology

## 2015-02-09 VITALS — BP 120/70

## 2015-02-09 DIAGNOSIS — N912 Amenorrhea, unspecified: Secondary | ICD-10-CM | POA: Diagnosis not present

## 2015-02-09 LAB — PREGNANCY, URINE: Preg Test, Ur: NEGATIVE

## 2015-02-09 NOTE — Progress Notes (Signed)
Diana Avery [birth year] [id number]        25 y.o.  [id number] Presents LMP 01/08/2015. Stopped her contraception in October in anticipation of pregnancy. Currently on a prenatal vitamin. Positive home pregnancy test 3 days ago. No bleeding cramping or other symptoms  Past medical history,surgical history, problem list, medications, allergies, family history and social history were all reviewed and documented in the EPIC chart.  Directed ROS with pertinent positives and negatives documented in the history of present illness/assessment and plan.  Exam: Caryn Bee assistant Filed Vitals:   02/09/15 1401  BP: 120/70   General appearance:  Normal Abdomen obese without gross masses or tenderness Pelvic external BUS vagina normal. Cervix normal. Uterus difficult to palpate but no gross masses or tenderness. Adnexa without gross masses or tenderness  Assessment/Plan:  25 y.o. [id number] with positive home pregnancy test several days late for her menses. UPT here is negative. Check quantitative hCG and ABO Rh. Patient will follow up for results. Discussed possible serial quants and ultrasound at appropriate level. Recommended weaning herself off of her Celexa and Xanax. Continue on her Synthroid.    Anastasio Auerbach MD, 2:19 PM 02/09/2015

## 2015-02-09 NOTE — Addendum Note (Signed)
Addended by: Nelva Nay on: 02/09/2015 04:27 PM   Modules accepted: Orders

## 2015-02-09 NOTE — Patient Instructions (Addendum)
Office will call you with the blood pregnancy results Continue on a prenatal vitamin daily. Wean yourself off of the other medications you're taking except for the thyroid medicine. Stay on this.

## 2015-02-10 LAB — HCG, QUANTITATIVE, PREGNANCY: hCG, Beta Chain, Quant, S: <2 m[IU]/mL

## 2015-02-10 LAB — ABO AND RH: RH TYPE: POSITIVE

## 2015-02-25 ENCOUNTER — Telehealth: Payer: Self-pay

## 2015-02-25 NOTE — Telephone Encounter (Signed)
Patient called and said that she still has not started her period and its been 2 weeks since you told her she had chemical pregnancy.

## 2015-02-25 NOTE — Telephone Encounter (Signed)
Give it through next week. If it does not start by next week then Provera 10 mg twice a day 5 days

## 2015-02-25 NOTE — Telephone Encounter (Signed)
Patient called. Informed. 

## 2015-02-25 NOTE — Telephone Encounter (Signed)
Tried to call her back. Mailbox is full and cannot accept message.

## 2015-03-05 ENCOUNTER — Telehealth: Payer: Self-pay | Admitting: *Deleted

## 2015-03-05 MED ORDER — MEDROXYPROGESTERONE ACETATE 10 MG PO TABS: 10.0000 mg | ORAL_TABLET | Freq: Every day | ORAL | Status: DC

## 2015-03-05 MED ORDER — MEDROXYPROGESTERONE ACETATE 10 MG PO TABS: ORAL_TABLET | ORAL | Status: DC

## 2015-03-05 NOTE — Telephone Encounter (Signed)
Pt called to follow up from telephone encounter 02/25/15 was told to wait this week and no cycle Rx for provera 10 mg twice daily x5 days sent. Pt aware.

## 2015-04-05 ENCOUNTER — Telehealth: Payer: Self-pay | Admitting: Family

## 2015-04-05 NOTE — Telephone Encounter (Signed)
Patient is requesting refills on Zyrtec, Flonase & Cambia 50 mg

## 2015-04-06 ENCOUNTER — Other Ambulatory Visit: Payer: Self-pay | Admitting: Family

## 2015-04-06 MED ORDER — CETIRIZINE HCL 10 MG PO TABS: ORAL_TABLET | ORAL | Status: DC

## 2015-04-06 MED ORDER — FLUTICASONE PROPIONATE 50 MCG/ACT NA SUSP: 2.0000 | Freq: Every day | NASAL | Status: DC

## 2015-04-06 MED ORDER — DICLOFENAC POTASSIUM(MIGRAINE) 50 MG PO PACK: 50.0000 mg | PACK | Freq: Every day | ORAL | Status: DC

## 2015-04-06 NOTE — Telephone Encounter (Signed)
Patient aware.

## 2015-04-06 NOTE — Telephone Encounter (Signed)
RX refilled  

## 2015-04-07 NOTE — Telephone Encounter (Signed)
Last seen 11/30/14  Diana Avery  If approved route to nurse to call into [facility]

## 2015-04-07 NOTE — Telephone Encounter (Signed)
Refill called to [facility] 

## 2015-04-08 ENCOUNTER — Telehealth: Payer: Self-pay

## 2015-04-09 NOTE — Telephone Encounter (Signed)
x

## 2015-04-11 ENCOUNTER — Other Ambulatory Visit: Payer: Self-pay | Admitting: Family

## 2015-04-13 ENCOUNTER — Telehealth: Payer: Self-pay

## 2015-04-13 NOTE — Telephone Encounter (Signed)
Insurance denied Cambia    Member needs to try and fail at least one triptan  Sumatriptan, naratripan or rizatriptan

## 2015-04-13 NOTE — Telephone Encounter (Signed)
Patient has not tried sumatriptan

## 2015-04-13 NOTE — Telephone Encounter (Signed)
Insurance denied Cambia. Has patient tried Sumatriptan in the past? If so I will send in rx

## 2015-04-15 MED ORDER — SUMATRIPTAN SUCCINATE 50 MG PO TABS: 50.0000 mg | ORAL_TABLET | ORAL | Status: DC | PRN

## 2015-04-15 NOTE — Telephone Encounter (Signed)
Details left on voice mail about new script of Imitrex.

## 2015-04-15 NOTE — Telephone Encounter (Signed)
Imitrex Prescription sent to pharmacy   

## 2015-04-15 NOTE — Addendum Note (Signed)
Addended by: Evelina Dun A on: 04/15/2015 08:41 AM   Modules accepted: Orders

## 2015-04-20 ENCOUNTER — Ambulatory Visit (INDEPENDENT_AMBULATORY_CARE_PROVIDER_SITE_OTHER): Payer: BLUE CROSS/BLUE SHIELD | Admitting: Family Medicine

## 2015-04-20 ENCOUNTER — Encounter: Payer: Self-pay | Admitting: Family Medicine

## 2015-04-20 VITALS — BP 145/94 | HR 106 | Temp 98.5°F | Ht 63.0 in | Wt 288.0 lb

## 2015-04-20 DIAGNOSIS — R059 Cough, unspecified: Secondary | ICD-10-CM

## 2015-04-20 DIAGNOSIS — H6502 Acute serous otitis media, left ear: Secondary | ICD-10-CM

## 2015-04-20 DIAGNOSIS — R0981 Nasal congestion: Secondary | ICD-10-CM

## 2015-04-20 DIAGNOSIS — R6883 Chills (without fever): Secondary | ICD-10-CM

## 2015-04-20 DIAGNOSIS — R05 Cough: Secondary | ICD-10-CM

## 2015-04-20 LAB — VERITOR FLU A/B WAIVED
INFLUENZA B: NEGATIVE
Influenza A: NEGATIVE

## 2015-04-20 MED ORDER — AMOXICILLIN 500 MG PO CAPS: 500.0000 mg | ORAL_CAPSULE | Freq: Two times a day (BID) | ORAL | Status: DC

## 2015-04-20 NOTE — Progress Notes (Signed)
   HPI  Patient presents today here with concern for the flu.  Patient exaplins foor the last 3 days she's had chills, nasal congestion, cough,and left ear pain with muffled hearing. Close contact with a child who was diagnosed with the flu one day later, she spent about 9 hours with a child and haddensities on several times. She's tolerating food and fluids normally, has decreased appetite,  No shortness of breath No chest pain Positive body aches in the rib cage, back  PMH: Smoking status noted ROS: Per HPI  Objective: BP 145/94 mmHg  Pulse 106  Temp(Src) 98.5 F (36.9 C) (Oral)  Ht 5\' 3"  (1.6 m)  Wt 288 lb (130.636 kg)  BMI 51.03 kg/m2 Gen: NAD, alert, cooperative with exam HEENT: NCAT, eft TM with loss of landmarks and straw-colored fluid behind the TM, no erythema, rightTM erythematous butlandmarks are visible CV: RRR, good S1/S2, no murmur Resp: CTABL, no wheezes, non-labored Ext: No edema, warm Neuro: Alert and oriented, No gross deficits  Assessment and plan:  #  otitis media  L sided serous otitis media, treating with amox (not always nex=cessary but given acuity I am traeating Suportive care Flu neg, likely superimposed virus RTC with any concerns or failure to improve.     Orders Placed This Encounter  Procedures  . Veritor Flu A/B Waived    Order Specific Question:  Source    Answer:  nose    Meds ordered this encounter  Medications  . amoxicillin (AMOXIL) 500 MG capsule    Sig: Take 1 capsule (500 mg total) by mouth 2 (two) times daily.    Dispense:  20 capsule    Refill:  [place], MD [facility] 04/20/2015, 5:24 PM

## 2015-04-20 NOTE — Patient Instructions (Signed)
Great to meet you!  Come back if you have any concerns  I am treating you for an ear infection   Otitis Media, Adult Otitis media is redness, soreness, and inflammation of the middle ear. Otitis media may be caused by allergies or, most commonly, by infection. Often it occurs as a complication of the common cold. SIGNS AND SYMPTOMS Symptoms of otitis media may include:  Earache.  Fever.  Ringing in your ear.  Headache.  Leakage of fluid from the ear. DIAGNOSIS To diagnose otitis media, your health care provider will examine your ear with an otoscope. This is an instrument that allows your health care provider to see into your ear in order to examine your eardrum. Your health care provider also will ask you questions about your symptoms. TREATMENT  Typically, otitis media resolves on its own within 3-5 days. Your health care provider may prescribe medicine to ease your symptoms of pain. If otitis media does not resolve within 5 days or is recurrent, your health care provider may prescribe antibiotic medicines if he or she suspects that a bacterial infection is the cause. HOME CARE INSTRUCTIONS   If you were prescribed an antibiotic medicine, finish it all even if you start to feel better.  Take medicines only as directed by your health care provider.  Keep all follow-up visits as directed by your health care provider. SEEK MEDICAL CARE IF:  You have otitis media only in one ear, or bleeding from your nose, or both.  You notice a lump on your neck.  You are not getting better in 3-5 days.  You feel worse instead of better. SEEK IMMEDIATE MEDICAL CARE IF:   You have pain that is not controlled with medicine.  You have swelling, redness, or pain around your ear or stiffness in your neck.  You notice that part of your face is paralyzed.  You notice that the bone behind your ear (mastoid) is tender when you touch it. MAKE SURE YOU:   Understand these  instructions.  Will watch your condition.  Will get help right away if you are not doing well or get worse.   This information is not intended to replace advice given to you by your health care provider. Make sure you discuss any questions you have with your health care provider.   Document Released: 10/15/2003 Document Revised: 01/30/2014 Document Reviewed: 08/06/2012 Elsevier Interactive Patient Education [employer].

## 2015-05-25 ENCOUNTER — Encounter: Payer: Self-pay | Admitting: Family Medicine

## 2015-05-25 ENCOUNTER — Ambulatory Visit (INDEPENDENT_AMBULATORY_CARE_PROVIDER_SITE_OTHER): Payer: BLUE CROSS/BLUE SHIELD | Admitting: Family Medicine

## 2015-05-25 VITALS — BP 139/87 | HR 88 | Temp 98.6°F | Ht 63.0 in | Wt 285.0 lb

## 2015-05-25 DIAGNOSIS — H66004 Acute suppurative otitis media without spontaneous rupture of ear drum, recurrent, right ear: Secondary | ICD-10-CM | POA: Diagnosis not present

## 2015-05-25 MED ORDER — AZITHROMYCIN 250 MG PO TABS: ORAL_TABLET | ORAL | Status: DC

## 2015-05-25 NOTE — Progress Notes (Signed)
BP 139/87 mmHg  Pulse 88  Temp(Src) 98.6 F (37 C) (Oral)  Ht 5\' 3"  (1.6 m)  Wt 285 lb (129.275 kg)  BMI 50.50 kg/m2  LMP 05/06/2015 (Exact Date)   Subjective:    Patient ID: Diana Avery, female    DOB: 07-Nov-1990, 25 y.o.   MRN: [id number]  HPI: Diana Avery is a 25 y.o. female presenting on 05/25/2015 for Ear Pain   HPI Ear pain and congestion and nasal congestion Patient comes in today because she is having ear pain and congestion and nasal congestion that is recurrent. She was here in February and was treated with amoxicillin for otitis media. She says it never fully resolved after that. She does admit that she gets chronic allergies and has Zyrtec and Flonase but is not using them regularly. She had a lot of ear infections when she was a young child as well. Her husband is a smoker and often times smokes around her. She denies any fevers or chills or shortness of breath or wheezing.  Relevant past medical, surgical, family and social history reviewed and updated as indicated. Interim medical history since our last visit reviewed. Allergies and medications reviewed and updated.  Review of Systems  Constitutional: Negative for fever and chills.  HENT: Positive for congestion, postnasal drip, rhinorrhea, sinus pressure, sneezing and sore throat. Negative for ear discharge and ear pain.   Eyes: Negative for pain, redness and visual disturbance.  Respiratory: Positive for cough. Negative for chest tightness and shortness of breath.   Cardiovascular: Negative for chest pain and leg swelling.  Genitourinary: Negative for dysuria and difficulty urinating.  Musculoskeletal: Negative for back pain and gait problem.  Skin: Negative for rash.  Neurological: Negative for light-headedness and headaches.  Psychiatric/Behavioral: Negative for behavioral problems and agitation.  All other systems reviewed and are negative.   Per HPI unless specifically indicated  above     Medication List       This list is accurate as of: 05/25/15  6:41 PM.  Always use your most recent med list.               ALPRAZolam 0.5 MG tablet  Commonly known as:  XANAX  TAKE 1 TABLET BY MOUTH TWICE A DAY AS NEEDED FOR ANXIETY     azithromycin 250 MG tablet  Commonly known as:  ZITHROMAX  Take 2 the first day and then one each day after.     cetirizine 10 MG tablet  Commonly known as:  ZYRTEC  TAKE 1 TABLET (10 MG TOTAL) BY MOUTH DAILY.     citalopram 40 MG tablet  Commonly known as:  CELEXA  Take 1 tablet (40 mg total) by mouth daily.     Diclofenac Potassium 50 MG Pack  Commonly known as:  CAMBIA  Take 50 mg by mouth daily.     fluticasone 50 MCG/ACT nasal spray  Commonly known as:  FLONASE  Place 2 sprays into both nostrils daily.     levothyroxine 150 MCG tablet  Commonly known as:  SYNTHROID  Take 1 tablet (150 mcg total) by mouth daily before breakfast.     simvastatin 20 MG tablet  Commonly known as:  ZOCOR  Take 1 tablet (20 mg total) by mouth at bedtime.     SUMAtriptan 50 MG tablet  Commonly known as:  IMITREX  Take 1 tablet (50 mg total) by mouth every 2 (two) hours as needed for migraine. May repeat in 2 hours  if headache persists or recurs           Objective:    BP 139/87 mmHg  Pulse 88  Temp(Src) 98.6 F (37 C) (Oral)  Ht 5\' 3"  (1.6 m)  Wt 285 lb (129.275 kg)  BMI 50.50 kg/m2  LMP 05/06/2015 (Exact Date)  Wt Readings from Last 3 Encounters:  05/25/15 285 lb (129.275 kg)  04/20/15 288 lb (130.636 kg)  11/30/14 286 lb 6.4 oz (129.91 kg)    Physical Exam  Constitutional: She is oriented to person, place, and time. She appears well-developed and well-nourished. No distress.  HENT:  Right Ear: Ear canal normal. There is tenderness. Tympanic membrane is injected, scarred and erythematous. Tympanic membrane is not perforated and not bulging.  Left Ear: External ear and ear canal normal. Tympanic membrane is scarred.  Tympanic membrane is not injected, not perforated, not erythematous and not bulging.  Nose: Mucosal edema and rhinorrhea present. No epistaxis. Right sinus exhibits no maxillary sinus tenderness and no frontal sinus tenderness. Left sinus exhibits no maxillary sinus tenderness and no frontal sinus tenderness.  Mouth/Throat: Uvula is midline and mucous membranes are normal. Posterior oropharyngeal edema and posterior oropharyngeal erythema present. No oropharyngeal exudate or tonsillar abscesses.  Eyes: Conjunctivae and EOM are normal.  Cardiovascular: Normal rate, regular rhythm, normal heart sounds and intact distal pulses.   No murmur heard. Pulmonary/Chest: Effort normal and breath sounds normal. No respiratory distress. She has no wheezes.  Musculoskeletal: Normal range of motion. She exhibits no edema or tenderness.  Neurological: She is alert and oriented to person, place, and time. Coordination normal.  Skin: Skin is warm and dry. No rash noted. She is not diaphoretic.  Psychiatric: She has a normal mood and affect. Her behavior is normal.  Vitals reviewed.   Results for orders placed or performed in visit on 04/20/15  Veritor Flu A/B Waived  Result Value Ref Range   Influenza A Negative Negative   Influenza B Negative Negative      Assessment & Plan:       Problem List Items Addressed This Visit    None    Visit Diagnoses    Recurrent acute suppurative otitis media of right ear without spontaneous rupture of tympanic membrane    -  Primary    Relevant Medications    azithromycin (ZITHROMAX) 250 MG tablet        Follow up plan: Return if symptoms worsen or fail to improve.  Counseling provided for all of the vaccine components No orders of the defined types were placed in this encounter.    Caryl Pina, MD [facility] 05/25/2015, 6:41 PM

## 2015-05-26 ENCOUNTER — Telehealth: Payer: Self-pay | Admitting: Family Medicine

## 2015-05-26 NOTE — Telephone Encounter (Signed)
Patient aware.

## 2015-05-26 NOTE — Telephone Encounter (Signed)
Ear pain worse. Could you send in an ear drop that would help with pain?

## 2015-05-26 NOTE — Telephone Encounter (Signed)
Sweet oil and ibuprofen and tylenol, they discontinued the drops that they used to make

## 2015-07-09 ENCOUNTER — Other Ambulatory Visit: Payer: Self-pay | Admitting: Family

## 2015-08-16 ENCOUNTER — Encounter: Payer: Self-pay | Admitting: Family

## 2015-08-16 ENCOUNTER — Ambulatory Visit (INDEPENDENT_AMBULATORY_CARE_PROVIDER_SITE_OTHER): Payer: BLUE CROSS/BLUE SHIELD | Admitting: Family

## 2015-08-16 VITALS — BP 128/88 | HR 106 | Temp 98.8°F | Ht 63.0 in | Wt 286.6 lb

## 2015-08-16 DIAGNOSIS — J069 Acute upper respiratory infection, unspecified: Secondary | ICD-10-CM | POA: Diagnosis not present

## 2015-08-16 DIAGNOSIS — J029 Acute pharyngitis, unspecified: Secondary | ICD-10-CM

## 2015-08-16 LAB — CULTURE, GROUP A STREP

## 2015-08-16 LAB — RAPID STREP SCREEN (MED CTR MEBANE ONLY): STREP GP A AG, IA W/REFLEX: NEGATIVE

## 2015-08-16 NOTE — Progress Notes (Signed)
   Subjective:    Patient ID: Diana Avery, female    DOB: [birth year], 25 y.o.   MRN: [id number]  Headache   Associated symptoms include coughing, ear pain, neck pain and swollen glands.  Sore Throat   This is a new problem. The current episode started in the past 7 days. The problem has been gradually worsening. There has been no fever. The pain is at a severity of 7/10. The pain is moderate. Associated symptoms include coughing, ear pain, headaches, a hoarse voice, neck pain, shortness of breath, swollen glands and trouble swallowing. Pertinent negatives include no congestion, diarrhea, ear discharge or plugged ear sensation. She has tried acetaminophen and NSAIDs for the symptoms. The treatment provided mild relief.      Review of Systems  Constitutional: Negative.   HENT: Positive for ear pain, hoarse voice and trouble swallowing. Negative for congestion and ear discharge.   Eyes: Negative.   Respiratory: Positive for cough and shortness of breath.   Cardiovascular: Negative.  Negative for palpitations.  Gastrointestinal: Negative.  Negative for diarrhea.  Endocrine: Negative.   Genitourinary: Negative.   Musculoskeletal: Positive for neck pain.  Neurological: Positive for headaches.  Hematological: Negative.   Psychiatric/Behavioral: Negative.   All other systems reviewed and are negative.      Objective:   Physical Exam  Constitutional: She is oriented to person, place, and time. She appears well-developed and well-nourished. No distress.  HENT:  Head: Normocephalic and atraumatic.  Right Ear: External ear normal.  Mouth/Throat: Posterior oropharyngeal erythema present.  Eyes: Pupils are equal, round, and reactive to light.  Neck: Normal range of motion. Neck supple. No thyromegaly present.  Cardiovascular: Normal rate, regular rhythm, normal heart sounds and intact distal pulses.   No murmur heard. Pulmonary/Chest: Effort normal and breath sounds normal. No  respiratory distress. She has no wheezes.  Abdominal: Soft. Bowel sounds are normal. She exhibits no distension. There is no tenderness.  Musculoskeletal: Normal range of motion. She exhibits no edema or tenderness.  Neurological: She is alert and oriented to person, place, and time. She has normal reflexes. No cranial nerve deficit.  Skin: Skin is warm and dry.  Psychiatric: She has a normal mood and affect. Her behavior is normal. Judgment and thought content normal.  Vitals reviewed.     BP (!) 165/110   Pulse (!) 106   Temp 98.8 F (37.1 C) (Oral)   Ht 5\' 3"  (1.6 m)   Wt 286 lb 9.6 oz (130 kg)   BMI 50.77 kg/m      Assessment & Plan:  1. Sore throat - Rapid strep screen (not at [facility])  2. Acute upper respiratory infection -- Take meds as prescribed - Use a cool mist humidifier  -Use saline nose sprays frequently -Saline irrigations of the nose can be very helpful if done frequently.  * 4X daily for 1 week*  * Use of a nettie pot can be helpful with this. Follow directions with this* -Force fluids -For any cough or congestion  Use plain Mucinex- regular strength or max strength is fine   * Children- consult with Pharmacist for dosing -For fever or aces or pains- take tylenol or ibuprofen appropriate for age and weight.  * for fevers greater than 101 orally you may alternate ibuprofen and tylenol every  3 hours. -Throat lozenges if help -New toothbrush in 3 days    Evelina Dun, FNP

## 2015-08-16 NOTE — Patient Instructions (Addendum)
Upper Respiratory Infection, Adult Most upper respiratory infections (URIs) are a viral infection of the air passages leading to the lungs. A URI affects the nose, throat, and upper air passages. The most common type of URI is nasopharyngitis and is typically referred to as "the common cold." URIs run their course and usually go away on their own. Most of the time, a URI does not require medical attention, but sometimes a bacterial infection in the upper airways can follow a viral infection. This is called a secondary infection. Sinus and middle ear infections are common types of secondary upper respiratory infections. Bacterial pneumonia can also complicate a URI. A URI can worsen asthma and chronic obstructive pulmonary disease (COPD). Sometimes, these complications can require emergency medical care and may be life threatening.  CAUSES Almost all URIs are caused by viruses. A virus is a type of germ and can spread from one person to another.  RISKS FACTORS You may be at risk for a URI if:   You smoke.   You have chronic heart or lung disease.  You have a weakened defense (immune) system.   You are very young or very old.   You have nasal allergies or asthma.  You work in crowded or poorly ventilated areas.  You work in health care facilities or schools. SIGNS AND SYMPTOMS  Symptoms typically develop 2-3 days after you come in contact with a cold virus. Most viral URIs last 7-10 days. However, viral URIs from the influenza virus (flu virus) can last 14-18 days and are typically more severe. Symptoms may include:   Runny or stuffy (congested) nose.   Sneezing.   Cough.   Sore throat.   Headache.   Fatigue.   Fever.   Loss of appetite.   Pain in your forehead, behind your eyes, and over your cheekbones (sinus pain).  Muscle aches.  DIAGNOSIS  Your health care provider may diagnose a URI by:  Physical exam.  Tests to check that your symptoms are not due to  another condition such as:  Strep throat.  Sinusitis.  Pneumonia.  Asthma. TREATMENT  A URI goes away on its own with time. It cannot be cured with medicines, but medicines may be prescribed or recommended to relieve symptoms. Medicines may help:  Reduce your fever.  Reduce your cough.  Relieve nasal congestion. HOME CARE INSTRUCTIONS   Take medicines only as directed by your health care provider.   Gargle warm saltwater or take cough drops to comfort your throat as directed by your health care provider.  Use a warm mist humidifier or inhale steam from a shower to increase air moisture. This may make it easier to breathe.  Drink enough fluid to keep your urine clear or pale yellow.   Eat soups and other clear broths and maintain good nutrition.   Rest as needed.   Return to work when your temperature has returned to normal or as your health care provider advises. You may need to stay home longer to avoid infecting others. You can also use a face mask and careful hand washing to prevent spread of the virus.  Increase the usage of your inhaler if you have asthma.   Do not use any tobacco products, including cigarettes, chewing tobacco, or electronic cigarettes. If you need help quitting, ask your health care provider. PREVENTION  The best way to protect yourself from getting a cold is to practice good hygiene.   Avoid oral or hand contact with people with cold   symptoms.   Wash your hands often if contact occurs.  There is no clear evidence that vitamin C, vitamin E, echinacea, or exercise reduces the chance of developing a cold. However, it is always recommended to get plenty of rest, exercise, and practice good nutrition.  SEEK MEDICAL CARE IF:   You are getting worse rather than better.   Your symptoms are not controlled by medicine.   You have chills.  You have worsening shortness of breath.  You have brown or red mucus.  You have yellow or brown nasal  discharge.  You have pain in your face, especially when you bend forward.  You have a fever.  You have swollen neck glands.  You have pain while swallowing.  You have white areas in the back of your throat. SEEK IMMEDIATE MEDICAL CARE IF:   You have severe or persistent:  Headache.  Ear pain.  Sinus pain.  Chest pain.  You have chronic lung disease and any of the following:  Wheezing.  Prolonged cough.  Coughing up blood.  A change in your usual mucus.  You have a stiff neck.  You have changes in your:  Vision.  Hearing.  Thinking.  Mood. MAKE SURE YOU:   Understand these instructions.  Will watch your condition.  Will get help right away if you are not doing well or get worse.   This information is not intended to replace advice given to you by your health care provider. Make sure you discuss any questions you have with your health care provider.   Document Released: 07/05/2000 Document Revised: 05/26/2014 Document Reviewed: 04/16/2013 Elsevier Interactive Patient Education 2016 [employer]  - Take meds as prescribed - Use a cool mist humidifier  -Use saline nose sprays frequently -Saline irrigations of the nose can be very helpful if done frequently.  * 4X daily for 1 week*  * Use of a nettie pot can be helpful with this. Follow directions with this* -Force fluids -For any cough or congestion  Use plain Mucinex- regular strength or max strength is fine   * Children- consult with Pharmacist for dosing -For fever or aces or pains- take tylenol or ibuprofen appropriate for age and weight.  * for fevers greater than 101 orally you may alternate ibuprofen and tylenol every  3 hours. -Throat lozenges if help -New toothbrush in 3 days   Klye Besecker, FNP  

## 2015-08-24 ENCOUNTER — Telehealth: Payer: Self-pay | Admitting: Family

## 2015-08-24 MED ORDER — AZITHROMYCIN 250 MG PO TABS: ORAL_TABLET | ORAL | 0 refills | Status: DC

## 2015-08-24 NOTE — Telephone Encounter (Signed)
Zpak Prescription sent to pharmacy   

## 2015-08-24 NOTE — Telephone Encounter (Signed)
Patient aware.

## 2015-08-24 NOTE — Telephone Encounter (Signed)
Patient was seen 08/16/15 for sore throat and congestion. Patient states that she has been taking mucinex every 4-6hours. Patient states she is still having  Cough, congestion, and sore throat. Patient would like to know if something can be called in. Please advise.

## 2015-09-01 ENCOUNTER — Encounter: Payer: Self-pay | Admitting: Family Medicine

## 2015-09-01 ENCOUNTER — Ambulatory Visit (INDEPENDENT_AMBULATORY_CARE_PROVIDER_SITE_OTHER): Payer: BLUE CROSS/BLUE SHIELD | Admitting: Family Medicine

## 2015-09-01 VITALS — BP 136/85 | HR 83 | Temp 97.7°F | Ht 63.0 in | Wt 284.6 lb

## 2015-09-01 DIAGNOSIS — K2100 Gastro-esophageal reflux disease with esophagitis, without bleeding: Secondary | ICD-10-CM | POA: Insufficient documentation

## 2015-09-01 DIAGNOSIS — R7989 Other specified abnormal findings of blood chemistry: Secondary | ICD-10-CM | POA: Diagnosis not present

## 2015-09-01 DIAGNOSIS — E785 Hyperlipidemia, unspecified: Secondary | ICD-10-CM | POA: Diagnosis not present

## 2015-09-01 DIAGNOSIS — E039 Hypothyroidism, unspecified: Secondary | ICD-10-CM | POA: Diagnosis not present

## 2015-09-01 DIAGNOSIS — R945 Abnormal results of liver function studies: Secondary | ICD-10-CM

## 2015-09-01 MED ORDER — RANITIDINE HCL 150 MG PO TABS: 150.0000 mg | ORAL_TABLET | Freq: Two times a day (BID) | ORAL | 2 refills | Status: DC

## 2015-09-01 NOTE — Progress Notes (Signed)
BP 136/85 (BP Location: Left Arm, Patient Position: Sitting, Cuff Size: Large)   Pulse 83   Temp 97.7 F (36.5 C) (Oral)   Ht 5' 3"  (1.6 m)   Wt 284 lb 9.6 oz (129.1 kg)   LMP 08/12/2015 (Approximate)   BMI 50.41 kg/m    Subjective:    Patient ID: Diana Avery, female    DOB: 1990/04/22, 25 y.o.   MRN: [id number]  HPI: Diana Avery is a 25 y.o. female presenting on 09/01/2015 for Abdominal Pain (x 2 weeks, epigastric pain; patient is fasting); Nausea & vomiting, acid reflux (began 1 - 2 months ago,); and Anxiety and fatigue (not sleeping well, has not taken thyroid medication in 3 months)   HPI GERD Patient has heartburn and epigastric burning. She denies any blood in the stool or diarrhea or nausea vomiting. The burning sensation goes up into her throat sometimes. The pain does not radiate anywhere else.  Hypothyroidism Patient comes in for a thyroid recheck today. She is currently on 125 g of levothyroxine. We will check her blood work today and see where etc. She denies any issues with hair changes or fatigue or constipation or diarrhea or palpitations.  Hyperlipidemia Patient is coming in for cholesterol recheck today. She was supposed to be on simvastatin but never started taking it. She is coming in today to see if her diet and exercise changes have made sufficient difference that she doesn't need the medication.  Elevated liver tests Patient had elevated liver function tests on her last check and we will recheck today. She denies any symptoms of epigastric discomfort.  Relevant past medical, surgical, family and social history reviewed and updated as indicated. Interim medical history since our last visit reviewed. Allergies and medications reviewed and updated.  Review of Systems  Constitutional: Negative for chills and fever.  HENT: Negative for congestion, ear discharge and ear pain.   Eyes: Negative for redness and visual disturbance.  Respiratory:  Negative for chest tightness and shortness of breath.   Cardiovascular: Negative for chest pain and leg swelling.  Gastrointestinal: Positive for abdominal pain. Negative for constipation, diarrhea, nausea and vomiting.  Genitourinary: Negative for difficulty urinating and dysuria.  Musculoskeletal: Negative for back pain and gait problem.  Skin: Negative for rash.  Neurological: Negative for light-headedness and headaches.  Psychiatric/Behavioral: Negative for agitation, behavioral problems, decreased concentration, self-injury, sleep disturbance and suicidal ideas. The patient is not nervous/anxious.   All other systems reviewed and are negative.   Per HPI unless specifically indicated above     Medication List       Accurate as of 09/01/15 12:05 PM. Always use your most recent med list.          ALPRAZolam 0.5 MG tablet Commonly known as:  XANAX TAKE 1 TABLET BY MOUTH TWICE A DAY AS NEEDED FOR ANXIETY   cetirizine 10 MG tablet Commonly known as:  ZYRTEC TAKE 1 TABLET (10 MG TOTAL) BY MOUTH DAILY.   citalopram 40 MG tablet Commonly known as:  CELEXA Take 1 tablet (40 mg total) by mouth daily.   fluticasone 50 MCG/ACT nasal spray Commonly known as:  FLONASE Place 2 sprays into both nostrils daily.   levothyroxine 150 MCG tablet Commonly known as:  SYNTHROID Take 1 tablet (150 mcg total) by mouth daily before breakfast.   ranitidine 150 MG tablet Commonly known as:  ZANTAC Take 1 tablet (150 mg total) by mouth 2 (two) times daily.   simvastatin 20 MG  tablet Commonly known as:  ZOCOR Take 1 tablet (20 mg total) by mouth at bedtime.   SUMAtriptan 50 MG tablet Commonly known as:  IMITREX Take 1 tablet (50 mg total) by mouth every 2 (two) hours as needed for migraine. May repeat in 2 hours if headache persists or recurs          Objective:    BP 136/85 (BP Location: Left Arm, Patient Position: Sitting, Cuff Size: Large)   Pulse 83   Temp 97.7 F (36.5 C)  (Oral)   Ht 5' 3"  (1.6 m)   Wt 284 lb 9.6 oz (129.1 kg)   LMP 08/12/2015 (Approximate)   BMI 50.41 kg/m   Wt Readings from Last 3 Encounters:  09/01/15 284 lb 9.6 oz (129.1 kg)  08/16/15 286 lb 9.6 oz (130 kg)  05/25/15 285 lb (129.3 kg)    Physical Exam  Constitutional: She is oriented to person, place, and time. She appears well-developed and well-nourished. No distress.  Eyes: Conjunctivae and EOM are normal. Pupils are equal, round, and reactive to light.  Neck: Neck supple. No thyromegaly present.  Cardiovascular: Normal rate, regular rhythm, normal heart sounds and intact distal pulses.   No murmur heard. Pulmonary/Chest: Effort normal and breath sounds normal. No respiratory distress. She has no wheezes.  Abdominal: Soft. Bowel sounds are normal. She exhibits no distension. There is tenderness (Mild epigastric discomfort). There is no rebound and no guarding.  Musculoskeletal: Normal range of motion. She exhibits no edema or tenderness.  Lymphadenopathy:    She has no cervical adenopathy.  Neurological: She is alert and oriented to person, place, and time. Coordination normal.  Skin: Skin is warm and dry. No rash noted. She is not diaphoretic.  Psychiatric: She has a normal mood and affect. Her behavior is normal. Judgment and thought content normal.  Nursing note and vitals reviewed.     Assessment & Plan:   Problem List Items Addressed This Visit      Digestive   Gastroesophageal reflux disease with esophagitis - Primary   Relevant Medications   ranitidine (ZANTAC) 150 MG tablet   Other Relevant Orders   CMP14+EGFR     Endocrine   Hypothyroidism   Relevant Orders   CMP14+EGFR   TSH    Other Visit Diagnoses    Hyperlipidemia LDL goal <130       Checking lab only today, patient will follow-up in one month for regular visit   Relevant Orders   Lipid panel       Follow up plan: Return in about 4 weeks (around 09/29/2015), or if symptoms worsen or fail to  improve, for Follow-up thyroid and cholesterol and GERD.  Counseling provided for all of the vaccine components Orders Placed This Encounter  Procedures  . CMP14+EGFR  . Lipid panel  . TSH    Caryl Pina, MD [facility] 09/01/2015, 12:05 PM

## 2015-09-02 LAB — CMP14+EGFR
ALBUMIN: 4.3 g/dL (ref 3.5–5.5)
ALT: 156 IU/L — ABNORMAL HIGH (ref 0–32)
AST: 170 IU/L — ABNORMAL HIGH (ref 0–40)
Albumin/Globulin Ratio: 1.3 (ref 1.2–2.2)
Alkaline Phosphatase: 77 IU/L (ref 39–117)
BUN / CREAT RATIO: 19 (ref 9–23)
BUN: 12 mg/dL (ref 6–20)
Bilirubin Total: 0.5 mg/dL (ref 0.0–1.2)
CO2: 24 mmol/L (ref 18–29)
CREATININE: 0.62 mg/dL (ref 0.57–1.00)
Calcium: 9.5 mg/dL (ref 8.7–10.2)
Chloride: 98 mmol/L (ref 96–106)
GFR, EST AFRICAN AMERICAN: 144 mL/min/{1.73_m2} (ref >59)
GFR, EST NON AFRICAN AMERICAN: 125 mL/min/{1.73_m2} (ref >59)
GLOBULIN, TOTAL: 3.3 g/dL (ref 1.5–4.5)
GLUCOSE: 97 mg/dL (ref 65–99)
Potassium: 4.2 mmol/L (ref 3.5–5.2)
SODIUM: 137 mmol/L (ref 134–144)
TOTAL PROTEIN: 7.6 g/dL (ref 6.0–8.5)

## 2015-09-02 LAB — LIPID PANEL
CHOL/HDL RATIO: 4.2 ratio (ref 0.0–4.4)
Cholesterol, Total: 172 mg/dL (ref 100–199)
HDL: 41 mg/dL (ref >39)
LDL CALC: 112 mg/dL — AB (ref 0–99)
Triglycerides: 93 mg/dL (ref 0–149)
VLDL CHOLESTEROL CAL: 19 mg/dL (ref 5–40)

## 2015-09-02 LAB — TSH: TSH: 4.76 u[IU]/mL — AB (ref 0.450–4.500)

## 2015-09-02 MED ORDER — LEVOTHYROXINE SODIUM 137 MCG PO TABS
137.0000 ug | ORAL_TABLET | Freq: Every day | ORAL | 6 refills | Status: DC
Start: 2015-09-02 — End: 2015-10-05

## 2015-09-04 LAB — HEPATITIS PANEL, ACUTE
HEP A IGM: NEGATIVE
HEP B C IGM: NEGATIVE
HEP B S AG: NEGATIVE

## 2015-09-04 LAB — SPECIMEN STATUS REPORT

## 2015-09-08 ENCOUNTER — Ambulatory Visit (HOSPITAL_COMMUNITY)
Admission: RE | Admit: 2015-09-08 | Discharge: 2015-09-08 | Disposition: A | Discharge status: Home / Self Care | Payer: BLUE CROSS/BLUE SHIELD | Source: Ambulatory Visit | Attending: Family Medicine | Admitting: Family Medicine

## 2015-09-08 DIAGNOSIS — R945 Abnormal results of liver function studies: Secondary | ICD-10-CM

## 2015-09-08 DIAGNOSIS — K2100 Gastro-esophageal reflux disease with esophagitis, without bleeding: Secondary | ICD-10-CM

## 2015-09-08 DIAGNOSIS — R7989 Other specified abnormal findings of blood chemistry: Secondary | ICD-10-CM

## 2015-09-08 DIAGNOSIS — K760 Fatty (change of) liver, not elsewhere classified: Secondary | ICD-10-CM | POA: Insufficient documentation

## 2015-10-05 ENCOUNTER — Encounter: Payer: Self-pay | Admitting: Gynecology

## 2015-10-05 ENCOUNTER — Ambulatory Visit (INDEPENDENT_AMBULATORY_CARE_PROVIDER_SITE_OTHER): Payer: BLUE CROSS/BLUE SHIELD | Admitting: Gynecology

## 2015-10-05 ENCOUNTER — Other Ambulatory Visit: Payer: Self-pay | Admitting: *Deleted

## 2015-10-05 VITALS — BP 120/78

## 2015-10-05 DIAGNOSIS — N912 Amenorrhea, unspecified: Secondary | ICD-10-CM | POA: Diagnosis not present

## 2015-10-05 LAB — PREGNANCY, URINE: Preg Test, Ur: POSITIVE — AB

## 2015-10-05 MED ORDER — LEVOTHYROXINE SODIUM 137 MCG PO TABS: 137.0000 ug | ORAL_TABLET | Freq: Every day | ORAL | 1 refills | Status: DC

## 2015-10-05 NOTE — Progress Notes (Signed)
    Diana Avery [birth year] [id number]        25 y.o.  G2P0020 presents with LMP 08/20/2015. Had a little bit of spotting in August. Positive home pregnancy test. No cramping or bleeding now. Had chemical pregnancy beginning of this year with a positive home pregnancy test and a negative office visit test.   Past medical history,surgical history, problem list, medications, allergies, family history and social history were all reviewed and documented in the EPIC chart.  Directed ROS with pertinent positives and negatives documented in the history of present illness/assessment and plan.  Exam: Caryn Bee assistant Vitals:   10/05/15 1439  BP: 120/78   General appearance:  Normal Abdomen soft nontender without masses guarding rebound Pelvic external BUS vagina normal. Cervix normal. Uterus difficult to palpate but no gross masses or tenderness. Adnexa without gross masses or tenderness.  Assessment/Plan:  25 y.o. G2P0020 with positive pregnancy test. History of early loss prior pregnancy. Check quantitative hCG today. Discussed possible repeat in several days versus scheduling an ultrasound for viability. We'll await test results and go from there. Patient understands that we do not deliver babies and that she will need to establish care with obstetrical group in time for ongoing care. She is on a multivitamin with folic acid now.    Anastasio Auerbach MD, 3:13 PM 10/05/2015

## 2015-10-05 NOTE — Progress Notes (Signed)
Pt requesting 90 day supply of Levothyroxine Rx sent in per pt request Okayed per Dr Warrick Parisian

## 2015-10-05 NOTE — Patient Instructions (Signed)
Office will call you with the blood pregnancy test results.

## 2015-10-05 NOTE — Addendum Note (Signed)
Addended by: Nelva Nay on: 10/05/2015 03:16 PM   Modules accepted: Orders

## 2015-10-06 ENCOUNTER — Other Ambulatory Visit: Payer: Self-pay | Admitting: Gynecology

## 2015-10-06 DIAGNOSIS — Z369 Encounter for antenatal screening, unspecified: Secondary | ICD-10-CM

## 2015-10-06 DIAGNOSIS — O2621 Pregnancy care for patient with recurrent pregnancy loss, first trimester: Secondary | ICD-10-CM

## 2015-10-06 LAB — HCG, QUANTITATIVE, PREGNANCY: HCG, BETA CHAIN, QUANT, S: 1216.4 m[IU]/mL — AB

## 2015-10-06 NOTE — Progress Notes (Signed)
Patient informed. She knows appt desk receptionist will be calling her to schedule u/s.

## 2015-10-07 ENCOUNTER — Other Ambulatory Visit: Payer: Self-pay | Admitting: Family Medicine

## 2015-10-13 ENCOUNTER — Other Ambulatory Visit: Payer: Self-pay | Admitting: Gynecology

## 2015-10-13 ENCOUNTER — Ambulatory Visit (INDEPENDENT_AMBULATORY_CARE_PROVIDER_SITE_OTHER): Payer: BLUE CROSS/BLUE SHIELD | Admitting: Gynecology

## 2015-10-13 ENCOUNTER — Ambulatory Visit (INDEPENDENT_AMBULATORY_CARE_PROVIDER_SITE_OTHER): Payer: BLUE CROSS/BLUE SHIELD

## 2015-10-13 ENCOUNTER — Encounter: Payer: Self-pay | Admitting: Gynecology

## 2015-10-13 VITALS — BP 120/80

## 2015-10-13 DIAGNOSIS — O2621 Pregnancy care for patient with recurrent pregnancy loss, first trimester: Secondary | ICD-10-CM

## 2015-10-13 DIAGNOSIS — Z369 Encounter for antenatal screening, unspecified: Secondary | ICD-10-CM

## 2015-10-13 DIAGNOSIS — N83201 Unspecified ovarian cyst, right side: Secondary | ICD-10-CM

## 2015-10-13 DIAGNOSIS — O200 Threatened abortion: Secondary | ICD-10-CM

## 2015-10-13 NOTE — Patient Instructions (Signed)
Follow up in 2 weeks for your next ultrasound appointment

## 2015-10-13 NOTE — Progress Notes (Addendum)
    Eunice Zern 11/23/1990 [id number]        25 y.o.  G3P0020 presents for ultrasound. Patient had episode of bleeding this past weekend and she had an ultrasound done at [facility] emergency. They report a 5 week 2 day gestational sac without fetal pole. They question a trace of a subchorionic hemorrhage.  Past medical history,surgical history, problem list, medications, allergies, family history and social history were all reviewed and documented in the EPIC chart.  Directed ROS with pertinent positives and negatives documented in the history of present illness/assessment and plan.  Exam: Vitals:   10/13/15 0901  BP: 120/80   General appearance:  Normal  Ultrasound shows single viable fetus with positive cardiac activity. Estimated gestational age [redacted] weeks 5 days. Small cystic area adjacent to the gestational sac 4 x 4 mm questionable small bleed. Right and left ovaries normal with echo-free thin-walled right ovarian cystic area 39 x 38 x 37 mm.  Assessment/Plan:  25 y.o. G3P0020  with threatened AB. No bleeding since her exam previously. Ultrasound shows viable embryo 2 weeks behind dates. Small area adjacent questionable subchorionic bleed. Reviewed options for management. Also discussed the issues of threatened abortion. The need to report any bleeding or cramping discussed. Whether this could have been a small vanishing twin was also reviewed although I doubt it given no evidence of embryo in the small cystic area. Will re-ultrasound in 2 weeks to reassure ongoing viability and then she will make an appointment to see an obstetrician for ongoing obstetrical care.   Greater than 50% of my time was spent in direct face to face counseling and coordination of care with the patient.    Anastasio Auerbach MD, 9:31 AM 10/13/2015

## 2015-10-29 ENCOUNTER — Encounter: Payer: Self-pay | Admitting: Gynecology

## 2015-10-29 ENCOUNTER — Other Ambulatory Visit: Payer: Self-pay | Admitting: Gynecology

## 2015-10-29 ENCOUNTER — Ambulatory Visit (INDEPENDENT_AMBULATORY_CARE_PROVIDER_SITE_OTHER): Payer: BLUE CROSS/BLUE SHIELD

## 2015-10-29 ENCOUNTER — Ambulatory Visit (INDEPENDENT_AMBULATORY_CARE_PROVIDER_SITE_OTHER): Payer: BLUE CROSS/BLUE SHIELD | Admitting: Gynecology

## 2015-10-29 VITALS — BP 124/80

## 2015-10-29 DIAGNOSIS — O200 Threatened abortion: Secondary | ICD-10-CM

## 2015-10-29 DIAGNOSIS — O021 Missed abortion: Secondary | ICD-10-CM

## 2015-10-29 NOTE — Progress Notes (Signed)
    Diana Avery 07-Apr-1990 [id number]        25 y.o.  G3P0020 presents for follow up ultrasound.  Was seen 2 weeks ago after episode of spotting were ultrasound showed gestational age [redacted] weeks 5 days with positive cardiac activity. Small cystic area adjacent to the gestational sac questionable subchorionic bleed. Has had no bleeding or cramping since then.  Past medical history,surgical history, problem list, medications, allergies, family history and social history were all reviewed and documented in the EPIC chart.  Directed ROS with pertinent positives and negatives documented in the history of present illness/assessment and plan.  Exam: Vitals:   10/29/15 0858  BP: 124/80   General appearance:  Normal  Ultrasound shows 6 week 0 day fetal pole with no cardiac activity. Previous yolk sac not seen. Cystic area adjacent to gestational sac previously seen not present now. Right ovary with corpus luteum 22 x 19 mm. Left ovary normal  Assessment/Plan:  25 y.o. G3P0020 with missed AB. 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 All were spontaneously passed. I reviewed with patient and her husband the to issues as far as the acute management and the longer-term issue as far as workup for habitual abortion. Options now for D&C versus expectant management and spontaneous passage discussed. Benefits of both approaches as well as risks reviewed. Ability to retrieve genetic material possibly with D&C now.  At this point patient prefers expectant management with spontaneous passage as she has done previously and is comfortable with this approach. She will call me in one week regardless to let me know what she's doing. We'll then plan on habitual abortion workup. Patient's blood type is A+.    Anastasio Auerbach MD, 9:13 AM 10/29/2015

## 2015-10-29 NOTE — Patient Instructions (Signed)
Call me in one week to update me as to how you're doing.

## 2015-11-05 ENCOUNTER — Telehealth: Payer: Self-pay | Admitting: *Deleted

## 2015-11-05 ENCOUNTER — Telehealth: Payer: Self-pay | Admitting: Obstetrics and Gynecology

## 2015-11-05 NOTE — Telephone Encounter (Signed)
Phone call from patient after hours.   States she is having a miscarriage and was just seen at [facility] for heavy bleeding and was told to follow up with her gynecologist. Has a known SAB and was seen at [facility] where pelvic ultrasound showed SAB and no cardiac activity. Patient presented for care in [facility] today for her bleeding with golf ball sized clots.  Had an ultrasound there showing sac in the lower uterine segment and no fetal cardiac activity.  States her hemoglobin is 10. Denies dizziness.   [facility] records are unavailable in [place] for this evening's visit.  I reviewed choices for care: - observational management and passing tissue spontaneously - dilation and evacuation with potential risks of Asherman's, uterine perforation, bleeding, infection - prescription from the ER at [facility] for Cytotec  I welcomed her to [facility] in [place] for further assessment and treatment if she wishes. She indicates she wants to avoid dilation and evacuation procedure at this time.

## 2015-11-05 NOTE — Telephone Encounter (Signed)
Pt called to follow up from office visit on 10/29/15 for misses AB. Pt called today stating she is at working bleeding heavy, changing adult diaper every 1 hour, passing golf size clots. Pt said the heavy bleeding started yesterday (same as noted with changing every 1 hour) I explained to pt that Dr.Fontaine is out of the office this week and Dr.Fernendez has left for the day. I advised pt to go to [facility] hospital, pt said she lives in [place] and unable to drive to [employer]. Either way I explained pt to the need to go to hospital for exam. Pt verbalized she understood.

## 2015-11-08 ENCOUNTER — Ambulatory Visit (INDEPENDENT_AMBULATORY_CARE_PROVIDER_SITE_OTHER): Payer: BLUE CROSS/BLUE SHIELD | Admitting: Gynecology

## 2015-11-08 ENCOUNTER — Ambulatory Visit: Payer: BLUE CROSS/BLUE SHIELD | Admitting: Gynecology

## 2015-11-08 ENCOUNTER — Ambulatory Visit (INDEPENDENT_AMBULATORY_CARE_PROVIDER_SITE_OTHER): Payer: BLUE CROSS/BLUE SHIELD

## 2015-11-08 ENCOUNTER — Other Ambulatory Visit: Payer: Self-pay | Admitting: Gynecology

## 2015-11-08 ENCOUNTER — Telehealth: Payer: Self-pay

## 2015-11-08 ENCOUNTER — Encounter: Payer: Self-pay | Admitting: Gynecology

## 2015-11-08 VITALS — BP 140/86

## 2015-11-08 DIAGNOSIS — O034 Incomplete spontaneous abortion without complication: Secondary | ICD-10-CM | POA: Diagnosis not present

## 2015-11-08 DIAGNOSIS — O200 Threatened abortion: Secondary | ICD-10-CM | POA: Diagnosis not present

## 2015-11-08 DIAGNOSIS — O021 Missed abortion: Secondary | ICD-10-CM

## 2015-11-08 NOTE — Patient Instructions (Signed)
Call tomorrow and follow up to see how you're doing.

## 2015-11-08 NOTE — Telephone Encounter (Signed)
Patient advised. U/S scheduled at 4:00pm.  To see Dr. Loetta Rough after.

## 2015-11-08 NOTE — Telephone Encounter (Signed)
Schedule ultrasound now to see if she may not have passed it on her own.

## 2015-11-08 NOTE — Telephone Encounter (Signed)
Patient called stating she was seen in ER ([facility] in [facility]) this wekend and had missed ab. She was bleeding heavily and hgb was 10. They recommended D&C for missed ab. She called to schedule. Please advise.

## 2015-11-08 NOTE — Progress Notes (Signed)
    Diana Avery 06/24/1990 [id number]        25 y.o.  G3P0020 presents in follow up. Had diagnosed missed AB 2 weeks ago. Preferred expectant management. Began bleeding this past week which became very heavy on Friday. Was evaluated in the emergency room where ultrasound showed gestational sac low in the uterus with hemoglobin of 10. Options were reviewed with the patient and she elected for expectant management. She persistently has bled through the weekend and presents now for follow up.   Past medical history,surgical history, problem list, medications, allergies, family history and social history were all reviewed and documented in the EPIC chart.  Directed ROS with pertinent positives and negatives documented in the history of present illness/assessment and plan.  Exam: Caryn Bee assistant Vitals:   11/08/15 1622  BP: 140/86   General appearance:  Normal Abdomen soft nontender without masses guarding rebound Pelvic external BUS vagina with scant blood in the vault. Cervix closed. Uterus difficult to palpate but no gross masses or tenderness.  Ultrasound shows gestational sac seen within the cervix.  To sexual sac measures 7 weeks. Fetal pole 2.9 mm consistent with 5 weeks I've days. No fetal heart motion noted. No yolk sac. Right and left ovaries grossly normal. Cul-de-sac negative  Assessment/Plan:  25 y.o. G3P0020 with incomplete AB. Not excessively bleeding at this time. Options for management reviewed to include continued expectant management noting sac is within the cervix. Cytotec 400-800 g intravaginal or proceeding with D&C now. At this point patient prefers expectant management. She wants to avoid a D&C if at all possible. Wants to hold on Cytotec today. Will call tomorrow to let me know how she's doing. May consider Cytotec at that time. 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    Anastasio Auerbach MD, 5:09 PM  11/08/2015

## 2015-11-08 NOTE — Telephone Encounter (Signed)
Order placed

## 2015-11-12 ENCOUNTER — Telehealth: Payer: Self-pay | Admitting: *Deleted

## 2015-11-12 MED ORDER — MISOPROSTOL 200 MCG PO TABS: ORAL_TABLET | ORAL | 0 refills | Status: DC

## 2015-11-12 NOTE — Telephone Encounter (Signed)
Pt informed with the below note, Rx sent. 

## 2015-11-12 NOTE — Telephone Encounter (Signed)
I would recommend going ahead with the Cytotec 200 g tablets #4.  Sig: 2 per vagina now and repeat in 12 hours if without passage of tissue.

## 2015-11-12 NOTE — Telephone Encounter (Signed)
Called patient and she is still bleeding, doesn't believed she has miscarried yet, states no change.  Please advise

## 2015-11-19 ENCOUNTER — Telehealth: Payer: Self-pay | Admitting: *Deleted

## 2015-11-19 NOTE — Telephone Encounter (Signed)
Pt said she took 1 dose of Cytotec on 11/12/15 and was in terrible pain later that night, went to [facility] and [facility] was done on Monday 11/15/15. Patient said she forgot to call you and let you know.

## 2015-11-19 NOTE — Telephone Encounter (Signed)
-----   Message from Anastasio Auerbach, MD sent at 11/18/2015  4:14 PM EDT ----- Check with patient. I have not heard back from her as far as her bleeding and the miscarriage is concern. I thought that she was going to call us earlier this week

## 2016-01-06 ENCOUNTER — Ambulatory Visit (INDEPENDENT_AMBULATORY_CARE_PROVIDER_SITE_OTHER): Payer: BLUE CROSS/BLUE SHIELD | Admitting: Family

## 2016-01-06 ENCOUNTER — Encounter: Payer: Self-pay | Admitting: Family

## 2016-01-06 ENCOUNTER — Ambulatory Visit: Payer: BLUE CROSS/BLUE SHIELD | Admitting: Family

## 2016-01-06 VITALS — BP 153/95 | HR 95 | Temp 97.6°F | Ht 63.0 in | Wt 296.0 lb

## 2016-01-06 DIAGNOSIS — F411 Generalized anxiety disorder: Secondary | ICD-10-CM | POA: Diagnosis not present

## 2016-01-06 DIAGNOSIS — F331 Major depressive disorder, recurrent, moderate: Secondary | ICD-10-CM | POA: Diagnosis not present

## 2016-01-06 DIAGNOSIS — I10 Essential (primary) hypertension: Secondary | ICD-10-CM

## 2016-01-06 DIAGNOSIS — Z6841 Body Mass Index (BMI) 40.0 and over, adult: Secondary | ICD-10-CM

## 2016-01-06 DIAGNOSIS — J029 Acute pharyngitis, unspecified: Secondary | ICD-10-CM

## 2016-01-06 LAB — CULTURE, GROUP A STREP

## 2016-01-06 LAB — RAPID STREP SCREEN (MED CTR MEBANE ONLY): STREP GP A AG, IA W/REFLEX: NEGATIVE

## 2016-01-06 MED ORDER — CITALOPRAM HYDROBROMIDE 40 MG PO TABS: 40.0000 mg | ORAL_TABLET | Freq: Every day | ORAL | 0 refills | Status: DC

## 2016-01-06 MED ORDER — AZITHROMYCIN 250 MG PO TABS: ORAL_TABLET | ORAL | 0 refills | Status: DC

## 2016-01-06 MED ORDER — HYDROCHLOROTHIAZIDE 25 MG PO TABS: 25.0000 mg | ORAL_TABLET | Freq: Every day | ORAL | 3 refills | Status: DC

## 2016-01-06 NOTE — Progress Notes (Signed)
Subjective:    Patient ID: Diana Avery, female    DOB: 1990/09/27, 25 y.o.   MRN: [id number]  Sore Throat   This is a new problem. The current episode started yesterday. The problem has been gradually worsening. There has been no fever. The pain is at a severity of 4/10. The pain is mild. Associated symptoms include headaches, a hoarse voice, swollen glands and trouble swallowing. Pertinent negatives include no congestion, coughing, ear discharge or ear pain. She has tried gargles and acetaminophen for the symptoms. The treatment provided mild relief.  Hypertension  This is a new problem. The current episode started 1 to 4 weeks ago. The problem has been waxing and waning since onset. The problem is uncontrolled. Associated symptoms include headaches and malaise/fatigue. Pertinent negatives include no peripheral edema. The current treatment provides no improvement.  Depression         This is a recurrent problem.  The current episode started more than 1 year ago.   The onset quality is gradual.   The problem has been waxing and waning (has had 4 miscarriages) since onset.  Associated symptoms include decreased concentration, fatigue, helplessness, hopelessness, restlessness, decreased interest, headaches and sad.  Past treatments include nothing.     Review of Systems  Constitutional: Positive for fatigue and malaise/fatigue.  HENT: Positive for hoarse voice and trouble swallowing. Negative for congestion, ear discharge and ear pain.   Respiratory: Negative for cough.   Neurological: Positive for headaches.  Psychiatric/Behavioral: Positive for decreased concentration and depression.  All other systems reviewed and are negative.      Objective:   Physical Exam  Constitutional: She is oriented to person, place, and time. She appears well-developed and well-nourished. No distress.  HENT:  Head: Normocephalic and atraumatic.  Right Ear: External ear normal.  Nose: Mucosal edema  and rhinorrhea present.  Mouth/Throat: Oropharyngeal exudate, posterior oropharyngeal edema and posterior oropharyngeal erythema present.  Eyes: Pupils are equal, round, and reactive to light.  Neck: Normal range of motion. Neck supple. No thyromegaly present.  Cardiovascular: Normal rate, regular rhythm, normal heart sounds and intact distal pulses.   No murmur heard. Pulmonary/Chest: Effort normal and breath sounds normal. No respiratory distress. She has no wheezes.  Abdominal: Soft. Bowel sounds are normal. She exhibits no distension. There is no tenderness.  Musculoskeletal: Normal range of motion. She exhibits no edema or tenderness.  Neurological: She is alert and oriented to person, place, and time. She has normal reflexes. No cranial nerve deficit.  Skin: Skin is warm and dry.  Psychiatric: She has a normal mood and affect. Her behavior is normal. Judgment and thought content normal.  Vitals reviewed.     BP (!) 174/110   Pulse 94   Temp 97.6 F (36.4 C) (Oral)   Ht 5\' 3"  (1.6 m)   Wt 296 lb (134.3 kg)   Breastfeeding? Unknown   BMI 52.43 kg/m      Assessment & Plan:  1. Sore throat - Rapid strep screen (not at [facility])  2. Moderate episode of recurrent major depressive disorder (HCC) -Reordering celexa 40 mg, Pt to cut in 20mg  for one week Stress management discussed - citalopram (CELEXA) 40 MG tablet; Take 1 tablet (40 mg total) by mouth daily.  Dispense: 90 tablet; Refill: 0  3. GAD (generalized anxiety disorder) -Reordering celexa 40 mg, Pt to cut in 20mg  for one week Stress management discussed - citalopram (CELEXA) 40 MG tablet; Take 1 tablet (40 mg total) by  mouth daily.  Dispense: 90 tablet; Refill: 0  4. Morbid obesity with body mass index of 45.0-49.9 in adult ([place])  5. Acute pharyngitis, unspecified etiology - Take meds as prescribed - Use a cool mist humidifier  -Use saline nose sprays frequently -Saline irrigations of the nose can be very helpful if  done frequently.  * 4X daily for 1 week*  * Use of a nettie pot can be helpful with this. Follow directions with this* -Force fluids -For any cough or congestion  Use plain Mucinex- regular strength or max strength is fine   * Children- consult with Pharmacist for dosing -For fever or aces or pains- take tylenol or ibuprofen appropriate for age and weight.  * for fevers greater than 101 orally you may alternate ibuprofen and tylenol every  3 hours. -Throat lozenges if help -New toothbrush in 3 days - azithromycin (ZITHROMAX) 250 MG tablet; Take 500 mg once, then 250 mg for four days  Dispense: 6 tablet; Refill: 0  6. Essential hypertension -Pt started on HCTZ 25 mg today -Dash diet information given -Exercise encouraged - Stress Management  -Continue current meds -RTO in 2 weeks - hydrochlorothiazide (HYDRODIURIL) 25 MG tablet; Take 1 tablet (25 mg total) by mouth daily.  Dispense: 90 tablet; Refill: [place], FNP

## 2016-01-06 NOTE — Patient Instructions (Signed)
Hypertension Hypertension, commonly called high blood pressure, is when the force of blood pumping through your arteries is too strong. Your arteries are the blood vessels that carry blood from your heart throughout your body. A blood pressure reading consists of a higher number over a lower number, such as 110/72. The higher number (systolic) is the pressure inside your arteries when your heart pumps. The lower number (diastolic) is the pressure inside your arteries when your heart relaxes. Ideally you want your blood pressure below 120/80. Hypertension forces your heart to work harder to pump blood. Your arteries may become narrow or stiff. Having untreated or uncontrolled hypertension can cause heart attack, stroke, kidney disease, and other problems. What increases the risk? Some risk factors for high blood pressure are controllable. Others are not. Risk factors you cannot control include:  Race. You may be at higher risk if you are African American.  Age. Risk increases with age.  Gender. Men are at higher risk than women before age 45 years. After age 65, women are at higher risk than men. Risk factors you can control include:  Not getting enough exercise or physical activity.  Being overweight.  Getting too much fat, sugar, calories, or salt in your diet.  Drinking too much alcohol. What are the signs or symptoms? Hypertension does not usually cause signs or symptoms. Extremely high blood pressure (hypertensive crisis) may cause headache, anxiety, shortness of breath, and nosebleed. How is this diagnosed? To check if you have hypertension, your health care provider will measure your blood pressure while you are seated, with your arm held at the level of your heart. It should be measured at least twice using the same arm. Certain conditions can cause a difference in blood pressure between your right and left arms. A blood pressure reading that is higher than normal on one occasion does  not mean that you need treatment. If it is not clear whether you have high blood pressure, you may be asked to return on a different day to have your blood pressure checked again. Or, you may be asked to monitor your blood pressure at home for 1 or more weeks. How is this treated? Treating high blood pressure includes making lifestyle changes and possibly taking medicine. Living a healthy lifestyle can help lower high blood pressure. You may need to change some of your habits. Lifestyle changes may include:  Following the DASH diet. This diet is high in fruits, vegetables, and whole grains. It is low in salt, red meat, and added sugars.  Keep your sodium intake below 2,300 mg per day.  Getting at least 30-45 minutes of aerobic exercise at least 4 times per week.  Losing weight if necessary.  Not smoking.  Limiting alcoholic beverages.  Learning ways to reduce stress. Your health care provider may prescribe medicine if lifestyle changes are not enough to get your blood pressure under control, and if one of the following is true:  You are 18-59 years of age and your systolic blood pressure is above 140.  You are 60 years of age or older, and your systolic blood pressure is above 150.  Your diastolic blood pressure is above 90.  You have diabetes, and your systolic blood pressure is over 140 or your diastolic blood pressure is over 90.  You have kidney disease and your blood pressure is above 140/90.  You have heart disease and your blood pressure is above 140/90. Your personal target blood pressure may vary depending on your medical   conditions, your age, and other factors. Follow these instructions at home:  Have your blood pressure rechecked as directed by your health care provider.  Take medicines only as directed by your health care provider. Follow the directions carefully. Blood pressure medicines must be taken as prescribed. The medicine does not work as well when you skip  doses. Skipping doses also puts you at risk for problems.  Do not smoke.  Monitor your blood pressure at home as directed by your health care provider. Contact a health care provider if:  You think you are having a reaction to medicines taken.  You have recurrent headaches or feel dizzy.  You have swelling in your ankles.  You have trouble with your vision. Get help right away if:  You develop a severe headache or confusion.  You have unusual weakness, numbness, or feel faint.  You have severe chest or abdominal pain.  You vomit repeatedly.  You have trouble breathing. This information is not intended to replace advice given to you by your health care provider. Make sure you discuss any questions you have with your health care provider. Document Released: 01/09/2005 Document Revised: 06/17/2015 Document Reviewed: 11/01/2012 Elsevier Interactive Patient Education  2017 [employer]vier Inc.  

## 2016-02-03 ENCOUNTER — Other Ambulatory Visit: Payer: Self-pay | Admitting: Family

## 2016-02-04 NOTE — Telephone Encounter (Signed)
Rx called in 

## 2016-02-07 ENCOUNTER — Telehealth: Payer: Self-pay | Admitting: *Deleted

## 2016-02-07 NOTE — Telephone Encounter (Signed)
Pt called had D&C in early Nov due to miscarriage at [facility] , states she had cycle in dec 15-24 and has been spotting daily since Dec cycle. Not heavy using 2-3 tampons per day. Patient did not have post-op appointment. Pt would like to know your thoughts on this, office visit offered to patient. Please advise

## 2016-02-07 NOTE — Telephone Encounter (Signed)
Pt informed with the below note, will have front desk call to schedule. 

## 2016-02-07 NOTE — Telephone Encounter (Signed)
Exam appointment

## 2016-02-08 ENCOUNTER — Other Ambulatory Visit: Payer: Self-pay | Admitting: Gynecology

## 2016-02-08 ENCOUNTER — Telehealth: Payer: Self-pay | Admitting: *Deleted

## 2016-02-08 ENCOUNTER — Ambulatory Visit (INDEPENDENT_AMBULATORY_CARE_PROVIDER_SITE_OTHER): Payer: BLUE CROSS/BLUE SHIELD | Admitting: Gynecology

## 2016-02-08 ENCOUNTER — Encounter: Payer: Self-pay | Admitting: Gynecology

## 2016-02-08 VITALS — BP 124/78

## 2016-02-08 DIAGNOSIS — N926 Irregular menstruation, unspecified: Secondary | ICD-10-CM

## 2016-02-08 DIAGNOSIS — N950 Postmenopausal bleeding: Secondary | ICD-10-CM

## 2016-02-08 LAB — CBC WITH DIFFERENTIAL/PLATELET
BASOS PCT: 0 %
Basophils Absolute: 0 cells/uL (ref 0–200)
EOS ABS: 339 {cells}/uL (ref 15–500)
Eosinophils Relative: 3 %
HEMATOCRIT: 36.5 % (ref 35.0–45.0)
HEMOGLOBIN: 11.4 g/dL — AB (ref 11.7–15.5)
Lymphocytes Relative: 28 %
Lymphs Abs: 3164 cells/uL (ref 850–3900)
MCH: 23.6 pg — ABNORMAL LOW (ref 27.0–33.0)
MCHC: 31.2 g/dL — ABNORMAL LOW (ref 32.0–36.0)
MCV: 75.6 fL — AB (ref 80.0–100.0)
MONO ABS: 791 {cells}/uL (ref 200–950)
MPV: 9.7 fL (ref 7.5–12.5)
Monocytes Relative: 7 %
NEUTROS ABS: 7006 {cells}/uL (ref 1500–7800)
Neutrophils Relative %: 62 %
Platelets: 507 10*3/uL — ABNORMAL HIGH (ref 140–400)
RBC: 4.83 MIL/uL (ref 3.80–5.10)
RDW: 15.5 % — ABNORMAL HIGH (ref 11.0–15.0)
WBC: 11.3 10*3/uL — ABNORMAL HIGH (ref 3.8–10.8)

## 2016-02-08 LAB — TSH: TSH: 1.44 mIU/L

## 2016-02-08 NOTE — Patient Instructions (Signed)
Follow up for ultrasound as scheduled 

## 2016-02-08 NOTE — Progress Notes (Signed)
    Diana Avery 13-Oct-1990 [id number]        25 y.o.  G3P0020 presents having undergone D&C for missed AB in October. Bled heavily for 2 weeks afterwards. Had which thought was a regular menses 10 December but then has spotted on and off daily since then. No pain cramping fever chills.  Past medical history,surgical history, problem list, medications, allergies, family history and social history were all reviewed and documented in the EPIC chart.  Directed ROS with pertinent positives and negatives documented in the history of present illness/assessment and plan.  Exam: Caryn Bee assistant Vitals:   02/08/16 0850  BP: 124/78   General appearance:  Normal Abdomen soft nontender without masses guarding rebound Pelvic external BUS vagina normal without bleeding. Cervix grossly normal. Uterus grossly normal, difficult to palpate due to abdominal girth. No masses or tenderness on bimanual.  Assessment/Plan:  26 y.o. G3P0020 with persistent irregular bleeding following D&C for missed AB. Check baseline CBC hCG TSH and schedule sonohysterogram to rule out retained products. Patient will follow up for the ultrasound and then we will go from there.    Anastasio Auerbach MD, 9:02 AM 02/08/2016

## 2016-02-08 NOTE — Telephone Encounter (Signed)
Per Frankey Poot at [facility] ref# [id number] [facility] codes checked and covered 100% with $20 copay KW

## 2016-02-09 LAB — HCG, SERUM, QUALITATIVE: Preg, Serum: NEGATIVE

## 2016-02-15 ENCOUNTER — Telehealth: Payer: Self-pay | Admitting: *Deleted

## 2016-02-15 NOTE — Telephone Encounter (Signed)
Pt is scheduled for [facility] on 02/17/16, having very light spotting asked if okay to continue with procedure. I told pt okay to come on 02/17/16

## 2016-02-17 ENCOUNTER — Encounter: Payer: Self-pay | Admitting: Gynecology

## 2016-02-17 ENCOUNTER — Other Ambulatory Visit: Payer: Self-pay | Admitting: Gynecology

## 2016-02-17 ENCOUNTER — Ambulatory Visit (INDEPENDENT_AMBULATORY_CARE_PROVIDER_SITE_OTHER): Payer: BLUE CROSS/BLUE SHIELD | Admitting: Gynecology

## 2016-02-17 ENCOUNTER — Ambulatory Visit (INDEPENDENT_AMBULATORY_CARE_PROVIDER_SITE_OTHER): Payer: BLUE CROSS/BLUE SHIELD

## 2016-02-17 VITALS — BP 120/70

## 2016-02-17 DIAGNOSIS — N83202 Unspecified ovarian cyst, left side: Secondary | ICD-10-CM

## 2016-02-17 DIAGNOSIS — R938 Abnormal findings on diagnostic imaging of other specified body structures: Secondary | ICD-10-CM | POA: Diagnosis not present

## 2016-02-17 DIAGNOSIS — N831 Corpus luteum cyst of ovary, unspecified side: Secondary | ICD-10-CM | POA: Diagnosis not present

## 2016-02-17 DIAGNOSIS — N939 Abnormal uterine and vaginal bleeding, unspecified: Secondary | ICD-10-CM

## 2016-02-17 DIAGNOSIS — N950 Postmenopausal bleeding: Secondary | ICD-10-CM

## 2016-02-17 DIAGNOSIS — R9389 Abnormal findings on diagnostic imaging of other specified body structures: Secondary | ICD-10-CM

## 2016-02-17 DIAGNOSIS — N926 Irregular menstruation, unspecified: Secondary | ICD-10-CM | POA: Diagnosis not present

## 2016-02-17 NOTE — Progress Notes (Signed)
    Diana Avery 1990-06-10 [id number]        25 y.o.  G3P0020 presents for sonohysterogram. History of missed AB with initial expectant management that ultimately resulted in D&C due to heavy bleeding in October. She bled for about 2 weeks afterwards then stop bleeding and began again in December and his blood on and off since then. Also having some cramping.  Past medical history,surgical history, problem list, medications, allergies, family history and social history were all reviewed and documented in the EPIC chart.  Directed ROS with pertinent positives and negatives documented in the history of present illness/assessment and plan.  Exam: [place] assistant Vitals:   02/17/16 1700  BP: 120/70   General appearance:  Normal External BUS vagina with light menses flow. Cervix normal. Uterus difficult to palpate but no gross masses or tenderness.  Ultrasound transvaginal and transabdominal shows uterus anteverted with endometrial echo 7.2 mm. Endometrial cavity appears somewhat prominent. Right ovary with follicle 17 mm. Left ovary with classic appearing corpus luteal cyst 29 x 25 x 24 mm with peripheral color Doppler flow. Also with thin-walled cyst with internal echoes 14 x 10 mm. Cul-de-sac negative.  Sonohysterogram performed, sterile technique, easy catheter introduction, suboptimal distention with thickened anterior endometrial wall measuring 28 x 5 mm. Endometrial sample taken. Patient tolerated well.  Assessment/Plan:  25 y.o. G3P0020 with history of persistent bleeding after D&C. Endometrial echo measures 7.2 mm. On sonohysterogram there is some thickening along the anterior wall with questionable retained products. Will follow up on endometrial biopsy. Discussed various scenarios to include D&C. Patients very reluctant with this choice. Alternatives include attempted progesterone withdrawal to see if she can shed the lining. Possibly with the endometrial biopsy this may also  facilitate clearance of the endometrium. Will follow up in the biopsy results and then go from there.  Anastasio Auerbach MD, 5:12 PM 02/17/2016

## 2016-02-17 NOTE — Patient Instructions (Signed)
Office will call you with biopsy results 

## 2016-02-23 ENCOUNTER — Other Ambulatory Visit: Payer: Self-pay | Admitting: Gynecology

## 2016-02-23 MED ORDER — MEDROXYPROGESTERONE ACETATE 10 MG PO TABS: 10.0000 mg | ORAL_TABLET | Freq: Every day | ORAL | 0 refills | Status: DC

## 2016-02-28 ENCOUNTER — Encounter: Payer: Self-pay | Admitting: Gynecology

## 2016-02-29 NOTE — Telephone Encounter (Signed)
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 We did the ultrasound which showed the cavity to be normal. Additional workup would be peripheral karyotype on the patient and her husband, anticardiolipin antibodies (IgG, IgM) and lupus anticoagulant. She recently had a thyroid check and this does not need to be repeated. My recommendation would be to do the above testing and to see what the next several cycles are like and to try spontaneously. We can consider Clomid if it looks like she is not ovulating on a regular basis. If she is ovulating then Clomid will not improve pregnancy chances. By losing weight she also is significantly improving her ovulatory likelihood.

## 2016-02-29 NOTE — Telephone Encounter (Signed)
D.r TF- Is the "peripheral karotype on the patient and her husband" something we do here in our lab?

## 2016-03-03 ENCOUNTER — Telehealth: Payer: Self-pay

## 2016-03-03 ENCOUNTER — Other Ambulatory Visit: Payer: Self-pay

## 2016-03-03 ENCOUNTER — Other Ambulatory Visit: Payer: Self-pay | Admitting: Gynecology

## 2016-03-03 NOTE — Telephone Encounter (Signed)
Per Dr. 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 We did the ultrasound which showed the cavity to be normal. Additional workup would be peripheral karyotype on the patient and her husband, anticardiolipin antibodies (IgG, IgM) and lupus anticoagulant. She recently had a thyroid check and this does not need to be repeated. My recommendation would be to do the above testing and to see what the next several cycles are like and to try spontaneously. We can consider Clomid if it looks like she is not ovulating on a regular basis. If she is ovulating then Clomid will not improve pregnancy chances. By losing weight she also is significantly improving her ovulatory likelihood."  I called patient to explain we cannot draw husband's chromosome study here.  After discussion I am going to mail lab orders to her and they are going to go together to [facility] lab to have labs drawn and results will be faxed here.  Patient has questions for Dr. Loetta Rough:  #1  Dr. Loetta Rough wrote "see what the next several cycles are like and to try spontaneously. We can consider Clomid if it looks like she is not ovulating on a regular basis. If she is ovulating then Clomid will not improve pregnancy chances."   Patient asks how are you going to know whether she ovulates or not. Her menses are not regular. Sometimes 20 days sometimes 35-40 days.   #2 She questions if you could do follicle study to help her figure out if she is ovulating. She said she has checked and her ins covers this.  #3 She asked about doing IUI?

## 2016-03-03 NOTE — Telephone Encounter (Signed)
Patient informed. She wants to hold on referral to Dr. Darreld Mclean right now. She will call with Day one of her spontaneous menses so that she can schedule u/s for follicle study.

## 2016-03-03 NOTE — Telephone Encounter (Signed)
#  1 we do not do IUI. we referred to Dr. Kerin Perna if needed.   #2 if her cycles jump around at is an indication of irregular ovulation and and indication for Clomid.   #3 she at least needs to have one spontaneous  menses, not Provera withdrawn to be able to time the ultrasound. otherwise it would be a real hit or miss  #4 if she would like we could refer her now to Dr. Kerin Perna

## 2016-04-02 ENCOUNTER — Other Ambulatory Visit: Payer: Self-pay | Admitting: Family

## 2016-04-02 DIAGNOSIS — F331 Major depressive disorder, recurrent, moderate: Secondary | ICD-10-CM

## 2016-04-02 DIAGNOSIS — F411 Generalized anxiety disorder: Secondary | ICD-10-CM

## 2016-04-06 ENCOUNTER — Encounter: Payer: Self-pay | Admitting: Gynecology

## 2016-04-07 ENCOUNTER — Other Ambulatory Visit: Payer: Self-pay | Admitting: Gynecology

## 2016-04-07 MED ORDER — MEDROXYPROGESTERONE ACETATE 10 MG PO TABS: 10.0000 mg | ORAL_TABLET | Freq: Every day | ORAL | 0 refills | Status: DC

## 2016-04-07 NOTE — Telephone Encounter (Signed)
Okay for Provera 10 mg daily 10 days. Call in follow up as far as what happens with bleeding. We can then decide about timing an ultrasound.

## 2016-04-17 ENCOUNTER — Telehealth: Payer: Self-pay | Admitting: *Deleted

## 2016-04-17 DIAGNOSIS — N979 Female infertility, unspecified: Secondary | ICD-10-CM

## 2016-04-17 NOTE — Telephone Encounter (Signed)
Pt aware, order placed 

## 2016-04-17 NOTE — Telephone Encounter (Signed)
Schedule ultrasound 11-13 days from now whatever works into the schedule.

## 2016-04-17 NOTE — Telephone Encounter (Signed)
Pt started provera, cycle started today, told to call per note "Okay for Provera 10 mg daily 10 days. Call in follow up as far as what happens with bleeding. We can then decide about timing an ultrasound."    Pt also mention that the provera caused migraines and also had 1 episode of vomiting. \  Please advise

## 2016-04-27 ENCOUNTER — Ambulatory Visit (INDEPENDENT_AMBULATORY_CARE_PROVIDER_SITE_OTHER): Payer: BLUE CROSS/BLUE SHIELD

## 2016-04-27 ENCOUNTER — Ambulatory Visit (INDEPENDENT_AMBULATORY_CARE_PROVIDER_SITE_OTHER): Payer: BLUE CROSS/BLUE SHIELD | Admitting: Gynecology

## 2016-04-27 VITALS — BP 122/78

## 2016-04-27 DIAGNOSIS — N926 Irregular menstruation, unspecified: Secondary | ICD-10-CM | POA: Diagnosis not present

## 2016-04-27 DIAGNOSIS — N979 Female infertility, unspecified: Secondary | ICD-10-CM

## 2016-04-27 NOTE — Patient Instructions (Addendum)
Follow up at the end of this cycle.

## 2016-04-27 NOTE — Progress Notes (Signed)
    Diana Avery 1990-02-25 [id number]        26 y.o.  G3P0020 patient presents for follicle study. Last menstrual period 26 March after Provera.  Past medical history,surgical history, problem list, medications, allergies, family history and social history were all reviewed and documented in the EPIC chart.  Directed ROS with pertinent positives and negatives documented in the history of present illness/assessment and plan.  Exam: Vitals:   04/27/16 1559  BP: 122/78   General appearance:  Normal  Sound transvaginal shows uterus anteverted with normal echotexture. Try layered endometrium 8.8 mm. Right ovary with 20.7 mm and 15.9 mm follicle. Left ovary with 45OP and 7.8 mm follicle. Numerous other smaller follicles in both ovaries. Cul-de-sac negative  Assessment/Plan:  26 y.o. G3P0020 with several follicles bilaterally. Patient will spontaneously have intercourse over the next week and follow up at the end of the cycle. I again encouraged her to consider following up with reproductive endocrinology for a more aggressive approach towards fertility. At this point patient is not interested but prefers to monitor and continue with weight loss for now.    Anastasio Auerbach MD, 4:13 PM 04/27/2016

## 2016-04-28 ENCOUNTER — Telehealth: Payer: Self-pay | Admitting: Family

## 2016-04-28 MED ORDER — DICLOFENAC POTASSIUM(MIGRAINE) 50 MG PO PACK: 50.0000 mg | PACK | Freq: Once | ORAL | 1 refills | Status: AC

## 2016-04-28 MED ORDER — SUMATRIPTAN SUCCINATE 50 MG PO TABS: 50.0000 mg | ORAL_TABLET | ORAL | 0 refills | Status: DC | PRN

## 2016-04-28 NOTE — Telephone Encounter (Signed)
Pt needs RX for migraines Please advise It is in pt's history

## 2016-04-28 NOTE — Telephone Encounter (Signed)
Imitrex sent to pharmacy per Dr. Evette Doffing

## 2016-04-28 NOTE — Telephone Encounter (Signed)
Prescription sent to pharmacy.

## 2016-04-28 NOTE — Telephone Encounter (Signed)
Patient aware.

## 2016-05-08 ENCOUNTER — Other Ambulatory Visit: Payer: Self-pay | Admitting: Family

## 2016-05-08 DIAGNOSIS — F411 Generalized anxiety disorder: Secondary | ICD-10-CM

## 2016-05-08 DIAGNOSIS — F331 Major depressive disorder, recurrent, moderate: Secondary | ICD-10-CM

## 2016-05-10 ENCOUNTER — Telehealth: Payer: Self-pay

## 2016-05-10 DIAGNOSIS — N970 Female infertility associated with anovulation: Secondary | ICD-10-CM

## 2016-05-10 NOTE — Telephone Encounter (Signed)
Patient wants to know if there is a way to find out if she ovulated this cycle. Ov predictor kit did not show that she ovulated this mos. Did it every day and sometimes twice daily. She asked if lab work that could indicate. She said that is her only problem with just giving spontaneous trial a chance is that she does not believe she is ovulating.

## 2016-05-10 NOTE — Telephone Encounter (Signed)
Patient called to report that her menses started yesterday 05/09/16.  She wanted me to check with you regarding what next step will be?

## 2016-05-10 NOTE — Telephone Encounter (Signed)
At this point my recommendation would be to continue trying the next several months and keep a menstrual calendar to see the frequency of her menses. I'm not sure active intervention at this point following a spontaneous pregnancy 6 months ago would be indicated. If she would continue without pregnancy then I would recommend a follow up with Dr. Kerin Perna. But I would give spontaneous trial a chance for right now.

## 2016-05-10 NOTE — Telephone Encounter (Signed)
She could check a progesterone level around day 21-24

## 2016-05-11 ENCOUNTER — Other Ambulatory Visit: Payer: Self-pay | Admitting: Gynecology

## 2016-05-11 NOTE — Telephone Encounter (Signed)
Patient informed. Order placed and lab appt scheduled for 05/30/16.

## 2016-05-30 ENCOUNTER — Other Ambulatory Visit: Payer: BLUE CROSS/BLUE SHIELD

## 2016-05-30 DIAGNOSIS — N970 Female infertility associated with anovulation: Secondary | ICD-10-CM

## 2016-05-30 LAB — PROGESTERONE: Progesterone: 6 ng/mL

## 2016-06-07 ENCOUNTER — Encounter: Payer: Self-pay | Admitting: Gynecology

## 2016-07-26 ENCOUNTER — Other Ambulatory Visit: Payer: Self-pay | Admitting: Family Medicine

## 2016-08-26 ENCOUNTER — Other Ambulatory Visit: Payer: Self-pay | Admitting: Family

## 2016-09-22 ENCOUNTER — Encounter: Payer: Self-pay | Admitting: Family

## 2016-09-22 ENCOUNTER — Ambulatory Visit (INDEPENDENT_AMBULATORY_CARE_PROVIDER_SITE_OTHER): Payer: Self-pay | Admitting: Family

## 2016-09-22 VITALS — BP 137/83 | HR 98 | Temp 98.3°F | Ht 63.0 in | Wt 289.6 lb

## 2016-09-22 DIAGNOSIS — N3000 Acute cystitis without hematuria: Secondary | ICD-10-CM

## 2016-09-22 DIAGNOSIS — R399 Unspecified symptoms and signs involving the genitourinary system: Secondary | ICD-10-CM

## 2016-09-22 DIAGNOSIS — I10 Essential (primary) hypertension: Secondary | ICD-10-CM

## 2016-09-22 DIAGNOSIS — E039 Hypothyroidism, unspecified: Secondary | ICD-10-CM

## 2016-09-22 DIAGNOSIS — F411 Generalized anxiety disorder: Secondary | ICD-10-CM

## 2016-09-22 LAB — URINALYSIS, COMPLETE
Bilirubin, UA: NEGATIVE
Glucose, UA: NEGATIVE
Ketones, UA: NEGATIVE
Leukocytes, UA: NEGATIVE
Nitrite, UA: NEGATIVE
PH UA: 6 (ref 5.0–7.5)
Protein, UA: NEGATIVE
Specific Gravity, UA: 1.02 (ref 1.005–1.030)
Urobilinogen, Ur: 0.2 mg/dL (ref 0.2–1.0)

## 2016-09-22 LAB — MICROSCOPIC EXAMINATION: RENAL EPITHEL UA: NONE SEEN /HPF

## 2016-09-22 MED ORDER — LEVOTHYROXINE SODIUM 137 MCG PO TABS: 137.0000 ug | ORAL_TABLET | Freq: Every day | ORAL | 0 refills | Status: DC

## 2016-09-22 MED ORDER — CIPROFLOXACIN HCL 500 MG PO TABS: 500.0000 mg | ORAL_TABLET | Freq: Two times a day (BID) | ORAL | 0 refills | Status: DC

## 2016-09-22 MED ORDER — ALPRAZOLAM 0.5 MG PO TABS: ORAL_TABLET | ORAL | 5 refills | Status: DC

## 2016-09-22 MED ORDER — HYDROCHLOROTHIAZIDE 25 MG PO TABS: 25.0000 mg | ORAL_TABLET | Freq: Every day | ORAL | 3 refills | Status: DC

## 2016-09-22 NOTE — Addendum Note (Signed)
Addended by: Evelina Dun A on: 09/22/2016 08:51 AM   Modules accepted: Orders

## 2016-09-22 NOTE — Patient Instructions (Signed)

## 2016-09-22 NOTE — Progress Notes (Addendum)
Subjective:    Patient ID: Diana Avery, female    DOB: [birth year], 26 y.o.   MRN: [id number]  Abdominal Cramping  Associated symptoms include dysuria, frequency and nausea. Pertinent negatives include no diarrhea, hematuria or vomiting.  Dysuria   This is a new problem. The current episode started 1 to 4 weeks ago. The problem occurs every urination. The problem has been unchanged. The quality of the pain is described as burning. The pain is at a severity of 3/10. The pain is mild. Associated symptoms include frequency, hesitancy, nausea and urgency. Pertinent negatives include no discharge, flank pain, hematuria or vomiting. She has tried increased fluids for the symptoms. The treatment provided mild relief.  Hypertension  This is a chronic problem. The current episode started more than 1 year ago. The problem has been resolved since onset. The problem is controlled. Associated symptoms include anxiety. Pertinent negatives include no peripheral edema or shortness of breath. The current treatment provides moderate improvement. There is no history of kidney disease or CAD/MI. Identifiable causes of hypertension include a thyroid problem.  Anxiety  Presents for follow-up visit. Symptoms include excessive worry, irritability, nausea and nervous/anxious behavior. Patient reports no shortness of breath. Symptoms occur most days.    Thyroid Problem  Presents for follow-up visit. Symptoms include anxiety. Patient reports no diarrhea, fatigue or visual change. The symptoms have been stable.      Review of Systems  Constitutional: Positive for irritability. Negative for fatigue.  Respiratory: Negative for shortness of breath.   Gastrointestinal: Positive for nausea. Negative for diarrhea and vomiting.  Genitourinary: Positive for dysuria, frequency, hesitancy and urgency. Negative for flank pain and hematuria.  Psychiatric/Behavioral: The patient is nervous/anxious.   All other systems  reviewed and are negative.      Objective:   Physical Exam  Constitutional: She is oriented to person, place, and time. She appears well-developed and well-nourished. No distress.  Cardiovascular: Normal rate, regular rhythm, normal heart sounds and intact distal pulses.   No murmur heard. Pulmonary/Chest: Effort normal and breath sounds normal. No respiratory distress. She has no wheezes.  Abdominal: Soft. Bowel sounds are normal. She exhibits no distension. There is tenderness (milid lower abd tenderness).  Musculoskeletal: Normal range of motion. She exhibits no edema or tenderness.  Neurological: She is alert and oriented to person, place, and time.  Skin: Skin is warm and dry.  Psychiatric: She has a normal mood and affect. Her behavior is normal. Judgment and thought content normal.  Vitals reviewed.   BP 137/83   Pulse 98   Temp 98.3 F (36.8 C) (Oral)   Ht 5\' 3"  (1.6 m)   Wt 289 lb 9.6 oz (131.4 kg)   BMI 51.30 kg/m       Assessment & Plan:  1. UTI symptoms - Urinalysis, Complete  2. Acute cystitis without hematuria Force fluids AZO over the counter X2 days RTO prn - ciprofloxacin (CIPRO) 500 MG tablet; Take 1 tablet (500 mg total) by mouth 2 (two) times daily.  Dispense: 14 tablet; Refill: 0  3. Essential hypertension - hydrochlorothiazide (HYDRODIURIL) 25 MG tablet; Take 1 tablet (25 mg total) by mouth daily.  Dispense: 90 tablet; Refill: 3  4. Hypothyroidism, unspecified type - levothyroxine (SYNTHROID, LEVOTHROID) 137 MCG tablet; Take 1 tablet (137 mcg total) by mouth daily before breakfast.  Dispense: 90 tablet; Refill: 0  5. GAD (generalized anxiety disorder) - ALPRAZolam (XANAX) 0.5 MG tablet; TAKE 1 TABLET TWICE A DAY AS NEEDED FOR  ANXIETY  Dispense: 45 tablet; Refill: [place], FNP

## 2016-10-04 ENCOUNTER — Telehealth: Payer: BLUE CROSS/BLUE SHIELD | Admitting: Family

## 2016-10-04 DIAGNOSIS — R399 Unspecified symptoms and signs involving the genitourinary system: Secondary | ICD-10-CM

## 2016-10-04 DIAGNOSIS — R1030 Lower abdominal pain, unspecified: Secondary | ICD-10-CM

## 2016-10-04 NOTE — Progress Notes (Signed)
Based on what you shared with me it looks like you have a serious condition that should be evaluated in a face to face office visit.  NOTE: Even if you have entered your credit card information for this eVisit, you will not be charged.   If you are having a true medical emergency please call 911.  If you need an urgent face to face visit, [facility] has four urgent care centers for your convenience.  If you need care fast and have a high deductible or no insurance consider:   [email or web address]  [phone]  [address], [address] [place], [place] [address] 8 am to 8 pm Monday-Friday 10 am to 4 pm Saturday-Sunday   The following sites will take your  insurance:    . [facility]  [phone] Get Driving Directions Find a Provider at this Location  [address] [place], [place] [address] . 10 am to 8 pm Monday-Friday . 12 pm to 8 pm Saturday-Sunday   . [facility]  [phone] Get Driving Directions Find a Provider at this Location  [address], [address] [place], [place] [address] . 8 am to 8 pm Monday-Friday . 9 am to 6 pm Saturday . 11 am to 6 pm Sunday   . [facility] [place]  [phone] Get Driving Directions  [address] [place], [place] [address] . 8 am to 8 pm Monday-Friday . 8 am to 4 pm Saturday-Sunday   Your e-visit answers were reviewed by a board certified advanced clinical practitioner to complete your personal care plan.  Thank you for using e-Visits.  

## 2016-10-27 ENCOUNTER — Other Ambulatory Visit: Payer: Self-pay | Admitting: Family

## 2017-04-07 ENCOUNTER — Other Ambulatory Visit: Payer: Self-pay | Admitting: Family

## 2017-04-07 DIAGNOSIS — F411 Generalized anxiety disorder: Secondary | ICD-10-CM

## 2017-04-09 NOTE — Telephone Encounter (Signed)
Last seen 09/22/16  [facility]

## 2017-05-07 ENCOUNTER — Encounter: Payer: Self-pay | Admitting: Family

## 2017-05-07 ENCOUNTER — Ambulatory Visit: Payer: BC Managed Care – PPO | Admitting: Family

## 2017-05-07 VITALS — BP 130/88 | HR 87 | Temp 99.4°F | Ht 62.0 in | Wt 298.2 lb

## 2017-05-07 DIAGNOSIS — E785 Hyperlipidemia, unspecified: Secondary | ICD-10-CM

## 2017-05-07 DIAGNOSIS — I10 Essential (primary) hypertension: Secondary | ICD-10-CM

## 2017-05-07 DIAGNOSIS — E8881 Metabolic syndrome: Secondary | ICD-10-CM

## 2017-05-07 DIAGNOSIS — Z6841 Body Mass Index (BMI) 40.0 and over, adult: Secondary | ICD-10-CM | POA: Diagnosis not present

## 2017-05-07 DIAGNOSIS — K2100 Gastro-esophageal reflux disease with esophagitis, without bleeding: Secondary | ICD-10-CM

## 2017-05-07 DIAGNOSIS — F411 Generalized anxiety disorder: Secondary | ICD-10-CM | POA: Diagnosis not present

## 2017-05-07 DIAGNOSIS — E039 Hypothyroidism, unspecified: Secondary | ICD-10-CM

## 2017-05-07 DIAGNOSIS — F331 Major depressive disorder, recurrent, moderate: Secondary | ICD-10-CM | POA: Diagnosis not present

## 2017-05-07 MED ORDER — ALPRAZOLAM 0.5 MG PO TABS: ORAL_TABLET | ORAL | 5 refills | Status: DC

## 2017-05-07 MED ORDER — BUPROPION HCL ER (SR) 150 MG PO TB12: 150.0000 mg | ORAL_TABLET | Freq: Two times a day (BID) | ORAL | 1 refills | Status: DC

## 2017-05-07 NOTE — Progress Notes (Signed)
Subjective:    Patient ID: Diana Avery, female    DOB: 1990/08/07, 27 y.o.   MRN: [id number]  PT presents to the office today for chronic follow up. States her anxiety and depression are worse.  Hypertension  This is a chronic problem. The current episode started more than 1 year ago. The problem has been resolved since onset. The problem is controlled. Associated symptoms include anxiety and malaise/fatigue. Pertinent negatives include no peripheral edema or shortness of breath. Risk factors for coronary artery disease include dyslipidemia, obesity and sedentary lifestyle. The current treatment provides moderate improvement. Identifiable causes of hypertension include a thyroid problem.  Hyperlipidemia  This is a chronic problem. The current episode started more than 1 year ago. The problem is uncontrolled. Recent lipid tests were reviewed and are high. Exacerbating diseases include obesity. Pertinent negatives include no shortness of breath. Current antihyperlipidemic treatment includes statins. The current treatment provides mild improvement of lipids. Risk factors for coronary artery disease include dyslipidemia, hypertension and post-menopausal.  Thyroid Problem  Presents for follow-up visit. Symptoms include anxiety, depressed mood and fatigue. The symptoms have been stable. Her past medical history is significant for hyperlipidemia.  Anxiety  Presents for follow-up visit. Symptoms include decreased concentration, depressed mood, excessive worry, irritability, nervous/anxious behavior and restlessness. Patient reports no shortness of breath. Symptoms occur most days. The severity of symptoms is severe. The quality of sleep is good.    Depression         This is a chronic problem.  The current episode started more than 1 year ago.   The onset quality is gradual.   The problem occurs intermittently.  Associated symptoms include decreased concentration, fatigue and restlessness.  Past  treatments include nothing.  Past medical history includes thyroid problem and anxiety.       Review of Systems  Constitutional: Positive for fatigue, irritability and malaise/fatigue.  Respiratory: Negative for shortness of breath.   Psychiatric/Behavioral: Positive for decreased concentration and depression. The patient is nervous/anxious.   All other systems reviewed and are negative.      Objective:   Physical Exam  Constitutional: She is oriented to person, place, and time. She appears well-developed and well-nourished. No distress.  Morbid obese   HENT:  Head: Normocephalic and atraumatic.  Right Ear: External ear normal.  Left Ear: External ear normal.  Nose: Nose normal.  Mouth/Throat: Oropharynx is clear and moist.  Eyes: Pupils are equal, round, and reactive to light.  Neck: Normal range of motion. Neck supple. No thyromegaly present.  Cardiovascular: Normal rate, regular rhythm, normal heart sounds and intact distal pulses.  No murmur heard. Pulmonary/Chest: Effort normal and breath sounds normal. No respiratory distress. She has no wheezes.  Abdominal: Soft. Bowel sounds are normal. She exhibits no distension. There is no tenderness.  Musculoskeletal: Normal range of motion. She exhibits no edema or tenderness.  Neurological: She is alert and oriented to person, place, and time.  Skin: Skin is warm and dry.  Psychiatric: Her behavior is normal. Judgment and thought content normal. Her mood appears anxious.  tearful  Vitals reviewed.     BP 130/88   Pulse 87   Temp 99.4 F (37.4 C) (Oral)   Ht 5' 2" (1.575 m)   Wt 298 lb 3.2 oz (135.3 kg)   BMI 54.54 kg/m      Assessment & Plan:  1. Essential hypertension - CMP14+EGFR  2. Gastroesophageal reflux disease with esophagitis - CMP14+EGFR  3. Hypothyroidism, unspecified type  -  CMP14+EGFR - TSH  4. Morbid obesity with BMI of 50.0-59.9, adult (HCC) - TDD22+GURK  5. Metabolic syndrome -  YHC62+BJSE  6. Hyperlipidemia, unspecified hyperlipidemia type - CMP14+EGFR - Lipid panel  7. GAD (generalized anxiety disorder) Will start Wellbutrin today Stress management discussed - CMP14+EGFR - buPROPion (WELLBUTRIN SR) 150 MG 12 hr tablet; Take 1 tablet (150 mg total) by mouth 2 (two) times daily.  Dispense: 180 tablet; Refill: 1 - ALPRAZolam (XANAX) 0.5 MG tablet; TAKE 1 TABLET TWICE A DAY AS NEEDED FOR ANXIETY  Dispense: 30 tablet; Refill: 5  8. Moderate episode of recurrent major depressive disorder (HCC) Will start Wellbutrin today Stress management discussed - CMP14+EGFR - buPROPion (WELLBUTRIN SR) 150 MG 12 hr tablet; Take 1 tablet (150 mg total) by mouth 2 (two) times daily.  Dispense: 180 tablet; Refill: 1   Continue all meds Labs pending Health Maintenance reviewed Diet and exercise encouraged RTO 6 weeks   Evelina Dun, FNP

## 2017-05-07 NOTE — Patient Instructions (Signed)

## 2017-05-08 ENCOUNTER — Other Ambulatory Visit: Payer: Self-pay | Admitting: Family

## 2017-05-08 LAB — CMP14+EGFR
ALT: 56 IU/L — ABNORMAL HIGH (ref 0–32)
AST: 41 IU/L — AB (ref 0–40)
Albumin/Globulin Ratio: 1.5 (ref 1.2–2.2)
Albumin: 4.3 g/dL (ref 3.5–5.5)
Alkaline Phosphatase: 75 IU/L (ref 39–117)
BUN/Creatinine Ratio: 17 (ref 9–23)
BUN: 11 mg/dL (ref 6–20)
Bilirubin Total: <0.2 mg/dL (ref 0.0–1.2)
CALCIUM: 9 mg/dL (ref 8.7–10.2)
CO2: 25 mmol/L (ref 20–29)
CREATININE: 0.65 mg/dL (ref 0.57–1.00)
Chloride: 103 mmol/L (ref 96–106)
GFR, EST AFRICAN AMERICAN: 141 mL/min/{1.73_m2} (ref >59)
GFR, EST NON AFRICAN AMERICAN: 122 mL/min/{1.73_m2} (ref >59)
Globulin, Total: 2.8 g/dL (ref 1.5–4.5)
Glucose: 84 mg/dL (ref 65–99)
Potassium: 4.6 mmol/L (ref 3.5–5.2)
SODIUM: 141 mmol/L (ref 134–144)
TOTAL PROTEIN: 7.1 g/dL (ref 6.0–8.5)

## 2017-05-08 LAB — LIPID PANEL
CHOL/HDL RATIO: 3.8 ratio (ref 0.0–4.4)
Cholesterol, Total: 176 mg/dL (ref 100–199)
HDL: 46 mg/dL (ref >39)
LDL CALC: 99 mg/dL (ref 0–99)
TRIGLYCERIDES: 156 mg/dL — AB (ref 0–149)
VLDL Cholesterol Cal: 31 mg/dL (ref 5–40)

## 2017-05-08 LAB — TSH: TSH: 7.67 u[IU]/mL — AB (ref 0.450–4.500)

## 2017-05-08 MED ORDER — LEVOTHYROXINE SODIUM 150 MCG PO TABS: 150.0000 ug | ORAL_TABLET | Freq: Every day | ORAL | 1 refills | Status: DC

## 2017-07-27 ENCOUNTER — Other Ambulatory Visit: Payer: Self-pay | Admitting: Family

## 2017-08-13 ENCOUNTER — Encounter: Payer: Self-pay | Admitting: Family

## 2017-08-13 ENCOUNTER — Ambulatory Visit: Payer: BC Managed Care – PPO | Admitting: Family

## 2017-08-13 VITALS — BP 119/82 | HR 84 | Temp 98.3°F | Ht 62.0 in | Wt 292.6 lb

## 2017-08-13 DIAGNOSIS — E785 Hyperlipidemia, unspecified: Secondary | ICD-10-CM

## 2017-08-13 DIAGNOSIS — F411 Generalized anxiety disorder: Secondary | ICD-10-CM

## 2017-08-13 DIAGNOSIS — Z6841 Body Mass Index (BMI) 40.0 and over, adult: Secondary | ICD-10-CM | POA: Diagnosis not present

## 2017-08-13 DIAGNOSIS — E039 Hypothyroidism, unspecified: Secondary | ICD-10-CM

## 2017-08-13 DIAGNOSIS — I10 Essential (primary) hypertension: Secondary | ICD-10-CM

## 2017-08-13 DIAGNOSIS — R0683 Snoring: Secondary | ICD-10-CM

## 2017-08-13 DIAGNOSIS — Z0000 Encounter for general adult medical examination without abnormal findings: Secondary | ICD-10-CM | POA: Diagnosis not present

## 2017-08-13 DIAGNOSIS — F331 Major depressive disorder, recurrent, moderate: Secondary | ICD-10-CM

## 2017-08-13 DIAGNOSIS — Z23 Encounter for immunization: Secondary | ICD-10-CM | POA: Diagnosis not present

## 2017-08-13 DIAGNOSIS — K2100 Gastro-esophageal reflux disease with esophagitis, without bleeding: Secondary | ICD-10-CM

## 2017-08-13 DIAGNOSIS — Z713 Dietary counseling and surveillance: Secondary | ICD-10-CM

## 2017-08-13 DIAGNOSIS — E8881 Metabolic syndrome: Secondary | ICD-10-CM

## 2017-08-13 MED ORDER — ALPRAZOLAM 0.5 MG PO TABS: ORAL_TABLET | ORAL | 5 refills | Status: DC

## 2017-08-13 MED ORDER — LEVOTHYROXINE SODIUM 150 MCG PO TABS: 150.0000 ug | ORAL_TABLET | Freq: Every day | ORAL | 1 refills | Status: DC

## 2017-08-13 MED ORDER — BUPROPION HCL ER (SR) 150 MG PO TB12: 150.0000 mg | ORAL_TABLET | Freq: Two times a day (BID) | ORAL | 1 refills | Status: DC

## 2017-08-13 MED ORDER — HYDROCHLOROTHIAZIDE 25 MG PO TABS: 25.0000 mg | ORAL_TABLET | Freq: Every day | ORAL | 3 refills | Status: DC

## 2017-08-13 MED ORDER — PHENTERMINE HCL 37.5 MG PO TABS: 37.5000 mg | ORAL_TABLET | Freq: Every day | ORAL | 2 refills | Status: DC

## 2017-08-13 NOTE — Patient Instructions (Signed)
Exercising to Lose Weight Exercising can help you to lose weight. In order to lose weight through exercise, you need to do vigorous-intensity exercise. You can tell that you are exercising with vigorous intensity if you are breathing very hard and fast and cannot hold a conversation while exercising. Moderate-intensity exercise helps to maintain your current weight. You can tell that you are exercising at a moderate level if you have a higher heart rate and faster breathing, but you are still able to hold a conversation. How often should I exercise? Choose an activity that you enjoy and set realistic goals. Your health care provider can help you to make an activity plan that works for you. Exercise regularly as directed by your health care provider. This may include:  Doing resistance training twice each week, such as: ? Push-ups. ? Sit-ups. ? Lifting weights. ? Using resistance bands.  Doing a given intensity of exercise for a given amount of time. Choose from these options: ? 150 minutes of moderate-intensity exercise every week. ? 75 minutes of vigorous-intensity exercise every week. ? A mix of moderate-intensity and vigorous-intensity exercise every week.  Children, pregnant women, people who are out of shape, people who are overweight, and older adults may need to consult a health care provider for individual recommendations. If you have any sort of medical condition, be sure to consult your health care provider before starting a new exercise program. What are some activities that can help me to lose weight?  Walking at a rate of at least 4.5 miles an hour.  Jogging or running at a rate of 5 miles per hour.  Biking at a rate of at least 10 miles per hour.  Lap swimming.  Roller-skating or in-line skating.  Cross-country skiing.  Vigorous competitive sports, such as football, basketball, and soccer.  Jumping rope.  Aerobic dancing. How can I be more active in my day-to-day  activities?  Use the stairs instead of the elevator.  Take a walk during your lunch break.  If you drive, park your car farther away from work or school.  If you take public transportation, get off one stop early and walk the rest of the way.  Make all of your phone calls while standing up and walking around.  Get up, stretch, and walk around every 30 minutes throughout the day. What guidelines should I follow while exercising?  Do not exercise so much that you hurt yourself, feel dizzy, or get very short of breath.  Consult your health care provider prior to starting a new exercise program.  Wear comfortable clothes and shoes with good support.  Drink plenty of water while you exercise to prevent dehydration or heat stroke. Body water is lost during exercise and must be replaced.  Work out until you breathe faster and your heart beats faster. This information is not intended to replace advice given to you by your health care provider. Make sure you discuss any questions you have with your health care provider. Document Released: 02/11/2010 Document Revised: 06/17/2015 Document Reviewed: 06/12/2013 Elsevier Interactive Patient Education  2018 Elsevier Inc.  

## 2017-08-13 NOTE — Addendum Note (Signed)
Addended by: Shelbie Ammons on: 08/13/2017 09:37 AM   Modules accepted: Orders

## 2017-08-13 NOTE — Progress Notes (Signed)
Subjective:    Patient ID: Diana Avery, female    DOB: 1990-04-08, 27 y.o.   MRN: [id number]  Chief Complaint  Patient presents with  . thyroid level recheck    lab work due  . GAD    two month recheck   Pt presents to the office today for CPE without pap. She is wants to discuss weight loss.  States she has been trying at home and has lost about 6 pounds since her last visit.   She is also complaining of increase snoring and day time sleepiness.  Anxiety  Presents for follow-up visit. Symptoms include depressed mood, excessive worry, irritability, nervous/anxious behavior and restlessness. Patient reports no shortness of breath. Symptoms occur occasionally. The severity of symptoms is moderate. The quality of sleep is good.    Depression         This is a chronic problem.  The current episode started more than 1 year ago.   The onset quality is gradual.   The problem occurs intermittently.  The problem has been waxing and waning since onset.  Associated symptoms include restlessness and decreased interest.  Associated symptoms include no fatigue, no helplessness, no hopelessness, no headaches and not sad.  Past treatments include SSRIs - Selective serotonin reuptake inhibitors.  Compliance with treatment is variable and good.  Past medical history includes thyroid problem and anxiety.   Gastroesophageal Reflux  She reports no belching, no coughing, no heartburn or no hoarse voice. This is a chronic problem. The current episode started more than 1 year ago. The problem occurs occasionally. The problem has been waxing and waning. Pertinent negatives include no fatigue. She has tried a diet change and an antacid for the symptoms. The treatment provided moderate relief.  Hypertension  This is a chronic problem. The current episode started more than 1 year ago. The problem has been resolved since onset. The problem is controlled. Associated symptoms include anxiety. Pertinent negatives  include no headaches, neck pain, peripheral edema or shortness of breath. Risk factors for coronary artery disease include obesity. Identifiable causes of hypertension include a thyroid problem.  Thyroid Problem  Presents for follow-up visit. Symptoms include anxiety and depressed mood. Patient reports no constipation, fatigue or hoarse voice. The symptoms have been stable.      Review of Systems  Constitutional: Positive for irritability. Negative for fatigue.  HENT: Negative for hoarse voice.   Respiratory: Negative for cough and shortness of breath.   Gastrointestinal: Negative for constipation and heartburn.  Musculoskeletal: Negative for neck pain.  Neurological: Negative for headaches.  Psychiatric/Behavioral: Positive for depression. The patient is nervous/anxious.   All other systems reviewed and are negative.      Objective:   Physical Exam  Constitutional: She is oriented to person, place, and time. She appears well-developed and well-nourished. No distress.  Morbid obese   HENT:  Head: Normocephalic and atraumatic.  Right Ear: External ear normal.  Left Ear: External ear normal.  Mouth/Throat: Oropharynx is clear and moist.  Eyes: Pupils are equal, round, and reactive to light.  Neck: Normal range of motion. Neck supple. No thyromegaly present.  Cardiovascular: Normal rate, regular rhythm, normal heart sounds and intact distal pulses.  No murmur heard. Pulmonary/Chest: Effort normal and breath sounds normal. No respiratory distress. She has no wheezes.  Abdominal: Soft. Bowel sounds are normal. She exhibits no distension. There is no tenderness.  Musculoskeletal: Normal range of motion. She exhibits no edema or tenderness.  Neurological: She is  alert and oriented to person, place, and time. She has normal reflexes. No cranial nerve deficit.  Skin: Skin is warm and dry.  Psychiatric: She has a normal mood and affect. Her behavior is normal. Judgment and thought content  normal.  Vitals reviewed.     BP 119/82   Pulse 84   Temp 98.3 F (36.8 C) (Oral)   Ht _0  (1.575 m)   Wt 292 lb 9.6 oz (132.7 kg)   BMI 53.52 kg/m      Assessment & Plan:  Diana Avery comes in today with chief complaint of thyroid level recheck (lab work due) and GAD (two month recheck)   Diagnosis and orders addressed:  1. Moderate episode of recurrent major depressive disorder (HCC) - CMP14+EGFR - CBC with Differential/Platelet - buPROPion (WELLBUTRIN SR) 150 MG 12 hr tablet; Take 1 tablet (150 mg total) by mouth 2 (two) times daily.  Dispense: 180 tablet; Refill: 1  2. Essential hypertension - CMP14+EGFR - CBC with Differential/Platelet - hydrochlorothiazide (HYDRODIURIL) 25 MG tablet; Take 1 tablet (25 mg total) by mouth daily.  Dispense: 90 tablet; Refill: 3  3. GAD (generalized anxiety disorder) - CMP14+EGFR - CBC with Differential/Platelet - buPROPion (WELLBUTRIN SR) 150 MG 12 hr tablet; Take 1 tablet (150 mg total) by mouth 2 (two) times daily.  Dispense: 180 tablet; Refill: 1 - ALPRAZolam (XANAX) 0.5 MG tablet; TAKE 1 TABLET TWICE A DAY AS NEEDED FOR ANXIETY  Dispense: 30 tablet; Refill: 5  4. Gastroesophageal reflux disease with esophagitis - CMP14+EGFR - CBC with Differential/Platelet  5. Hyperlipidemia, unspecified hyperlipidemia type - CMP14+EGFR - CBC with Differential/Platelet - Lipid panel  6. Hypothyroidism, unspecified type - CMP14+EGFR - CBC with Differential/Platelet - TSH - levothyroxine (SYNTHROID, LEVOTHROID) 150 MCG tablet; Take 1 tablet (150 mcg total) by mouth daily before breakfast.  Dispense: 90 tablet; Refill: 1  7. Metabolic syndrome - OIB70+WUGQ - CBC with Differential/Platelet  8. Morbid obesity with BMI of 50.0-59.9, adult (HCC) - CMP14+EGFR - CBC with Differential/Platelet - phentermine (ADIPEX-P) 37.5 MG tablet; Take 1 tablet (37.5 mg total) by mouth daily before breakfast.  Dispense: 30 tablet; Refill: 2 -  Ambulatory referral to Pulmonology  9. Weight loss counseling, encounter for Will start phentermine today Encourage exercise and healthy diet Must lose 5% of weight loss to continue medication - CMP14+EGFR - CBC with Differential/Platelet - phentermine (ADIPEX-P) 37.5 MG tablet; Take 1 tablet (37.5 mg total) by mouth daily before breakfast.  Dispense: 30 tablet; Refill: 2  10. Snoring - CMP14+EGFR - CBC with Differential/Platelet - Ambulatory referral to Pulmonology   Labs pending Health Maintenance reviewed- TDAP given today Diet and exercise encouraged  Follow up plan: 3 months   Evelina Dun, FNP

## 2017-08-14 ENCOUNTER — Other Ambulatory Visit: Payer: Self-pay | Admitting: Family

## 2017-08-14 LAB — CMP14+EGFR
ALBUMIN: 4.6 g/dL (ref 3.5–5.5)
ALK PHOS: 82 IU/L (ref 39–117)
ALT: 61 IU/L — ABNORMAL HIGH (ref 0–32)
AST: 42 IU/L — AB (ref 0–40)
Albumin/Globulin Ratio: 1.5 (ref 1.2–2.2)
BILIRUBIN TOTAL: 0.2 mg/dL (ref 0.0–1.2)
BUN / CREAT RATIO: 12 (ref 9–23)
BUN: 10 mg/dL (ref 6–20)
CHLORIDE: 98 mmol/L (ref 96–106)
CO2: 24 mmol/L (ref 20–29)
Calcium: 9.2 mg/dL (ref 8.7–10.2)
Creatinine, Ser: 0.81 mg/dL (ref 0.57–1.00)
GFR calc Af Amer: 114 mL/min/{1.73_m2} (ref >59)
GFR calc non Af Amer: 99 mL/min/{1.73_m2} (ref >59)
GLOBULIN, TOTAL: 3 g/dL (ref 1.5–4.5)
Glucose: 95 mg/dL (ref 65–99)
POTASSIUM: 4.2 mmol/L (ref 3.5–5.2)
SODIUM: 139 mmol/L (ref 134–144)
Total Protein: 7.6 g/dL (ref 6.0–8.5)

## 2017-08-14 LAB — CBC WITH DIFFERENTIAL/PLATELET
BASOS ABS: 0 10*3/uL (ref 0.0–0.2)
Basos: 0 %
EOS (ABSOLUTE): 0.3 10*3/uL (ref 0.0–0.4)
Eos: 2 %
HEMATOCRIT: 37.5 % (ref 34.0–46.6)
Hemoglobin: 12.4 g/dL (ref 11.1–15.9)
IMMATURE GRANULOCYTES: 0 %
Immature Grans (Abs): 0 10*3/uL (ref 0.0–0.1)
LYMPHS ABS: 2.5 10*3/uL (ref 0.7–3.1)
Lymphs: 23 %
MCH: 26.2 pg — ABNORMAL LOW (ref 26.6–33.0)
MCHC: 33.1 g/dL (ref 31.5–35.7)
MCV: 79 fL (ref 79–97)
MONOS ABS: 0.7 10*3/uL (ref 0.1–0.9)
Monocytes: 7 %
NEUTROS PCT: 68 %
Neutrophils Absolute: 7.1 10*3/uL — ABNORMAL HIGH (ref 1.4–7.0)
PLATELETS: 448 10*3/uL (ref 150–450)
RBC: 4.74 x10E6/uL (ref 3.77–5.28)
RDW: 15.4 % (ref 12.3–15.4)
WBC: 10.6 10*3/uL (ref 3.4–10.8)

## 2017-08-14 LAB — TSH: TSH: 4.57 u[IU]/mL — AB (ref 0.450–4.500)

## 2017-08-14 LAB — LIPID PANEL
CHOL/HDL RATIO: 4.4 ratio (ref 0.0–4.4)
Cholesterol, Total: 181 mg/dL (ref 100–199)
HDL: 41 mg/dL (ref >39)
LDL Calculated: 120 mg/dL — ABNORMAL HIGH (ref 0–99)
TRIGLYCERIDES: 98 mg/dL (ref 0–149)
VLDL Cholesterol Cal: 20 mg/dL (ref 5–40)

## 2017-08-14 MED ORDER — LEVOTHYROXINE SODIUM 175 MCG PO TABS: 175.0000 ug | ORAL_TABLET | Freq: Every day | ORAL | 1 refills | Status: DC

## 2017-08-17 ENCOUNTER — Encounter: Payer: BC Managed Care – PPO | Admitting: Gynecology

## 2017-08-20 ENCOUNTER — Encounter: Payer: Self-pay | Admitting: Gynecology

## 2017-08-20 ENCOUNTER — Ambulatory Visit: Payer: BC Managed Care – PPO | Admitting: Gynecology

## 2017-08-20 VITALS — BP 126/80 | Ht 62.0 in | Wt 283.0 lb

## 2017-08-20 DIAGNOSIS — Z01419 Encounter for gynecological examination (general) (routine) without abnormal findings: Secondary | ICD-10-CM | POA: Diagnosis not present

## 2017-08-20 MED ORDER — NORETHINDRONE ACET-ETHINYL EST 1-20 MG-MCG PO TABS: 1.0000 | ORAL_TABLET | Freq: Every day | ORAL | 4 refills | Status: DC

## 2017-08-20 NOTE — Progress Notes (Signed)
[id number]

## 2017-08-20 NOTE — Patient Instructions (Signed)
Follow-up in 1 year for annual exam, sooner if any issues. 

## 2017-08-20 NOTE — Progress Notes (Signed)
    Earl Zellmer 07/21/1990 [id number]        27 y.o.  G3P0020 for annual gynecologic exam.  Also wanted to talk about starting on birth control pills.  Had been followed with irregular menses and pregnancy trial.  Now wants to postpone pregnancy trial as she undergoes weight loss and then will try following this.  Past medical history,surgical history, problem list, medications, allergies, family history and social history were all reviewed and documented as reviewed in the EPIC chart.  ROS:  Performed with pertinent positives and negatives included in the history, assessment and plan.   Additional significant findings : None   Exam: Wandra Scot assistant Vitals:   08/20/17 1556  BP: 126/80  Weight: 283 lb (128.4 kg)  Height: 5\' 2"  (1.575 m)   Body mass index is 51.76 kg/m.  General appearance:  Normal affect, orientation and appearance. Skin: Grossly normal HEENT: Without gross lesions.  No cervical or supraclavicular adenopathy. Thyroid normal.  Lungs:  Clear without wheezing, rales or rhonchi Cardiac: RR, without RMG Abdominal:  Soft, nontender, without masses, guarding, rebound, organomegaly or hernia Breasts:  Examined lying and sitting without masses, retractions, discharge or axillary adenopathy. Pelvic:  Ext, BUS, Vagina: Normal  Cervix: Normal  Uterus: Difficult to palpate but no gross masses or tenderness.  Adnexa: Without masses or tenderness    Anus and perineum: Normal   Rectovaginal: Normal sphincter tone without palpated masses or tenderness.    Assessment/Plan:  27 y.o. G66P0020 female for annual gynecologic exam.   1. Contraception.  Patient has used birth control pills in the past and wants to go ahead and start them again to postpone pregnancy trial for now.  Loestrin 1/20 equivalent provided with refill x1 year. 2. Pap smear 2016.  Pap smear done today.  No history of significant abnormal Pap smears.  Continue with every 3-year Pap smears per  current screening guidelines. 3. Breast health.  Breast exam normal today. 4. Health maintenance.  No routine lab work done as patient does this elsewhere.  Currently being followed for weight loss on phentermine.  Follow-up in 1 year for annual gynecologic exam.  Sooner if any issues.   Anastasio Auerbach MD, 4:43 PM 08/20/2017

## 2017-08-21 LAB — PAP IG W/ RFLX HPV ASCU

## 2017-08-21 NOTE — Telephone Encounter (Signed)
I am not sure a patch is a better choice.  It contains the same hormones as the pills but at a slightly higher dose.  Main advantage of the patch is in patients who forget to take the pills.  If patient still wants to go ahead with the patch then OrthoEvra equivalent okay.

## 2017-08-27 ENCOUNTER — Encounter: Payer: Self-pay | Admitting: Gynecology

## 2017-08-28 ENCOUNTER — Other Ambulatory Visit: Payer: Self-pay | Admitting: Gynecology

## 2017-08-28 MED ORDER — METRONIDAZOLE 500 MG PO TABS: 500.0000 mg | ORAL_TABLET | Freq: Two times a day (BID) | ORAL | 0 refills | Status: DC

## 2017-08-28 NOTE — Telephone Encounter (Signed)
With those symptoms then I think treatment would be okay with Flagyl 500 mg twice daily x7 days.  No alcohol while taking.

## 2017-08-28 NOTE — Telephone Encounter (Signed)
Her Pap showed: " INFECTION:   Comment: Shift in vaginal flora suggestive of bacterial  vaginosis.    I think that is what she is referring to.

## 2017-09-29 IMAGING — US US ABDOMEN LIMITED
1 series · 14 of 25 positions shown · non-contrast
Comparison: 12/23/2012

CLINICAL DATA: Abdominal pain for 2 weeks. Elevated liver function
tests for 3 years.

EXAM:
US ABDOMEN LIMITED - RIGHT UPPER QUADRANT

[Series 1: us abdomen limited · 0.27mm/px · 14 of 56 slices shown]
[im 1/56]
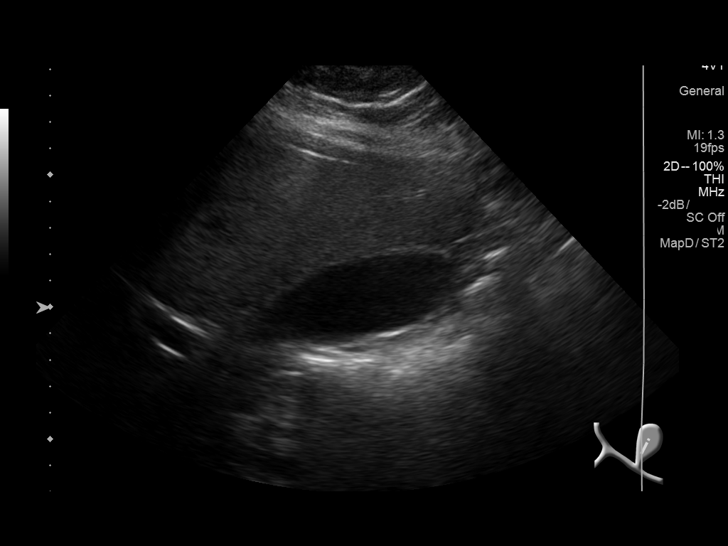
[im 5/56]
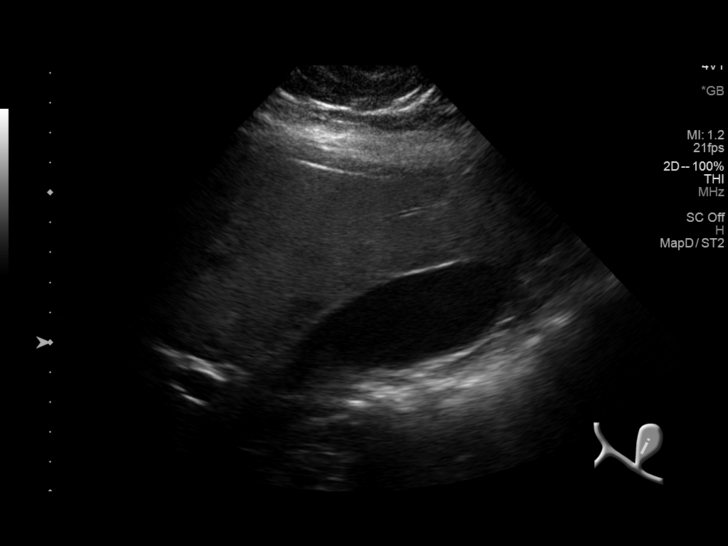
[im 10/56]
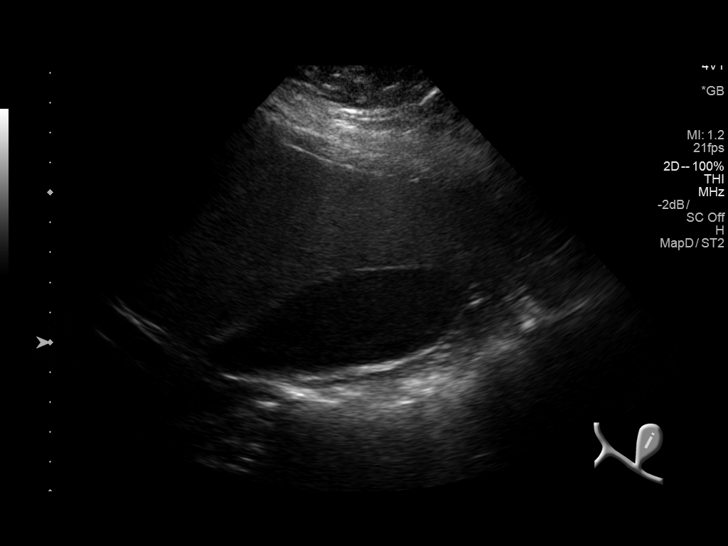
[im 14/56]
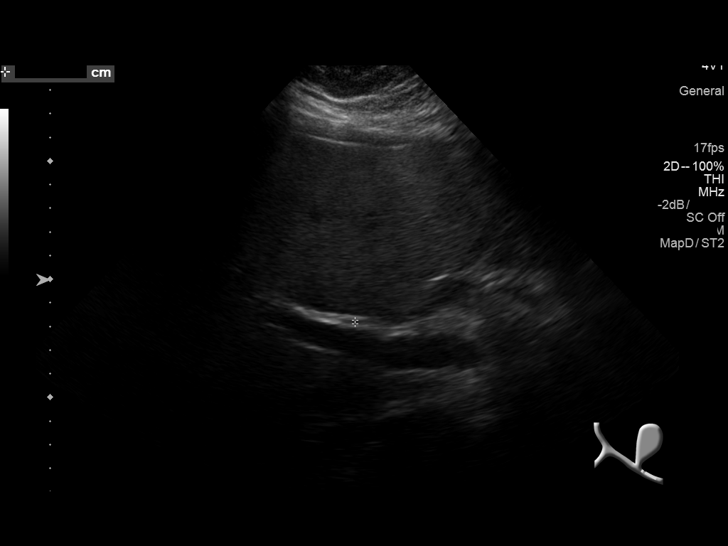
[im 19/56]
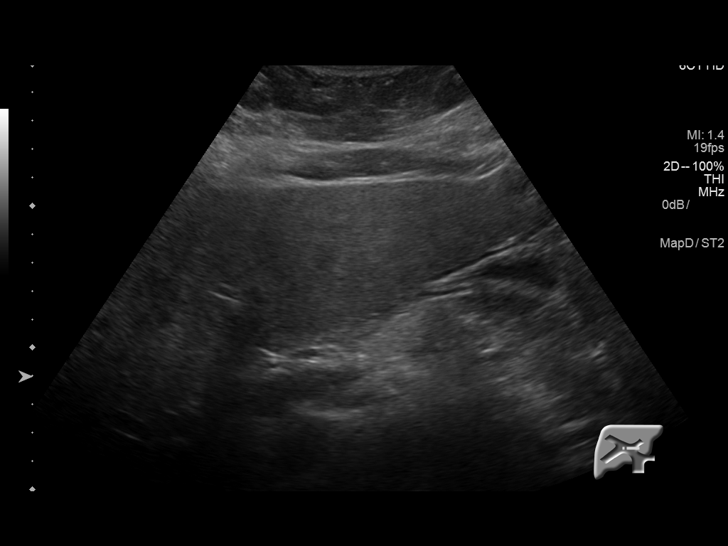
[im 21/56]
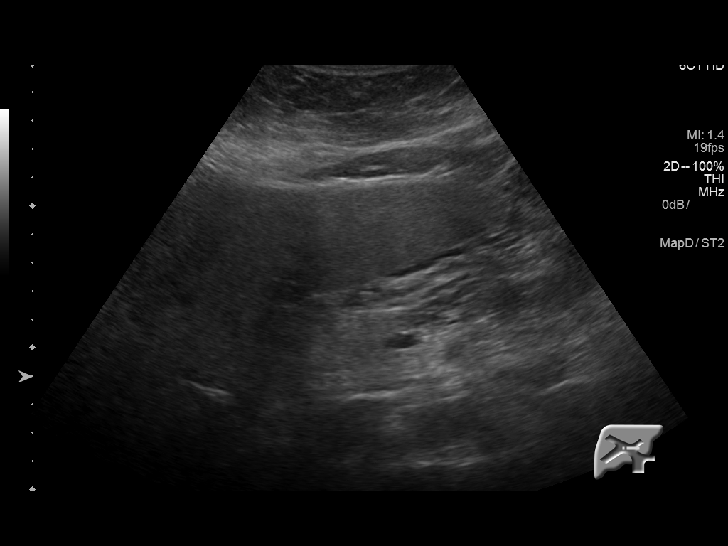
[im 26/56]
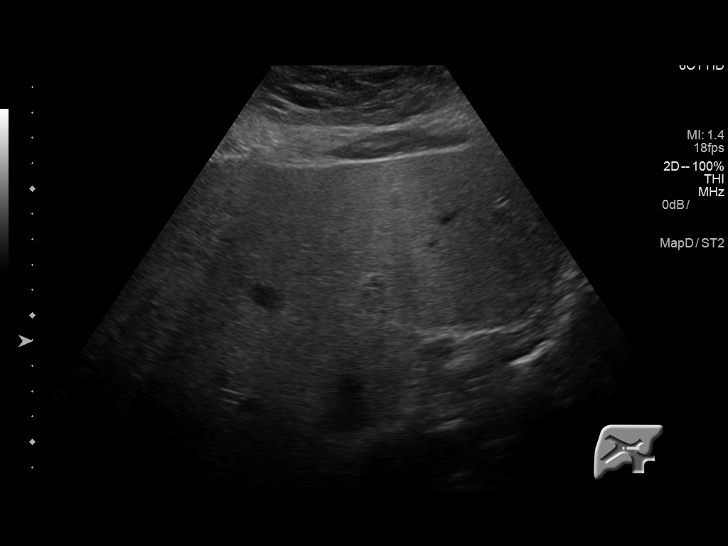
[im 30/56]
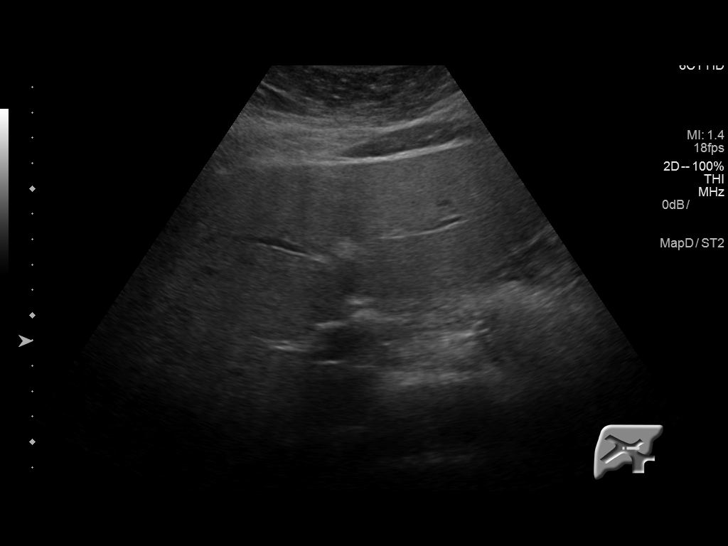
[im 35/56]
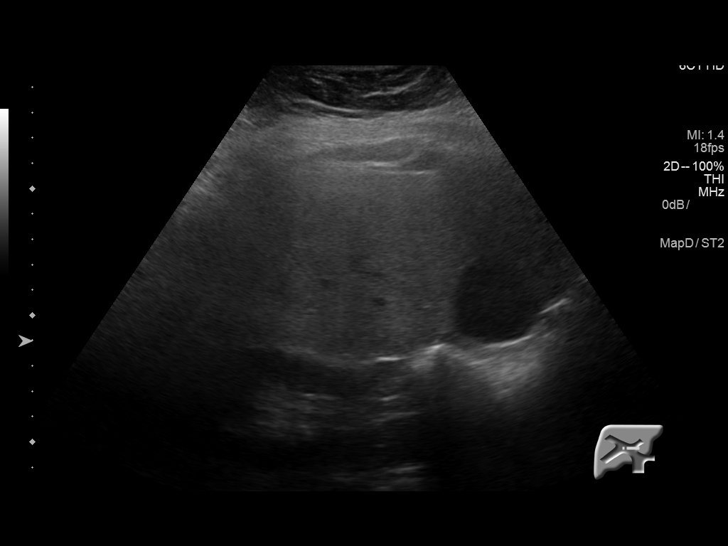
[im 37/56]
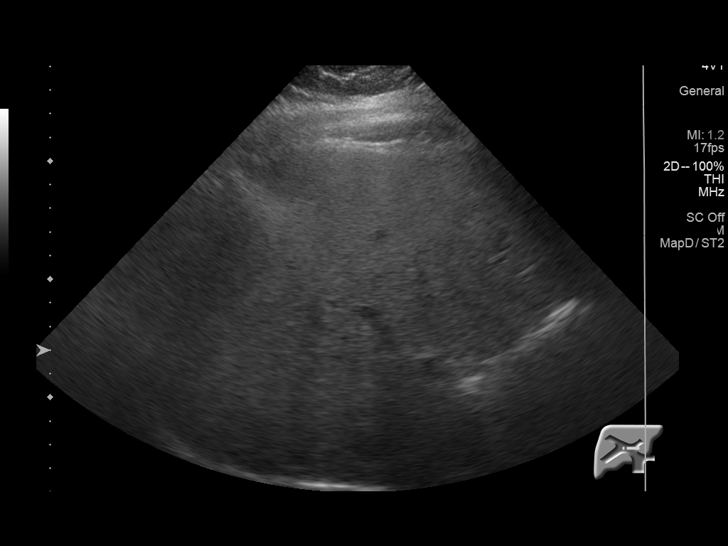
[im 42/56]
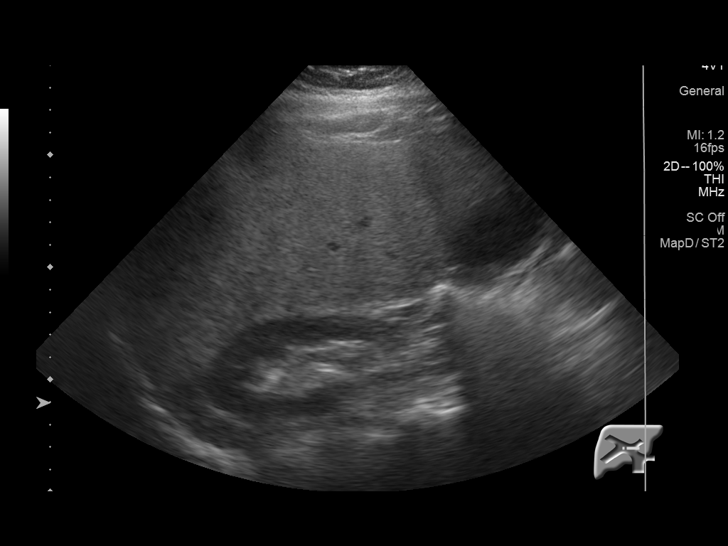
[im 46/56]
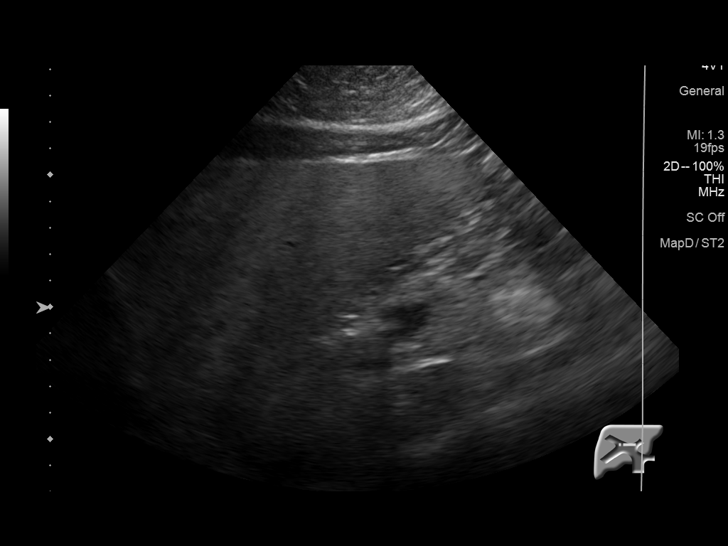
[im 51/56]
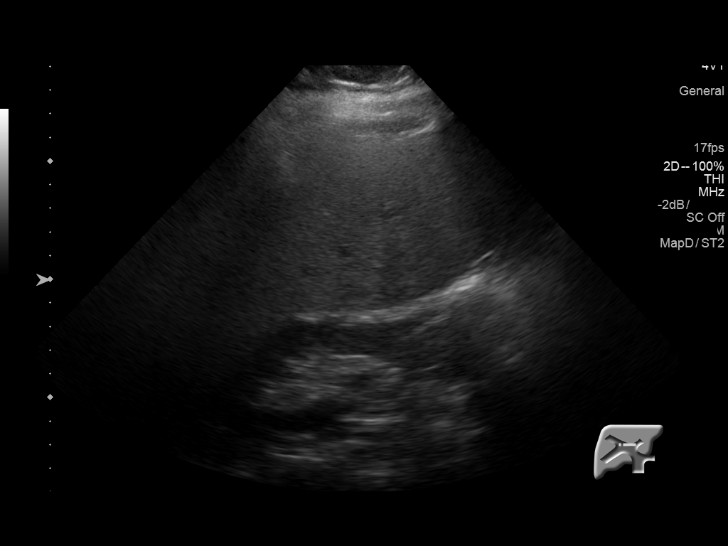
[im 56/56]
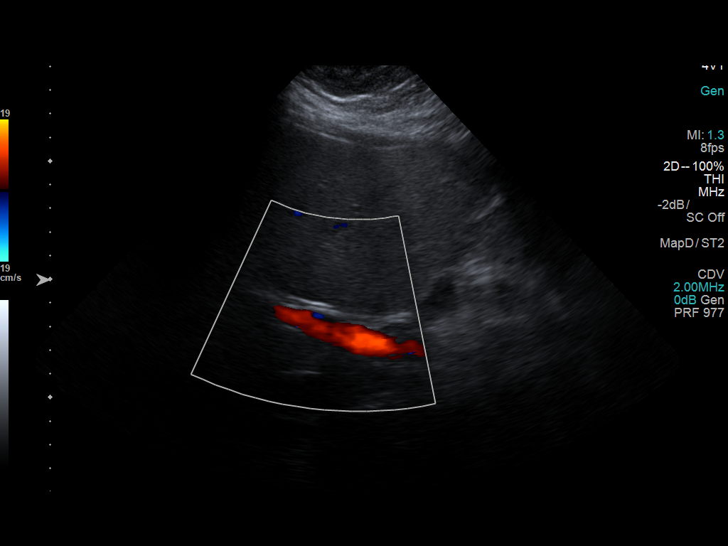

[14 of 25 positions shown; findings below may reference images not displayed]

FINDINGS: Gallbladder:

No gallstones or wall thickening visualized. No sonographic Murphy
sign noted.

Common bile duct:

Diameter: 3 mm

Liver:

Liver is diffusely increased in echogenicity without focal mass.
IMPRESSION: Stable diffuse hepatic steatosis

Normal gallbladder and biliary tree.

## 2017-10-08 ENCOUNTER — Institutional Professional Consult (permissible substitution): Payer: BC Managed Care – PPO | Admitting: Neurology

## 2017-11-12 ENCOUNTER — Ambulatory Visit: Payer: BC Managed Care – PPO | Admitting: Family

## 2017-11-12 ENCOUNTER — Encounter: Payer: Self-pay | Admitting: Family

## 2017-11-12 VITALS — BP 113/78 | HR 91 | Temp 97.1°F | Ht 62.0 in | Wt 249.4 lb

## 2017-11-12 DIAGNOSIS — Z6841 Body Mass Index (BMI) 40.0 and over, adult: Secondary | ICD-10-CM | POA: Diagnosis not present

## 2017-11-12 DIAGNOSIS — E039 Hypothyroidism, unspecified: Secondary | ICD-10-CM | POA: Diagnosis not present

## 2017-11-12 DIAGNOSIS — Z713 Dietary counseling and surveillance: Secondary | ICD-10-CM | POA: Diagnosis not present

## 2017-11-12 MED ORDER — PHENTERMINE HCL 37.5 MG PO TABS: 37.5000 mg | ORAL_TABLET | Freq: Every day | ORAL | 2 refills | Status: DC

## 2017-11-12 NOTE — Patient Instructions (Signed)
Exercising to Lose Weight Exercising can help you to lose weight. In order to lose weight through exercise, you need to do vigorous-intensity exercise. You can tell that you are exercising with vigorous intensity if you are breathing very hard and fast and cannot hold a conversation while exercising. Moderate-intensity exercise helps to maintain your current weight. You can tell that you are exercising at a moderate level if you have a higher heart rate and faster breathing, but you are still able to hold a conversation. How often should I exercise? Choose an activity that you enjoy and set realistic goals. Your health care provider can help you to make an activity plan that works for you. Exercise regularly as directed by your health care provider. This may include:  Doing resistance training twice each week, such as: ? Push-ups. ? Sit-ups. ? Lifting weights. ? Using resistance bands.  Doing a given intensity of exercise for a given amount of time. Choose from these options: ? 150 minutes of moderate-intensity exercise every week. ? 75 minutes of vigorous-intensity exercise every week. ? A mix of moderate-intensity and vigorous-intensity exercise every week.  Children, pregnant women, people who are out of shape, people who are overweight, and older adults may need to consult a health care provider for individual recommendations. If you have any sort of medical condition, be sure to consult your health care provider before starting a new exercise program. What are some activities that can help me to lose weight?  Walking at a rate of at least 4.5 miles an hour.  Jogging or running at a rate of 5 miles per hour.  Biking at a rate of at least 10 miles per hour.  Lap swimming.  Roller-skating or in-line skating.  Cross-country skiing.  Vigorous competitive sports, such as football, basketball, and soccer.  Jumping rope.  Aerobic dancing. How can I be more active in my day-to-day  activities?  Use the stairs instead of the elevator.  Take a walk during your lunch break.  If you drive, park your car farther away from work or school.  If you take public transportation, get off one stop early and walk the rest of the way.  Make all of your phone calls while standing up and walking around.  Get up, stretch, and walk around every 30 minutes throughout the day. What guidelines should I follow while exercising?  Do not exercise so much that you hurt yourself, feel dizzy, or get very short of breath.  Consult your health care provider prior to starting a new exercise program.  Wear comfortable clothes and shoes with good support.  Drink plenty of water while you exercise to prevent dehydration or heat stroke. Body water is lost during exercise and must be replaced.  Work out until you breathe faster and your heart beats faster. This information is not intended to replace advice given to you by your health care provider. Make sure you discuss any questions you have with your health care provider. Document Released: 02/11/2010 Document Revised: 06/17/2015 Document Reviewed: 06/12/2013 Elsevier Interactive Patient Education  2018 Elsevier Inc.  

## 2017-11-12 NOTE — Progress Notes (Signed)
   Subjective:    Patient ID: Diana Avery, female    DOB: [birth year], 27 y.o.   MRN: [id number]  Chief Complaint  Patient presents with  . Medical Management of Chronic Issues    three month recheck   Pt presents to the office today to recheck weight loss from starting phentermine. She states she has lost 50 lbs. She is doing great!! She feels like the phentermine as helped her with binge eating.  She also needs to recheck thyroid. Her TSH was elevated on 08/13/17 and we increased her levothyroxine to 175 mcg from 150 mcg. States she feels like she is doing.  Thyroid Problem  Presents for follow-up visit. Patient reports no constipation, depressed mood, fatigue, hoarse voice or visual change. The symptoms have been stable.      Review of Systems  Constitutional: Negative for fatigue.  HENT: Negative for hoarse voice.   Gastrointestinal: Negative for constipation.  All other systems reviewed and are negative.      Objective:   Physical Exam  Constitutional: She is oriented to person, place, and time. She appears well-developed and well-nourished. No distress.  HENT:  Head: Normocephalic.  Eyes: Pupils are equal, round, and reactive to light.  Neck: Normal range of motion. Neck supple. No thyromegaly present.  Cardiovascular: Normal rate, regular rhythm, normal heart sounds and intact distal pulses.  No murmur heard. Pulmonary/Chest: Effort normal and breath sounds normal. No respiratory distress. She has no wheezes.  Abdominal: Soft. Bowel sounds are normal. She exhibits no distension. There is no tenderness.  Musculoskeletal: Normal range of motion. She exhibits no edema or tenderness.  Neurological: She is alert and oriented to person, place, and time. She has normal reflexes. No cranial nerve deficit.  Skin: Skin is warm and dry.  Psychiatric: She has a normal mood and affect. Her behavior is normal. Judgment and thought content normal.  Vitals reviewed.     BP  113/78   Pulse 91   Temp (!) 97.1 F (36.2 C) (Oral)   Ht _0  (1.575 m)   Wt 249 lb 6.4 oz (113.1 kg)   BMI 45.62 kg/m      Assessment & Plan:  Yaritzi Craun comes in today with chief complaint of Medical Management of Chronic Issues (three month recheck)   Diagnosis and orders addressed:  1. Morbid obesity with BMI of 45.0-49.9, adult ([place]) We will continue Phentermine Keep up the great work!!!! [employer] and exercise encouraged - phentermine (ADIPEX-P) 37.5 MG tablet; Take 1 tablet (37.5 mg total) by mouth daily before breakfast.  Dispense: 30 tablet; Refill: 2 - CMP14+EGFR  2. Weight loss counseling, encounter for - phentermine (ADIPEX-P) 37.5 MG tablet; Take 1 tablet (37.5 mg total) by mouth daily before breakfast.  Dispense: 30 tablet; Refill: 2 - CMP14+EGFR  3. Hypothyroidism, unspecified type Labs pending  - CMP14+EGFR - TSH   Labs pending Health Maintenance reviewed Diet and exercise encouraged  Follow up plan: 3 months   Evelina Dun, FNP

## 2017-11-13 ENCOUNTER — Other Ambulatory Visit: Payer: Self-pay | Admitting: Family

## 2017-11-13 LAB — CMP14+EGFR
ALK PHOS: 78 IU/L (ref 39–117)
ALT: 23 IU/L (ref 0–32)
AST: 15 IU/L (ref 0–40)
Albumin/Globulin Ratio: 1.4 (ref 1.2–2.2)
Albumin: 4 g/dL (ref 3.5–5.5)
BUN/Creatinine Ratio: 15 (ref 9–23)
BUN: 9 mg/dL (ref 6–20)
Bilirubin Total: <0.2 mg/dL (ref 0.0–1.2)
CHLORIDE: 103 mmol/L (ref 96–106)
CO2: 22 mmol/L (ref 20–29)
CREATININE: 0.62 mg/dL (ref 0.57–1.00)
Calcium: 9.2 mg/dL (ref 8.7–10.2)
GFR calc Af Amer: 142 mL/min/{1.73_m2} (ref >59)
GFR calc non Af Amer: 123 mL/min/{1.73_m2} (ref >59)
GLOBULIN, TOTAL: 2.8 g/dL (ref 1.5–4.5)
GLUCOSE: 76 mg/dL (ref 65–99)
Potassium: 4.2 mmol/L (ref 3.5–5.2)
Sodium: 140 mmol/L (ref 134–144)
Total Protein: 6.8 g/dL (ref 6.0–8.5)

## 2017-11-13 LAB — TSH: TSH: 0.123 u[IU]/mL — AB (ref 0.450–4.500)

## 2017-11-13 MED ORDER — LEVOTHYROXINE SODIUM 150 MCG PO TABS: 150.0000 ug | ORAL_TABLET | Freq: Every day | ORAL | 2 refills | Status: DC

## 2017-11-15 ENCOUNTER — Ambulatory Visit: Payer: BC Managed Care – PPO | Admitting: Family

## 2017-11-23 ENCOUNTER — Encounter: Payer: Self-pay | Admitting: Gynecology

## 2018-02-13 ENCOUNTER — Other Ambulatory Visit: Payer: Self-pay | Admitting: Family

## 2018-02-13 DIAGNOSIS — Z713 Dietary counseling and surveillance: Secondary | ICD-10-CM

## 2018-02-13 DIAGNOSIS — F411 Generalized anxiety disorder: Secondary | ICD-10-CM

## 2018-02-13 DIAGNOSIS — Z6841 Body Mass Index (BMI) 40.0 and over, adult: Principal | ICD-10-CM

## 2018-05-03 ENCOUNTER — Other Ambulatory Visit: Payer: Self-pay | Admitting: Gynecology

## 2018-05-10 ENCOUNTER — Other Ambulatory Visit: Payer: Self-pay | Admitting: Family

## 2018-05-10 DIAGNOSIS — F331 Major depressive disorder, recurrent, moderate: Secondary | ICD-10-CM

## 2018-05-10 DIAGNOSIS — F411 Generalized anxiety disorder: Secondary | ICD-10-CM

## 2018-06-10 ENCOUNTER — Other Ambulatory Visit: Payer: Self-pay

## 2018-06-11 ENCOUNTER — Encounter: Payer: Self-pay | Admitting: Family

## 2018-06-11 ENCOUNTER — Other Ambulatory Visit: Payer: Self-pay

## 2018-06-11 ENCOUNTER — Ambulatory Visit: Payer: BC Managed Care – PPO | Admitting: Family

## 2018-06-11 VITALS — BP 117/83 | HR 98 | Temp 98.6°F | Ht 62.0 in | Wt 237.8 lb

## 2018-06-11 DIAGNOSIS — K2100 Gastro-esophageal reflux disease with esophagitis, without bleeding: Secondary | ICD-10-CM

## 2018-06-11 DIAGNOSIS — I10 Essential (primary) hypertension: Secondary | ICD-10-CM

## 2018-06-11 DIAGNOSIS — E785 Hyperlipidemia, unspecified: Secondary | ICD-10-CM

## 2018-06-11 DIAGNOSIS — Z6841 Body Mass Index (BMI) 40.0 and over, adult: Secondary | ICD-10-CM

## 2018-06-11 DIAGNOSIS — E039 Hypothyroidism, unspecified: Secondary | ICD-10-CM

## 2018-06-11 DIAGNOSIS — F331 Major depressive disorder, recurrent, moderate: Secondary | ICD-10-CM | POA: Diagnosis not present

## 2018-06-11 DIAGNOSIS — G43011 Migraine without aura, intractable, with status migrainosus: Secondary | ICD-10-CM

## 2018-06-11 DIAGNOSIS — Z79899 Other long term (current) drug therapy: Secondary | ICD-10-CM

## 2018-06-11 DIAGNOSIS — F411 Generalized anxiety disorder: Secondary | ICD-10-CM

## 2018-06-11 DIAGNOSIS — E8881 Metabolic syndrome: Secondary | ICD-10-CM

## 2018-06-11 DIAGNOSIS — F1320 Sedative, hypnotic or anxiolytic dependence, uncomplicated: Secondary | ICD-10-CM

## 2018-06-11 MED ORDER — SEMAGLUTIDE(0.25 OR 0.5MG/DOS) 2 MG/1.5ML ~~LOC~~ SOPN: PEN_INJECTOR | SUBCUTANEOUS | 1 refills | Status: AC

## 2018-06-11 NOTE — Patient Instructions (Signed)

## 2018-06-11 NOTE — Progress Notes (Signed)
Subjective:    Patient ID: Alen Bleacher, female    DOB: 1990/01/27, 28 y.o.   MRN: [id number]  Chief Complaint  Patient presents with  . Medical Management of Chronic Issues   Pt presents to the office today for chronic follow up.  Anxiety  Presents for follow-up visit. Symptoms include decreased concentration, depressed mood, excessive worry and nervous/anxious behavior. Patient reports no irritability, restlessness or shortness of breath. Symptoms occur occasionally. The severity of symptoms is moderate. The quality of sleep is good.    Hypertension  This is a chronic problem. The current episode started more than 1 year ago. The problem has been waxing and waning since onset. The problem is controlled. Associated symptoms include anxiety. Pertinent negatives include no malaise/fatigue, peripheral edema or shortness of breath. Risk factors for coronary artery disease include dyslipidemia, diabetes mellitus, obesity and sedentary lifestyle. The current treatment provides moderate improvement. There is no history of kidney disease, CAD/MI or heart failure. Identifiable causes of hypertension include a thyroid problem.  Migraine   This is a chronic problem. The current episode started more than 1 year ago. The problem occurs monthly. The pain quality is similar to prior headaches. Her past medical history is significant for hypertension.  Thyroid Problem  Presents for follow-up visit. Symptoms include anxiety, depressed mood and hair loss. Patient reports no constipation, dry skin or fatigue. The symptoms have been stable. Her past medical history is significant for hyperlipidemia. There is no history of heart failure.  Depression         This is a chronic problem.  The current episode started more than 1 year ago.   The onset quality is gradual.   The problem occurs intermittently.  Associated symptoms include decreased concentration.  Associated symptoms include no fatigue and no  restlessness.  Past treatments include nothing.  Past medical history includes thyroid problem and anxiety.   Hyperlipidemia  This is a chronic problem. The current episode started more than 1 year ago. The problem is uncontrolled. Recent lipid tests were reviewed and are high. Pertinent negatives include no shortness of breath. Current antihyperlipidemic treatment includes diet change. The current treatment provides mild improvement of lipids. Risk factors for coronary artery disease include dyslipidemia, hypertension and a sedentary lifestyle.  Metabolic Syndrome  PT states she has lost 62 lb July 2019. She states over the last few months she has not lost any and trying to maintain. She has been taking the phentermine. 9    Review of Systems  Constitutional: Negative for fatigue, irritability and malaise/fatigue.  Respiratory: Negative for shortness of breath.   Gastrointestinal: Negative for constipation.  Psychiatric/Behavioral: Positive for decreased concentration and depression. The patient is nervous/anxious.   All other systems reviewed and are negative.      Objective:   Physical Exam Vitals signs reviewed.  Constitutional:      General: She is not in acute distress.    Appearance: She is well-developed.  HENT:     Head: Normocephalic and atraumatic.     Right Ear: Tympanic membrane normal.     Left Ear: Tympanic membrane normal.  Eyes:     Pupils: Pupils are equal, round, and reactive to light.  Neck:     Musculoskeletal: Normal range of motion and neck supple.     Thyroid: No thyromegaly.  Cardiovascular:     Rate and Rhythm: Normal rate and regular rhythm.     Heart sounds: Normal heart sounds. No murmur.  Pulmonary:  Effort: Pulmonary effort is normal. No respiratory distress.     Breath sounds: Normal breath sounds. No wheezing.  Abdominal:     General: Bowel sounds are normal. There is no distension.     Palpations: Abdomen is soft.     Tenderness: There is  no abdominal tenderness.  Musculoskeletal: Normal range of motion.        General: No tenderness.  Skin:    General: Skin is warm and dry.  Neurological:     Mental Status: She is alert and oriented to person, place, and time.     Cranial Nerves: No cranial nerve deficit.     Deep Tendon Reflexes: Reflexes are normal and symmetric.  Psychiatric:        Behavior: Behavior normal.        Thought Content: Thought content normal.        Judgment: Judgment normal.          BP 117/83   Pulse 98   Temp 98.6 F (37 C) (Oral)   Ht 5' 2"  (1.575 m)   Wt 237 lb 12.8 oz (107.9 kg)   BMI 43.49 kg/m   Assessment & Plan:  Nakkia Mackiewicz comes in today with chief complaint of Medical Management of Chronic Issues   Diagnosis and orders addressed:  1. Essential hypertension - CMP14+EGFR - CBC with Differential/Platelet  2. Moderate episode of recurrent major depressive disorder (HCC) - CMP14+EGFR - CBC with Differential/Platelet  3. GAD (generalized anxiety disorder) - CMP14+EGFR - CBC with Differential/Platelet  4. Gastroesophageal reflux disease with esophagitis - CMP14+EGFR - CBC with Differential/Platelet  5. Hyperlipidemia, unspecified hyperlipidemia type - CMP14+EGFR - CBC with Differential/Platelet - Lipid panel  6. Hypothyroidism, unspecified type - CMP14+EGFR - CBC with Differential/Platelet - TSH  7. Morbid obesity with BMI of 45.0-49.9, adult (HCC) - CMP14+EGFR - CBC with Differential/Platelet - Semaglutide,0.25 or 0.5MG/DOS, (OZEMPIC, 0.25 OR 0.5 MG/DOSE,) 2 MG/1.5ML SOPN; Inject 0.25 mg into the skin once a week for 28 days, THEN 0.5 mg once a week for 28 days.  Dispense: 12 pen; Refill: 1  8. Metabolic syndrome Will start Ozempic today Encourage exercise and healthy diet- Continue working with nutritionist - CMP14+EGFR - CBC with Differential/Platelet - Semaglutide,0.25 or 0.5MG/DOS, (OZEMPIC, 0.25 OR 0.5 MG/DOSE,) 2 MG/1.5ML SOPN; Inject 0.25  mg into the skin once a week for 28 days, THEN 0.5 mg once a week for 28 days.  Dispense: 12 pen; Refill: 1  9. 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 Controlled substance agreement signed - CMP14+EGFR - CBC with Differential/Platelet - ToxASSURE Select 13 (MW), Urine  11. Benzodiazepine dependence ([place]) - CMP14+EGFR - CBC with Differential/Platelet - ToxASSURE Select 13 (MW), Urine   Labs pending Health Maintenance reviewed Diet and exercise encouraged  Follow up plan: 6 months    Evelina Dun, FNP

## 2018-06-12 LAB — CBC WITH DIFFERENTIAL/PLATELET
Basophils Absolute: 0.1 10*3/uL (ref 0.0–0.2)
Basos: 0 %
EOS (ABSOLUTE): 0.5 10*3/uL — ABNORMAL HIGH (ref 0.0–0.4)
Eos: 4 %
Hematocrit: 40.7 % (ref 34.0–46.6)
Hemoglobin: 13.7 g/dL (ref 11.1–15.9)
Immature Grans (Abs): 0 10*3/uL (ref 0.0–0.1)
Immature Granulocytes: 0 %
Lymphocytes Absolute: 3.7 10*3/uL — ABNORMAL HIGH (ref 0.7–3.1)
Lymphs: 31 %
MCH: 26.3 pg — ABNORMAL LOW (ref 26.6–33.0)
MCHC: 33.7 g/dL (ref 31.5–35.7)
MCV: 78 fL — ABNORMAL LOW (ref 79–97)
Monocytes Absolute: 1.1 10*3/uL — ABNORMAL HIGH (ref 0.1–0.9)
Monocytes: 9 %
Neutrophils Absolute: 6.8 10*3/uL (ref 1.4–7.0)
Neutrophils: 56 %
Platelets: 557 10*3/uL — ABNORMAL HIGH (ref 150–450)
RBC: 5.21 x10E6/uL (ref 3.77–5.28)
RDW: 14.5 % (ref 11.7–15.4)
WBC: 12.2 10*3/uL — ABNORMAL HIGH (ref 3.4–10.8)

## 2018-06-12 LAB — LIPID PANEL
Chol/HDL Ratio: 3.8 ratio (ref 0.0–4.4)
Cholesterol, Total: 198 mg/dL (ref 100–199)
HDL: 52 mg/dL (ref >39)
LDL Calculated: 115 mg/dL — ABNORMAL HIGH (ref 0–99)
Triglycerides: 154 mg/dL — ABNORMAL HIGH (ref 0–149)
VLDL Cholesterol Cal: 31 mg/dL (ref 5–40)

## 2018-06-12 LAB — CMP14+EGFR
ALT: 14 IU/L (ref 0–32)
AST: 12 IU/L (ref 0–40)
Albumin/Globulin Ratio: 1.6 (ref 1.2–2.2)
Albumin: 4.5 g/dL (ref 3.9–5.0)
Alkaline Phosphatase: 72 IU/L (ref 39–117)
BUN/Creatinine Ratio: 16 (ref 9–23)
BUN: 11 mg/dL (ref 6–20)
Bilirubin Total: 0.3 mg/dL (ref 0.0–1.2)
CO2: 22 mmol/L (ref 20–29)
Calcium: 9.6 mg/dL (ref 8.7–10.2)
Chloride: 101 mmol/L (ref 96–106)
Creatinine, Ser: 0.67 mg/dL (ref 0.57–1.00)
GFR calc Af Amer: 137 mL/min/{1.73_m2} (ref >59)
GFR calc non Af Amer: 119 mL/min/{1.73_m2} (ref >59)
Globulin, Total: 2.9 g/dL (ref 1.5–4.5)
Glucose: 82 mg/dL (ref 65–99)
Potassium: 4.5 mmol/L (ref 3.5–5.2)
Sodium: 137 mmol/L (ref 134–144)
Total Protein: 7.4 g/dL (ref 6.0–8.5)

## 2018-06-12 LAB — TSH: TSH: 3.87 u[IU]/mL (ref 0.450–4.500)

## 2018-06-14 ENCOUNTER — Other Ambulatory Visit: Payer: Self-pay | Admitting: Family

## 2018-06-14 DIAGNOSIS — R7989 Other specified abnormal findings of blood chemistry: Secondary | ICD-10-CM

## 2018-06-15 LAB — TOXASSURE SELECT 13 (MW), URINE

## 2018-06-20 ENCOUNTER — Other Ambulatory Visit: Payer: Self-pay | Admitting: *Deleted

## 2018-06-20 DIAGNOSIS — F411 Generalized anxiety disorder: Secondary | ICD-10-CM

## 2018-06-20 NOTE — Telephone Encounter (Signed)
Last seen 06/11/2018

## 2018-06-24 MED ORDER — ALPRAZOLAM 0.5 MG PO TABS: 0.5000 mg | ORAL_TABLET | Freq: Every evening | ORAL | 4 refills | Status: DC | PRN

## 2018-07-30 ENCOUNTER — Encounter: Payer: Self-pay | Admitting: Gynecology

## 2018-08-09 ENCOUNTER — Encounter: Payer: Self-pay | Admitting: Gynecology

## 2018-08-09 ENCOUNTER — Other Ambulatory Visit: Payer: Self-pay

## 2018-08-09 ENCOUNTER — Ambulatory Visit: Payer: BC Managed Care – PPO | Admitting: Gynecology

## 2018-08-09 VITALS — BP 124/82

## 2018-08-09 DIAGNOSIS — N912 Amenorrhea, unspecified: Secondary | ICD-10-CM

## 2018-08-09 DIAGNOSIS — Z3201 Encounter for pregnancy test, result positive: Secondary | ICD-10-CM

## 2018-08-09 LAB — PREGNANCY, URINE: Preg Test, Ur: NEGATIVE

## 2018-08-09 NOTE — Progress Notes (Signed)
    Diana Avery Oct 08, 1990 [id number]        28 y.o.  G3P0020 presents with positive home pregnancy test x3.  1 day late for menses.  Has lost a significant amount of weight and has resumed regular monthly menses.  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  No bleeding or cramping  Past medical history,surgical history, problem list, medications, allergies, family history and social history were all reviewed and documented in the EPIC chart.  Directed ROS with pertinent positives and negatives documented in the history of present illness/assessment and plan.  Exam: Caryn Bee assistant Vitals:   08/09/18 1439  BP: 124/82   General appearance:  Normal Abdomen soft nontender without masses guarding rebound Pelvic external BUS vagina normal.  Cervix normal.  Uterus difficult to palpate but no gross masses or tenderness.  Assessment/Plan:  28 y.o. G3P0020 with home pregnancy test positive.  UPT here is negative.  Discussed possibility of chemical pregnancy versus false positives.  Check quantitative hCG and progesterone level.  Rh+ history.  Possible work-up for habitual abortion    Anastasio Auerbach MD, 2:59 PM 08/09/2018

## 2018-08-09 NOTE — Patient Instructions (Signed)
Office will call you with blood test results on Monday.

## 2018-08-09 NOTE — Addendum Note (Signed)
Addended by: Nelva Nay on: 08/09/2018 03:24 PM   Modules accepted: Orders

## 2018-08-10 LAB — HCG, QUANTITATIVE, PREGNANCY: HCG, Total, QN: 18 m[IU]/mL

## 2018-08-10 LAB — PROGESTERONE: Progesterone: 12.5 ng/mL

## 2018-08-12 ENCOUNTER — Other Ambulatory Visit: Payer: Self-pay | Admitting: Gynecology

## 2018-08-12 DIAGNOSIS — N912 Amenorrhea, unspecified: Secondary | ICD-10-CM

## 2018-08-15 ENCOUNTER — Other Ambulatory Visit: Payer: BC Managed Care – PPO

## 2018-08-15 ENCOUNTER — Other Ambulatory Visit: Payer: Self-pay

## 2018-08-15 DIAGNOSIS — N912 Amenorrhea, unspecified: Secondary | ICD-10-CM

## 2018-08-16 ENCOUNTER — Telehealth: Payer: Self-pay | Admitting: *Deleted

## 2018-08-16 DIAGNOSIS — N912 Amenorrhea, unspecified: Secondary | ICD-10-CM

## 2018-08-16 LAB — HCG, QUANTITATIVE, PREGNANCY: HCG, Total, QN: 9 m[IU]/mL

## 2018-08-16 NOTE — Telephone Encounter (Signed)
-----   Message from Anastasio Auerbach, MD sent at 08/16/2018  7:56 AM EDT ----- Tell patient hCG is still falling but not quite 0.  Recommend repeat of 1 more time in 1 week with a quantitative hCG

## 2018-08-16 NOTE — Telephone Encounter (Signed)
Patient informed. 

## 2018-08-16 NOTE — Telephone Encounter (Signed)
Yes I anticipate that she will have a bleeding episode that will be a resolution of the "chemical" pregnancy.

## 2018-08-16 NOTE — Telephone Encounter (Signed)
Patient informed with below, lab appointment scheduled next week. Patient said last night she started spotting, this am light bleeding with small clots, asked if should just monitor for now?

## 2018-08-19 ENCOUNTER — Telehealth: Payer: Self-pay

## 2018-08-19 NOTE — Telephone Encounter (Signed)
Last week patient left Disability forms to be reviewed and completed by Dr. Phineas Real. I called Sharita today and informed her the Disability forms are not needed (per Sharrie Rothman / Dr Phineas Real). Patient requested the forms to be shredded. Forms to be shredded.

## 2018-08-22 ENCOUNTER — Encounter: Payer: BC Managed Care – PPO | Admitting: Gynecology

## 2018-08-22 ENCOUNTER — Other Ambulatory Visit: Payer: Self-pay

## 2018-08-22 ENCOUNTER — Other Ambulatory Visit: Payer: BC Managed Care – PPO

## 2018-08-22 ENCOUNTER — Other Ambulatory Visit: Payer: Self-pay | Admitting: Family

## 2018-08-22 DIAGNOSIS — N912 Amenorrhea, unspecified: Secondary | ICD-10-CM

## 2018-08-22 MED ORDER — OZEMPIC (1 MG/DOSE) 2 MG/1.5ML ~~LOC~~ SOPN: 1.0000 mg | PEN_INJECTOR | SUBCUTANEOUS | 2 refills | Status: DC

## 2018-08-23 ENCOUNTER — Encounter: Payer: Self-pay | Admitting: Gynecology

## 2018-08-23 LAB — HCG, QUANTITATIVE, PREGNANCY: HCG, Total, QN: <3 m[IU]/mL

## 2018-08-27 ENCOUNTER — Other Ambulatory Visit: Payer: Self-pay

## 2018-08-28 ENCOUNTER — Encounter: Payer: Self-pay | Admitting: Gynecology

## 2018-08-28 ENCOUNTER — Ambulatory Visit: Payer: BC Managed Care – PPO | Admitting: Gynecology

## 2018-08-28 VITALS — BP 128/84 | Ht 63.0 in | Wt 224.0 lb

## 2018-08-28 DIAGNOSIS — Z01419 Encounter for gynecological examination (general) (routine) without abnormal findings: Secondary | ICD-10-CM | POA: Diagnosis not present

## 2018-08-28 NOTE — Progress Notes (Addendum)
    Diana Avery Aug 30, 1990 [id number]        28 y.o.  G5P0050 for annual gynecologic exam.  Had recent chemical pregnancy with spontaneous loss.  Past medical history,surgical history, problem list, medications, allergies, family history and social history were all reviewed and documented as reviewed in the EPIC chart.  ROS:  Performed with pertinent positives and negatives included in the history, assessment and plan.   Additional significant findings: None   Exam: Caryn Bee assistant Vitals:   08/28/18 1128  BP: 128/84  Weight: 224 lb (101.6 kg)  Height: 5\' 3"  (1.6 m)   Body mass index is 39.68 kg/m.  General appearance:  Normal affect, orientation and appearance. Skin: Grossly normal HEENT: Without gross lesions.  No cervical or supraclavicular adenopathy. Thyroid normal.  Lungs:  Clear without wheezing, rales or rhonchi Cardiac: RR, without RMG Abdominal:  Soft, nontender, without masses, guarding, rebound, organomegaly or hernia Breasts:  Examined lying and sitting without masses, retractions, discharge or axillary adenopathy. Pelvic:  Ext, BUS, Vagina: Normal  Cervix: Normal  Uterus: Anteverted, normal size, shape and contour, midline and mobile nontender   Adnexa: Without masses or tenderness    Anus and perineum: Normal   Rectovaginal: Normal sphincter tone without palpated masses or tenderness.    Assessment/Plan:  28 y.o. G65P0050 female for annual gynecologic exam.   1. Habitual abortion.  Patient has a history of 5 pregnancies as follows:  #1 at age 27 loss at 36 weeks  #2 and #3 chemical pregnancies  #4 in 2017 with cardiac activity at 5 weeks 5 days.  Ultimately had D&C where POC had normal chromosomes reported  #5 most recently with early loss with mildly elevated hCG.  Early progesterone check 12.5    We discussed evaluation for habitual abortion.  She did have a sonohysterogram which did not show significant uterine abnormalities.  Also had  normal thyroid screening.  Further studies to include peripheral chromosome analyses of the parents, antibody studies such as lupus anticoagulant ANA and luteal phase deficiency evaluation discussed.  Options for reproductive endocrinology referral.  We did discuss that there are further things to evaluate and other possibilities to achieve pregnancy and I encouraged her to consider these.  At this point the patient is not interested in pursuing anything will call if she changes her mind.  Does not plan on using contraception at this point as husband is considering vasectomy.  2.   Breast health.  Breast exam normal today. 3. Pap smear 2019.  No Pap smear done today.  No history of abnormal Pap smears previously.  Plan repeat Pap smear/HPV at 3-year interval. 4.   Health maintenance.  No routine lab work done as patient has this done elsewhere.  Follow-up 1 year, sooner as needed.   Anastasio Auerbach MD, 11:55 AM 08/28/2018

## 2018-08-28 NOTE — Patient Instructions (Addendum)
Follow-up in 1 year for annual exam  Call if you want to pursue further evaluation in reference to repeated miscarriages

## 2018-08-30 ENCOUNTER — Other Ambulatory Visit: Payer: Self-pay

## 2018-09-02 ENCOUNTER — Ambulatory Visit: Payer: BC Managed Care – PPO | Admitting: Family

## 2018-09-02 ENCOUNTER — Encounter: Payer: Self-pay | Admitting: Family

## 2018-09-02 ENCOUNTER — Other Ambulatory Visit: Payer: Self-pay

## 2018-09-02 VITALS — BP 101/66 | HR 92 | Temp 98.4°F | Ht 63.0 in | Wt 227.2 lb

## 2018-09-02 DIAGNOSIS — I959 Hypotension, unspecified: Secondary | ICD-10-CM | POA: Diagnosis not present

## 2018-09-02 DIAGNOSIS — Z713 Dietary counseling and surveillance: Secondary | ICD-10-CM | POA: Diagnosis not present

## 2018-09-02 DIAGNOSIS — I10 Essential (primary) hypertension: Secondary | ICD-10-CM

## 2018-09-02 DIAGNOSIS — Z6841 Body Mass Index (BMI) 40.0 and over, adult: Secondary | ICD-10-CM

## 2018-09-02 MED ORDER — OZEMPIC (1 MG/DOSE) 2 MG/1.5ML ~~LOC~~ SOPN: 1.0000 mg | PEN_INJECTOR | SUBCUTANEOUS | 2 refills | Status: DC

## 2018-09-02 MED ORDER — HYDROCHLOROTHIAZIDE 25 MG PO TABS: 12.5000 mg | ORAL_TABLET | Freq: Every day | ORAL | 3 refills | Status: DC

## 2018-09-02 NOTE — Progress Notes (Signed)
   Subjective:    Patient ID: Diana Avery, female    DOB: 05/07/1990, 28 y.o.   MRN: [id number]  Chief Complaint  Patient presents with  . follow up weight management    HPI PT presents to the office today for follow up on weight lost. She has lost 10 lbs over the last three months with taking Ozempic. She reports mild constipation, but resolves when she drinks coffee.    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 She states she has tried to eat healthier and less processed foods.    Review of Systems  All other systems reviewed and are negative.      Objective:   Physical Exam Vitals signs reviewed.  Constitutional:      General: She is not in acute distress.    Appearance: She is well-developed. She is obese.  HENT:     Head: Normocephalic and atraumatic.     Right Ear: External ear normal.  Eyes:     Pupils: Pupils are equal, round, and reactive to light.  Neck:     Musculoskeletal: Normal range of motion and neck supple.     Thyroid: No thyromegaly.  Cardiovascular:     Rate and Rhythm: Normal rate and regular rhythm.     Heart sounds: Normal heart sounds. No murmur.  Pulmonary:     Effort: Pulmonary effort is normal. No respiratory distress.     Breath sounds: Normal breath sounds. No wheezing.  Abdominal:     General: Bowel sounds are normal. There is no distension.     Palpations: Abdomen is soft.     Tenderness: There is no abdominal tenderness.  Musculoskeletal: Normal range of motion.        General: No tenderness.  Skin:    General: Skin is warm and dry.  Neurological:     Mental Status: She is alert and oriented to person, place, and time.     Cranial Nerves: No cranial nerve deficit.     Deep Tendon Reflexes: Reflexes are normal and symmetric.  Psychiatric:        Behavior: Behavior normal.        Thought Content: Thought content normal.        Judgment: Judgment normal.       BP 101/66   Pulse 92   Temp 98.4 F (36.9  C) (Temporal)   Ht '5\' 3"'$  (1.6 m)   Wt 227 lb 3.2 oz (103.1 kg)   LMP 08/16/2018   BMI 40.25 kg/m      Assessment & Plan:  Diana Avery comes in today with chief complaint of follow up weight management   Diagnosis and orders addressed:  1. Weight loss counseling, encounter for Continue weight loss Keep up the great work [employer] and encouraged exercise  - Semaglutide, 1 MG/DOSE, (OZEMPIC, 1 MG/DOSE,) 2 MG/1.5ML SOPN; Inject 1 mg into the skin once a week.  Dispense: 12 pen; Refill: 2 - CMP14+EGFR  2. Morbid obesity with BMI of 45.0-49.9, adult (HCC) - Semaglutide, 1 MG/DOSE, (OZEMPIC, 1 MG/DOSE,) 2 MG/1.5ML SOPN; Inject 1 mg into the skin once a week.  Dispense: 12 pen; Refill: 2 - CMP14+EGFR   3. Hypotension, unspecified hypotension type Will decrease HCTZ 12.5 mg  Force fluids     Diana Dun, FNP

## 2018-09-02 NOTE — Patient Instructions (Signed)

## 2018-09-03 LAB — CMP14+EGFR
ALT: 28 IU/L (ref 0–32)
AST: 28 IU/L (ref 0–40)
Albumin/Globulin Ratio: 1.6 (ref 1.2–2.2)
Albumin: 4.5 g/dL (ref 3.9–5.0)
Alkaline Phosphatase: 63 IU/L (ref 39–117)
BUN/Creatinine Ratio: 19 (ref 9–23)
BUN: 11 mg/dL (ref 6–20)
Bilirubin Total: <0.2 mg/dL (ref 0.0–1.2)
CO2: 24 mmol/L (ref 20–29)
Calcium: 9.3 mg/dL (ref 8.7–10.2)
Chloride: 99 mmol/L (ref 96–106)
Creatinine, Ser: 0.57 mg/dL (ref 0.57–1.00)
GFR calc Af Amer: 145 mL/min/{1.73_m2} (ref >59)
GFR calc non Af Amer: 126 mL/min/{1.73_m2} (ref >59)
Globulin, Total: 2.8 g/dL (ref 1.5–4.5)
Glucose: 82 mg/dL (ref 65–99)
Potassium: 4.6 mmol/L (ref 3.5–5.2)
Sodium: 136 mmol/L (ref 134–144)
Total Protein: 7.3 g/dL (ref 6.0–8.5)

## 2018-09-05 ENCOUNTER — Ambulatory Visit: Payer: BC Managed Care – PPO | Admitting: Family

## 2018-09-17 ENCOUNTER — Ambulatory Visit: Payer: BC Managed Care – PPO | Admitting: Family

## 2018-10-14 ENCOUNTER — Encounter: Payer: Self-pay | Admitting: Gynecology

## 2018-11-01 ENCOUNTER — Other Ambulatory Visit: Payer: Self-pay | Admitting: Family

## 2018-11-01 DIAGNOSIS — I10 Essential (primary) hypertension: Secondary | ICD-10-CM

## 2018-12-30 ENCOUNTER — Ambulatory Visit: Payer: BC Managed Care – PPO | Admitting: Gynecology

## 2018-12-30 ENCOUNTER — Other Ambulatory Visit: Payer: Self-pay

## 2018-12-30 ENCOUNTER — Encounter: Payer: Self-pay | Admitting: Gynecology

## 2018-12-30 VITALS — BP 124/80

## 2018-12-30 DIAGNOSIS — N898 Other specified noninflammatory disorders of vagina: Secondary | ICD-10-CM | POA: Diagnosis not present

## 2018-12-30 LAB — WET PREP FOR TRICH, YEAST, CLUE

## 2018-12-30 MED ORDER — FLUCONAZOLE 150 MG PO TABS: 150.0000 mg | ORAL_TABLET | Freq: Once | ORAL | 0 refills | Status: AC

## 2018-12-30 NOTE — Patient Instructions (Signed)
Take the 1 Diflucan pill to treat the yeast infection.  Save the other pill to use in the future if needed.  Follow-up if your symptoms continue.

## 2018-12-30 NOTE — Progress Notes (Signed)
    Diana Avery [birth year] [id number]        28 y.o.  G5P0050 presents with 1 week history of vaginal irritation and discharge.  Had used menstrual cup for the first time last week.  Noticed some irritation starting during this time.  Increased this morning.  No odor.  No UTI symptoms.  Past medical history,surgical history, problem list, medications, allergies, family history and social history were all reviewed and documented in the EPIC chart.  Directed ROS with pertinent positives and negatives documented in the history of present illness/assessment and plan.  Exam: Caryn Bee assistant Vitals:   12/30/18 1421  BP: 124/80   General appearance:  Normal Abdomen soft nontender without mass guarding rebound Pelvic external BUS vagina with white discharge.  Cervix normal.  Uterus grossly normal midline mobile nontender.  Adnexa without masses or tenderness.  Assessment/Plan:  28 y.o. G5P0050 with history and exam as above.  Wet prep is positive for yeast.  Will treat with Diflucan 150 mg x 1 dose.  We will follow-up if her symptoms continue or recur.  I did give her an extra Diflucan to have for next month when she uses the menstrual cup again if she has a recurrence.    Anastasio Auerbach MD, 2:39 PM 12/30/2018

## 2019-01-26 ENCOUNTER — Other Ambulatory Visit: Payer: Self-pay | Admitting: Family

## 2019-01-26 DIAGNOSIS — F411 Generalized anxiety disorder: Secondary | ICD-10-CM

## 2019-04-28 ENCOUNTER — Other Ambulatory Visit: Payer: Self-pay | Admitting: Family

## 2019-04-28 DIAGNOSIS — I10 Essential (primary) hypertension: Secondary | ICD-10-CM

## 2019-05-22 ENCOUNTER — Other Ambulatory Visit: Payer: Self-pay | Admitting: Family

## 2019-05-22 DIAGNOSIS — I10 Essential (primary) hypertension: Secondary | ICD-10-CM

## 2019-05-22 NOTE — Telephone Encounter (Signed)
[facility]. NTBS 30 days given 04/28/19

## 2019-05-22 NOTE — Telephone Encounter (Signed)
Left detailed message on patients voicemail to contact office for an appointment

## 2019-07-24 ENCOUNTER — Other Ambulatory Visit: Payer: Self-pay

## 2019-07-24 ENCOUNTER — Encounter: Payer: Self-pay | Admitting: Family

## 2019-07-24 ENCOUNTER — Ambulatory Visit: Payer: BC Managed Care – PPO | Admitting: Family

## 2019-07-24 VITALS — BP 115/74 | HR 89 | Temp 98.0°F | Ht 63.0 in | Wt 262.4 lb

## 2019-07-24 DIAGNOSIS — Z6841 Body Mass Index (BMI) 40.0 and over, adult: Secondary | ICD-10-CM

## 2019-07-24 DIAGNOSIS — I10 Essential (primary) hypertension: Secondary | ICD-10-CM | POA: Diagnosis not present

## 2019-07-24 DIAGNOSIS — E039 Hypothyroidism, unspecified: Secondary | ICD-10-CM | POA: Diagnosis not present

## 2019-07-24 DIAGNOSIS — Z713 Dietary counseling and surveillance: Secondary | ICD-10-CM

## 2019-07-24 DIAGNOSIS — E8881 Metabolic syndrome: Secondary | ICD-10-CM | POA: Diagnosis not present

## 2019-07-24 MED ORDER — PHENTERMINE HCL 37.5 MG PO CAPS: 37.5000 mg | ORAL_CAPSULE | ORAL | 2 refills | Status: DC

## 2019-07-24 MED ORDER — HYDROCHLOROTHIAZIDE 25 MG PO TABS: 25.0000 mg | ORAL_TABLET | Freq: Every day | ORAL | 3 refills | Status: DC

## 2019-07-24 MED ORDER — LEVOTHYROXINE SODIUM 150 MCG PO TABS: 150.0000 ug | ORAL_TABLET | Freq: Every day | ORAL | 3 refills | Status: DC

## 2019-07-24 NOTE — Patient Instructions (Signed)

## 2019-07-24 NOTE — Progress Notes (Signed)
Subjective:    Patient ID: Diana Avery, female    DOB: 11/13/1990, 29 y.o.   MRN: [id number]  Chief Complaint  Patient presents with  . Medical Management of Chronic Issues    fasting  . Weight Check    Wants rx   PT presents to the office today for chronic follow up. She reports she has gained 40 lb in the last 4 months. She reports she stopped the Ozempic in 11/2018 because of nausea. She reports she has taken phentermine the past and lost 50 lb and would like to try this again. She does reports her stress has been increased.  Hypertension This is a chronic problem. The current episode started more than 1 year ago. The problem has been resolved since onset. The problem is controlled. Associated symptoms include malaise/fatigue ("some times"). Pertinent negatives include no peripheral edema or shortness of breath. Risk factors for coronary artery disease include obesity and sedentary lifestyle. Past treatments include diuretics. The current treatment provides moderate improvement. There is no history of CVA or heart failure. Identifiable causes of hypertension include a thyroid problem.  Thyroid Problem Presents for follow-up visit. Symptoms include anxiety and fatigue. Patient reports no nail problem, tremors or weight gain. The symptoms have been stable. There is no history of heart failure.      Review of Systems  Constitutional: Positive for fatigue and malaise/fatigue ("some times"). Negative for weight gain.  Respiratory: Negative for shortness of breath.   Neurological: Negative for tremors.  Psychiatric/Behavioral: The patient is nervous/anxious.        Objective:   Physical Exam Vitals reviewed.  Constitutional:      General: She is not in acute distress.    Appearance: She is well-developed. She is obese.  HENT:     Head: Normocephalic and atraumatic.     Right Ear: Tympanic membrane normal.     Left Ear: Tympanic membrane normal.  Eyes:     Pupils: Pupils  are equal, round, and reactive to light.  Neck:     Thyroid: No thyromegaly.  Cardiovascular:     Rate and Rhythm: Normal rate and regular rhythm.     Heart sounds: Normal heart sounds. No murmur heard.   Pulmonary:     Effort: Pulmonary effort is normal. No respiratory distress.     Breath sounds: Normal breath sounds. No wheezing.  Abdominal:     General: Bowel sounds are normal. There is no distension.     Palpations: Abdomen is soft.     Tenderness: There is no abdominal tenderness.  Musculoskeletal:        General: No tenderness. Normal range of motion.     Cervical back: Normal range of motion and neck supple.  Skin:    General: Skin is warm and dry.  Neurological:     Mental Status: She is alert and oriented to person, place, and time.     Cranial Nerves: No cranial nerve deficit.     Deep Tendon Reflexes: Reflexes are normal and symmetric.  Psychiatric:        Behavior: Behavior normal.        Thought Content: Thought content normal.        Judgment: Judgment normal.       BP 115/74   Pulse 89   Temp 98 F (36.7 C) (Temporal)   Ht 5' 3"  (1.6 m)   Wt 262 lb 6.4 oz (119 kg)   BMI 46.48 kg/m  Assessment & Plan:  Diana Avery comes in today with chief complaint of Medical Management of Chronic Issues (fasting) and Weight Check (Wants rx)   Diagnosis and orders addressed:  1. Essential hypertension - hydrochlorothiazide (HYDRODIURIL) 25 MG tablet; Take 1 tablet (25 mg total) by mouth daily. (Needs to be seen before next refill)  Dispense: 90 tablet; Refill: 3 - CMP14+EGFR - CBC with Differential/Platelet  2. Hypothyroidism, unspecified type - levothyroxine (SYNTHROID) 150 MCG tablet; Take 1 tablet (150 mcg total) by mouth daily. (Needs to be seen before next refill)  Dispense: 90 tablet; Refill: 3 - CMP14+EGFR - CBC with Differential/Platelet - TSH  3. Morbid obesity with BMI of 45.0-49.9, adult (HCC) - CMP14+EGFR - CBC with  Differential/Platelet - phentermine 37.5 MG capsule; Take 1 capsule (37.5 mg total) by mouth every morning.  Dispense: 30 capsule; Refill: 2  4. Metabolic syndrome - RNH65+BXUX - CBC with Differential/Platelet - phentermine 37.5 MG capsule; Take 1 capsule (37.5 mg total) by mouth every morning.  Dispense: 30 capsule; Refill: 2  5. Weight loss counseling, encounter for Will start phentermine today Must lose 5% of her weight loss to continue medication RTO in 3 months  - CMP14+EGFR - CBC with Differential/Platelet - phentermine 37.5 MG capsule; Take 1 capsule (37.5 mg total) by mouth every morning.  Dispense: 30 capsule; Refill: 2   Labs pending Health Maintenance reviewed Diet and exercise encouraged  Follow up plan: 3 months    Evelina Dun, FNP

## 2019-07-25 ENCOUNTER — Other Ambulatory Visit: Payer: Self-pay | Admitting: Family Medicine

## 2019-07-25 LAB — CBC WITH DIFFERENTIAL/PLATELET
Basophils Absolute: 0.1 10*3/uL (ref 0.0–0.2)
Basos: 1 %
EOS (ABSOLUTE): 0.6 10*3/uL — ABNORMAL HIGH (ref 0.0–0.4)
Eos: 6 %
Hematocrit: 36.2 % (ref 34.0–46.6)
Hemoglobin: 12.2 g/dL (ref 11.1–15.9)
Immature Grans (Abs): 0 10*3/uL (ref 0.0–0.1)
Immature Granulocytes: 0 %
Lymphocytes Absolute: 3 10*3/uL (ref 0.7–3.1)
Lymphs: 29 %
MCH: 27.2 pg (ref 26.6–33.0)
MCHC: 33.7 g/dL (ref 31.5–35.7)
MCV: 81 fL (ref 79–97)
Monocytes Absolute: 0.8 10*3/uL (ref 0.1–0.9)
Monocytes: 7 %
Neutrophils Absolute: 6 10*3/uL (ref 1.4–7.0)
Neutrophils: 57 %
Platelets: 411 10*3/uL (ref 150–450)
RBC: 4.49 x10E6/uL (ref 3.77–5.28)
RDW: 13.8 % (ref 11.7–15.4)
WBC: 10.4 10*3/uL (ref 3.4–10.8)

## 2019-07-25 LAB — CMP14+EGFR
ALT: 13 IU/L (ref 0–32)
AST: 13 IU/L (ref 0–40)
Albumin/Globulin Ratio: 1.8 (ref 1.2–2.2)
Albumin: 4.4 g/dL (ref 3.9–5.0)
Alkaline Phosphatase: 61 IU/L (ref 48–121)
BUN/Creatinine Ratio: 16 (ref 9–23)
BUN: 10 mg/dL (ref 6–20)
Bilirubin Total: 0.4 mg/dL (ref 0.0–1.2)
CO2: 23 mmol/L (ref 20–29)
Calcium: 9.3 mg/dL (ref 8.7–10.2)
Chloride: 104 mmol/L (ref 96–106)
Creatinine, Ser: 0.61 mg/dL (ref 0.57–1.00)
GFR calc Af Amer: 141 mL/min/{1.73_m2} (ref >59)
GFR calc non Af Amer: 122 mL/min/{1.73_m2} (ref >59)
Globulin, Total: 2.5 g/dL (ref 1.5–4.5)
Glucose: 81 mg/dL (ref 65–99)
Potassium: 4.3 mmol/L (ref 3.5–5.2)
Sodium: 140 mmol/L (ref 134–144)
Total Protein: 6.9 g/dL (ref 6.0–8.5)

## 2019-07-25 LAB — TSH: TSH: 6.08 u[IU]/mL — ABNORMAL HIGH (ref 0.450–4.500)

## 2019-07-25 MED ORDER — LEVOTHYROXINE SODIUM 175 MCG PO TABS: 175.0000 ug | ORAL_TABLET | Freq: Every day | ORAL | 0 refills | Status: DC

## 2019-07-25 NOTE — Telephone Encounter (Signed)
Diana Avery has not seen labs yet to address change in thyroid medication.

## 2019-07-29 ENCOUNTER — Other Ambulatory Visit: Payer: Self-pay | Admitting: Family

## 2019-07-29 MED ORDER — LEVOTHYROXINE SODIUM 200 MCG PO TABS
200.0000 ug | ORAL_TABLET | Freq: Every day | ORAL | 11 refills | Status: DC
Start: 2019-07-29 — End: 2019-07-29

## 2019-07-29 MED ORDER — LEVOTHYROXINE SODIUM 175 MCG PO TABS: 175.0000 ug | ORAL_TABLET | Freq: Every day | ORAL | 2 refills | Status: DC

## 2019-08-29 ENCOUNTER — Encounter: Payer: BC Managed Care – PPO | Admitting: Obstetrics and Gynecology

## 2019-10-24 ENCOUNTER — Encounter: Payer: Self-pay | Admitting: Family

## 2019-10-24 ENCOUNTER — Ambulatory Visit (INDEPENDENT_AMBULATORY_CARE_PROVIDER_SITE_OTHER): Payer: BC Managed Care – PPO | Admitting: Family

## 2019-10-24 ENCOUNTER — Other Ambulatory Visit: Payer: Self-pay

## 2019-10-24 VITALS — BP 117/83 | HR 93 | Temp 97.8°F | Ht 63.0 in | Wt 243.2 lb

## 2019-10-24 DIAGNOSIS — E282 Polycystic ovarian syndrome: Secondary | ICD-10-CM

## 2019-10-24 DIAGNOSIS — Z713 Dietary counseling and surveillance: Secondary | ICD-10-CM | POA: Diagnosis not present

## 2019-10-24 DIAGNOSIS — D691 Qualitative platelet defects: Secondary | ICD-10-CM | POA: Insufficient documentation

## 2019-10-24 DIAGNOSIS — Z6841 Body Mass Index (BMI) 40.0 and over, adult: Secondary | ICD-10-CM

## 2019-10-24 DIAGNOSIS — I10 Essential (primary) hypertension: Secondary | ICD-10-CM

## 2019-10-24 DIAGNOSIS — E039 Hypothyroidism, unspecified: Secondary | ICD-10-CM

## 2019-10-24 DIAGNOSIS — E8881 Metabolic syndrome: Secondary | ICD-10-CM | POA: Diagnosis not present

## 2019-10-24 MED ORDER — PHENTERMINE HCL 37.5 MG PO CAPS: 37.5000 mg | ORAL_CAPSULE | ORAL | 2 refills | Status: DC

## 2019-10-24 NOTE — Patient Instructions (Signed)
Hypothyroidism  Hypothyroidism is when the thyroid gland does not make enough of certain hormones (it is underactive). The thyroid gland is a small gland located in the lower front part of the neck, just in front of the windpipe (trachea). This gland makes hormones that help control how the body uses food for energy (metabolism) as well as how the heart and brain function. These hormones also play a role in keeping your bones strong. When the thyroid is underactive, it produces too little of the hormones thyroxine (T4) and triiodothyronine (T3). What are the causes? This condition may be caused by:  Hashimoto's disease. This is a disease in which the body's disease-fighting system (immune system) attacks the thyroid gland. This is the most common cause.  Viral infections.  Pregnancy.  Certain medicines.  Birth defects.  Past radiation treatments to the head or neck for cancer.  Past treatment with radioactive iodine.  Past exposure to radiation in the environment.  Past surgical removal of part or all of the thyroid.  Problems with a gland in the center of the brain (pituitary gland).  Lack of enough iodine in the diet. What increases the risk? You are more likely to develop this condition if:  You are female.  You have a family history of thyroid conditions.  You use a medicine called lithium.  You take medicines that affect the immune system (immunosuppressants). What are the signs or symptoms? Symptoms of this condition include:  Feeling as though you have no energy (lethargy).  Not being able to tolerate cold.  Weight gain that is not explained by a change in diet or exercise habits.  Lack of appetite.  Dry skin.  Coarse hair.  Menstrual irregularity.  Slowing of thought processes.  Constipation.  Sadness or depression. How is this diagnosed? This condition may be diagnosed based on:  Your symptoms, your medical history, and a physical exam.  Blood  tests. You may also have imaging tests, such as an ultrasound or MRI. How is this treated? This condition is treated with medicine that replaces the thyroid hormones that your body does not make. After you begin treatment, it may take several weeks for symptoms to go away. Follow these instructions at home:  Take over-the-counter and prescription medicines only as told by your health care provider.  If you start taking any new medicines, tell your health care provider.  Keep all follow-up visits as told by your health care provider. This is important. ? As your condition improves, your dosage of thyroid hormone medicine may change. ? You will need to have blood tests regularly so that your health care provider can monitor your condition. Contact a health care provider if:  Your symptoms do not get better with treatment.  You are taking thyroid replacement medicine and you: ? Sweat a lot. ? Have tremors. ? Feel anxious. ? Lose weight rapidly. ? Cannot tolerate heat. ? Have emotional swings. ? Have diarrhea. ? Feel weak. Get help right away if you have:  Chest pain.  An irregular heartbeat.  A rapid heartbeat.  Difficulty breathing. Summary  Hypothyroidism is when the thyroid gland does not make enough of certain hormones (it is underactive).  When the thyroid is underactive, it produces too little of the hormones thyroxine (T4) and triiodothyronine (T3).  The most common cause is Hashimoto's disease, a disease in which the body's disease-fighting system (immune system) attacks the thyroid gland. The condition can also be caused by viral infections, medicine, pregnancy, or past   radiation treatment to the head or neck.  Symptoms may include weight gain, dry skin, constipation, feeling as though you do not have energy, and not being able to tolerate cold.  This condition is treated with medicine to replace the thyroid hormones that your body does not make. This information  is not intended to replace advice given to you by your health care provider. Make sure you discuss any questions you have with your health care provider. Document Revised: 12/22/2016 Document Reviewed: 12/20/2016 Elsevier Patient Education  2020 Elsevier Inc.  

## 2019-10-24 NOTE — Progress Notes (Signed)
Subjective:    Patient ID: Diana Avery, female    DOB: [birth year], 29 y.o.   MRN: [id number]  Chief Complaint  Patient presents with  . Medical Management of Chronic Issues  . Hypothyroidism   PT presents to the office today for weight loss. She is currently taking phentermine and since our last visit has lost 19 lbs and a total of 56 lbs total. She is walking every other day for 20 min. And decreasing her calories to 1500 a day.   She wants to continue the phentermine at this time.   She is seeing a fertility specialists and her BP medication was changed to labetalol from HCTZ.  She was also started on metformin for her PCOS.  Thyroid Problem Presents for follow-up visit. Symptoms include fatigue. Patient reports no constipation, diarrhea or dry skin. The symptoms have been stable.  Hypertension This is a chronic problem. The current episode started more than 1 year ago. The problem has been resolved since onset. The problem is controlled. Associated symptoms include malaise/fatigue. Pertinent negatives include no peripheral edema or shortness of breath. Risk factors for coronary artery disease include dyslipidemia and diabetes mellitus. The current treatment provides moderate improvement. Identifiable causes of hypertension include a thyroid problem.      Review of Systems  Constitutional: Positive for fatigue and malaise/fatigue.  Respiratory: Negative for shortness of breath.   Gastrointestinal: Negative for constipation and diarrhea.  All other systems reviewed and are negative.      Objective:   Physical Exam Vitals reviewed.  Constitutional:      General: She is not in acute distress.    Appearance: She is well-developed. She is obese.  HENT:     Head: Normocephalic and atraumatic.     Right Ear: Tympanic membrane normal.     Left Ear: Tympanic membrane normal.  Eyes:     Pupils: Pupils are equal, round, and reactive to light.  Neck:     Thyroid: No  thyromegaly.  Cardiovascular:     Rate and Rhythm: Normal rate and regular rhythm.     Heart sounds: Normal heart sounds. No murmur heard.   Pulmonary:     Effort: Pulmonary effort is normal. No respiratory distress.     Breath sounds: Normal breath sounds. No wheezing.  Abdominal:     General: Bowel sounds are normal. There is no distension.     Palpations: Abdomen is soft.     Tenderness: There is no abdominal tenderness.  Musculoskeletal:        General: No tenderness. Normal range of motion.     Cervical back: Normal range of motion and neck supple.  Skin:    General: Skin is warm and dry.  Neurological:     Mental Status: She is alert and oriented to person, place, and time.     Cranial Nerves: No cranial nerve deficit.     Deep Tendon Reflexes: Reflexes are normal and symmetric.  Psychiatric:        Behavior: Behavior normal.        Thought Content: Thought content normal.        Judgment: Judgment normal.       BP 117/83   Pulse 93   Temp 97.8 F (36.6 C) (Temporal)   Ht _0  (1.6 m)   Wt 243 lb 3.2 oz (110.3 kg)   SpO2 99%   BMI 43.08 kg/m      Assessment & Plan:  Diana Avery comes  in today with chief complaint of Medical Management of Chronic Issues and Hypothyroidism   Diagnosis and orders addressed:  1. Morbid obesity with BMI of 45.0-49.9, adult (HCC) - CMP14+EGFR - phentermine 37.5 MG capsule; Take 1 capsule (37.5 mg total) by mouth every morning.  Dispense: 30 capsule; Refill: 2  2. Metabolic syndrome - VOH60+OVPC - phentermine 37.5 MG capsule; Take 1 capsule (37.5 mg total) by mouth every morning.  Dispense: 30 capsule; Refill: 2  3. Weight loss counseling, encounter for - CMP14+EGFR - phentermine 37.5 MG capsule; Take 1 capsule (37.5 mg total) by mouth every morning.  Dispense: 30 capsule; Refill: 2  4. Essential hypertension - CMP14+EGFR  5. PCOS (polycystic ovarian syndrome) - CMP14+EGFR  6. Hypothyroidism, unspecified  type - TSH  Will continue the phentermine, must continue to lose 5% of your weight to continue medication.  Labs pending Health Maintenance reviewed Diet and exercise encouraged  Follow up plan: 3 months    Evelina Dun, FNP

## 2019-10-25 LAB — TSH: TSH: 0.337 u[IU]/mL — ABNORMAL LOW (ref 0.450–4.500)

## 2019-10-25 LAB — CMP14+EGFR
ALT: 30 IU/L (ref 0–32)
AST: 19 IU/L (ref 0–40)
Albumin/Globulin Ratio: 1.8 (ref 1.2–2.2)
Albumin: 4.5 g/dL (ref 3.9–5.0)
Alkaline Phosphatase: 73 IU/L (ref 44–121)
BUN/Creatinine Ratio: 14 (ref 9–23)
BUN: 10 mg/dL (ref 6–20)
Bilirubin Total: 0.3 mg/dL (ref 0.0–1.2)
CO2: 25 mmol/L (ref 20–29)
Calcium: 9.2 mg/dL (ref 8.7–10.2)
Chloride: 98 mmol/L (ref 96–106)
Creatinine, Ser: 0.71 mg/dL (ref 0.57–1.00)
GFR calc Af Amer: 132 mL/min/{1.73_m2} (ref >59)
GFR calc non Af Amer: 115 mL/min/{1.73_m2} (ref >59)
Globulin, Total: 2.5 g/dL (ref 1.5–4.5)
Glucose: 91 mg/dL (ref 65–99)
Potassium: 4 mmol/L (ref 3.5–5.2)
Sodium: 137 mmol/L (ref 134–144)
Total Protein: 7 g/dL (ref 6.0–8.5)

## 2019-10-27 ENCOUNTER — Other Ambulatory Visit: Payer: Self-pay | Admitting: Family

## 2019-10-27 MED ORDER — LEVOTHYROXINE SODIUM 150 MCG PO TABS: 150.0000 ug | ORAL_TABLET | Freq: Every day | ORAL | 2 refills | Status: DC

## 2019-12-16 ENCOUNTER — Telehealth: Payer: Self-pay | Admitting: *Deleted

## 2019-12-16 NOTE — Telephone Encounter (Signed)
Received call from pharmacist stating that patient has received 2 different prescriptions of phentermine tablets from 2 different doctors since last prescription this office has sent per state registry. Last received on 12/05/19.  Pharmacist just wanted to make provider aware

## 2019-12-16 NOTE — Telephone Encounter (Signed)
Ok, thank you

## 2020-01-19 ENCOUNTER — Ambulatory Visit: Payer: BC Managed Care – PPO | Admitting: Family

## 2020-01-20 ENCOUNTER — Encounter: Payer: Self-pay | Admitting: Family

## 2020-02-27 ENCOUNTER — Other Ambulatory Visit: Payer: Self-pay

## 2020-02-27 ENCOUNTER — Ambulatory Visit: Payer: BC Managed Care – PPO | Admitting: Family

## 2020-02-27 ENCOUNTER — Encounter: Payer: Self-pay | Admitting: Family

## 2020-02-27 VITALS — BP 125/83 | HR 89 | Temp 98.2°F | Ht 63.0 in | Wt 255.4 lb

## 2020-02-27 DIAGNOSIS — E282 Polycystic ovarian syndrome: Secondary | ICD-10-CM

## 2020-02-27 DIAGNOSIS — I10 Essential (primary) hypertension: Secondary | ICD-10-CM

## 2020-02-27 DIAGNOSIS — F411 Generalized anxiety disorder: Secondary | ICD-10-CM

## 2020-02-27 DIAGNOSIS — E039 Hypothyroidism, unspecified: Secondary | ICD-10-CM | POA: Diagnosis not present

## 2020-02-27 DIAGNOSIS — K2100 Gastro-esophageal reflux disease with esophagitis, without bleeding: Secondary | ICD-10-CM

## 2020-02-27 DIAGNOSIS — Z6841 Body Mass Index (BMI) 40.0 and over, adult: Secondary | ICD-10-CM

## 2020-02-27 DIAGNOSIS — E785 Hyperlipidemia, unspecified: Secondary | ICD-10-CM

## 2020-02-27 DIAGNOSIS — F331 Major depressive disorder, recurrent, moderate: Secondary | ICD-10-CM

## 2020-02-27 MED ORDER — HYDROCHLOROTHIAZIDE 25 MG PO TABS: 25.0000 mg | ORAL_TABLET | Freq: Every day | ORAL | 3 refills | Status: DC

## 2020-02-27 MED ORDER — BUPROPION HCL ER (SR) 150 MG PO TB12: 150.0000 mg | ORAL_TABLET | Freq: Two times a day (BID) | ORAL | 1 refills | Status: DC

## 2020-02-27 NOTE — Patient Instructions (Signed)
[email or web address].[email or web address].Gov">  Generalized Anxiety Disorder, Adult Generalized anxiety disorder (GAD) is a mental health condition. Unlike normal worries, anxiety related to GAD is not triggered by a specific event. These worries do not fade or get better with time. GAD interferes with relationships, work, and school. GAD symptoms can vary from mild to severe. People with severe GAD can have intense waves of anxiety with physical symptoms that are similar to panic attacks. What are the causes? The exact cause of GAD is not known, but the following are believed to have an impact:  Differences in natural brain chemicals.  Genes passed down from parents to children.  Differences in the way threats are perceived.  Development during childhood.  Personality. What increases the risk? The following factors may make you more likely to develop this condition:  Being female.  Having a family history of anxiety disorders.  Being very shy.  Experiencing very stressful life events, such as the death of a loved one.  Having a very stressful family environment. What are the signs or symptoms? People with GAD often worry excessively about many things in their lives, such as their health and family. Symptoms may also include:  Mental and emotional symptoms: ? Worrying excessively about natural disasters. ? Fear of being late. ? Difficulty concentrating. ? Fears that others are judging your performance.  Physical symptoms: ? Fatigue. ? Headaches, muscle tension, muscle twitches, trembling, or feeling shaky. ? Feeling like your heart is pounding or beating very fast. ? Feeling out of breath or like you cannot take a deep breath. ? Having trouble falling asleep or staying asleep, or experiencing restlessness. ? Sweating. ? Nausea, diarrhea, or irritable bowel syndrome (IBS).  Behavioral symptoms: ? Experiencing erratic moods or irritability. ? Avoidance of new situations. ? Avoidance of  people. ? Extreme difficulty making decisions. How is this diagnosed? This condition is diagnosed based on your symptoms and medical history. You will also have a physical exam. Your health care provider may perform tests to rule out other possible causes of your symptoms. To be diagnosed with GAD, a person must have anxiety that:  Is out of his or her control.  Affects several different aspects of his or her life, such as work and relationships.  Causes distress that makes him or her unable to take part in normal activities.  Includes at least three symptoms of GAD, such as restlessness, fatigue, trouble concentrating, irritability, muscle tension, or sleep problems. Before your health care provider can confirm a diagnosis of GAD, these symptoms must be present more days than they are not, and they must last for 6 months or longer. How is this treated? This condition may be treated with:  Medicine. Antidepressant medicine is usually prescribed for long-term daily control. Anti-anxiety medicines may be added in severe cases, especially when panic attacks occur.  Talk therapy (psychotherapy). Certain types of talk therapy can be helpful in treating GAD by providing support, education, and guidance. Options include: ? Cognitive behavioral therapy (CBT). People learn coping skills and self-calming techniques to ease their physical symptoms. They learn to identify unrealistic thoughts and behaviors and to replace them with more appropriate thoughts and behaviors. ? Acceptance and commitment therapy (ACT). This treatment teaches people how to be mindful as a way to cope with unwanted thoughts and feelings. ? Biofeedback. This process trains you to manage your body's response (physiological response) through breathing techniques and relaxation methods. You will work with a therapist while machines are used to monitor your physical   symptoms.  Stress management techniques. These include yoga,  meditation, and exercise. A mental health specialist can help determine which treatment is best for you. Some people see improvement with one type of therapy. However, other people require a combination of therapies.   Follow these instructions at home: Lifestyle  Maintain a consistent routine and schedule.  Anticipate stressful situations. Create a plan, and allow extra time to work with your plan.  Practice stress management or self-calming techniques that you have learned from your therapist or your health care provider. General instructions  Take over-the-counter and prescription medicines only as told by your health care provider.  Understand that you are likely to have setbacks. Accept this and be kind to yourself as you persist to take better care of yourself.  Recognize and accept your accomplishments, even if you judge them as small.  Keep all follow-up visits as told by your health care provider. This is important. Contact a health care provider if:  Your symptoms do not get better.  Your symptoms get worse.  You have signs of depression, such as: ? A persistently sad or irritable mood. ? Loss of enjoyment in activities that used to bring you joy. ? Change in weight or eating. ? Changes in sleeping habits. ? Avoiding friends or family members. ? Loss of energy for normal tasks. ? Feelings of guilt or worthlessness. Get help right away if:  You have serious thoughts about hurting yourself or others. If you ever feel like you may hurt yourself or others, or have thoughts about taking your own life, get help right away. Go to your nearest emergency department or:  Call your local emergency services (911 in the [place]  Call a suicide crisis helpline, such as the National Suicide Prevention Lifeline at 1-800-273-8255. This is open 24 hours a day in the [place]  Text the Crisis Text Line at 741741 (in the [place] Summary  Generalized anxiety disorder (GAD) is a mental  health condition that involves worry that is not triggered by a specific event.  People with GAD often worry excessively about many things in their lives, such as their health and family.  GAD may cause symptoms such as restlessness, trouble concentrating, sleep problems, frequent sweating, nausea, diarrhea, headaches, and trembling or muscle twitching.  A mental health specialist can help determine which treatment is best for you. Some people see improvement with one type of therapy. However, other people require a combination of therapies. This information is not intended to replace advice given to you by your health care provider. Make sure you discuss any questions you have with your health care provider. Document Revised: 10/30/2018 Document Reviewed: 10/30/2018 Elsevier Patient Education  2021 Elsevier Inc.  

## 2020-02-27 NOTE — Progress Notes (Signed)
Subjective:    Patient ID: Diana Avery, female    DOB: Aug 24, 1990, 30 y.o.   MRN: [id number]  Chief Complaint  Patient presents with  . Hypothyroidism  . Hypertension    Wants to go back on HCTZ   Pt presents to the office today for chronic follow up. She is followed by fertility specialists.  She was also started on metformin for her PCOS. However, she states she has not been able to tolerate.   She also states her HCTZ was stopped and started on labetalol 100mg  . She states she has stopped this and restarted her HCTZ because of fluid retention. I recommend her discuss this with her Fertility specialists.  Hypertension This is a chronic problem. The current episode started more than 1 year ago. The problem has been resolved since onset. The problem is controlled. Associated symptoms include anxiety and peripheral edema. Pertinent negatives include no malaise/fatigue or shortness of breath. Risk factors for coronary artery disease include dyslipidemia, obesity and sedentary lifestyle. The current treatment provides moderate improvement. There is no history of CVA or heart failure. Identifiable causes of hypertension include a thyroid problem.  Thyroid Problem Presents for follow-up visit. Symptoms include anxiety, fatigue, hoarse voice and weight gain. Patient reports no depressed mood. The symptoms have been stable. Her past medical history is significant for hyperlipidemia. There is no history of heart failure.  Depression        This is a chronic problem.  The current episode started more than 1 year ago.   The onset quality is gradual.   The problem occurs intermittently.  Associated symptoms include fatigue, irritable, restlessness, decreased interest and sad.  Associated symptoms include no helplessness and no hopelessness.  Past treatments include nothing.  Past medical history includes hypothyroidism, thyroid problem and anxiety.   Anxiety Presents for follow-up visit. Symptoms  include excessive worry, irritability, nervous/anxious behavior and restlessness. Patient reports no depressed mood or shortness of breath. Symptoms occur most days. The severity of symptoms is moderate. The quality of sleep is good.    Hyperlipidemia This is a chronic problem. The current episode started more than 1 year ago. The problem is uncontrolled. Recent lipid tests were reviewed and are high. Exacerbating diseases include hypothyroidism. Pertinent negatives include no shortness of breath. She is currently on no antihyperlipidemic treatment. The current treatment provides no improvement of lipids. Risk factors for coronary artery disease include dyslipidemia, family history and hypertension.      Review of Systems  Constitutional: Positive for fatigue, irritability and weight gain. Negative for malaise/fatigue.  HENT: Positive for hoarse voice.   Respiratory: Negative for shortness of breath.   Psychiatric/Behavioral: Positive for depression. The patient is nervous/anxious.   All other systems reviewed and are negative.      Objective:   Physical Exam Vitals reviewed.  Constitutional:      General: She is irritable. She is not in acute distress.    Appearance: She is well-developed and well-nourished. She is obese.  HENT:     Head: Normocephalic and atraumatic.     Right Ear: Tympanic membrane normal.     Left Ear: Tympanic membrane normal.     Mouth/Throat:     Mouth: Oropharynx is clear and moist.  Eyes:     Pupils: Pupils are equal, round, and reactive to light.  Neck:     Thyroid: No thyromegaly.  Cardiovascular:     Rate and Rhythm: Normal rate and regular rhythm.  Pulses: Intact distal pulses.     Heart sounds: Normal heart sounds. No murmur heard.   Pulmonary:     Effort: Pulmonary effort is normal. No respiratory distress.     Breath sounds: Normal breath sounds. No wheezing.  Abdominal:     General: Bowel sounds are normal. There is no distension.      Palpations: Abdomen is soft.     Tenderness: There is no abdominal tenderness.  Musculoskeletal:        General: No tenderness or edema. Normal range of motion.     Cervical back: Normal range of motion and neck supple.  Skin:    General: Skin is warm and dry.  Neurological:     Mental Status: She is alert and oriented to person, place, and time.     Cranial Nerves: No cranial nerve deficit.     Deep Tendon Reflexes: Reflexes are normal and symmetric.  Psychiatric:        Mood and Affect: Mood and affect normal. Affect is tearful.        Behavior: Behavior normal.        Thought Content: Thought content normal.        Judgment: Judgment normal.     Comments: Crying        BP 125/83   Pulse 89   Temp 98.2 F (36.8 C) (Temporal)   Ht 5\' 3"  (1.6 m)   Wt 255 lb 6.4 oz (115.8 kg)   SpO2 100%   BMI 45.24 kg/m      Assessment & Plan:  Diana Avery comes in today with chief complaint of Hypothyroidism and Hypertension (Wants to go back on HCTZ)   Diagnosis and orders addressed:  1. Essential hypertension Will reorder HCTZ 25 mg She reports she no longer wants to get pregnant  -Dash diet information given -Exercise encouraged - Stress Management  -Continue current meds - hydrochlorothiazide (HYDRODIURIL) 25 MG tablet; Take 1 tablet (25 mg total) by mouth daily.  Dispense: 90 tablet; Refill: 3  2. Gastroesophageal reflux disease with esophagitis, unspecified whether hemorrhage  3. PCOS (polycystic ovarian syndrome) - Amb Ref to Medical Weight Management  4. Hypothyroidism, unspecified type - Amb Ref to Medical Weight Management - TSH  5. Morbid obesity with BMI of 45.0-49.9, adult (HCC)  - Amb Ref to Medical Weight Management - buPROPion (WELLBUTRIN SR) 150 MG 12 hr tablet; Take 1 tablet (150 mg total) by mouth 2 (two) times daily.  Dispense: 180 tablet; Refill: 1  6. Hyperlipidemia, unspecified hyperlipidemia type   7. GAD (generalized anxiety  disorder) Will add Wellbutrin 150 mg  Stress management  - buPROPion (WELLBUTRIN SR) 150 MG 12 hr tablet; Take 1 tablet (150 mg total) by mouth 2 (two) times daily.  Dispense: 180 tablet; Refill: 1  8. Moderate episode of recurrent major depressive disorder (HCC) - buPROPion (WELLBUTRIN SR) 150 MG 12 hr tablet; Take 1 tablet (150 mg total) by mouth 2 (two) times daily.  Dispense: 180 tablet; Refill: 1   Labs pending Health Maintenance reviewed Diet and exercise encouraged  Follow up plan: 4 weeks to recheck GAD and Depression    Evelina Dun, FNP

## 2020-02-28 LAB — TSH: TSH: 1.65 u[IU]/mL (ref 0.450–4.500)

## 2020-03-26 ENCOUNTER — Encounter: Payer: Self-pay | Admitting: Family

## 2020-03-26 ENCOUNTER — Ambulatory Visit: Payer: BC Managed Care – PPO | Admitting: Family

## 2020-03-26 ENCOUNTER — Other Ambulatory Visit: Payer: Self-pay

## 2020-03-26 VITALS — BP 118/75 | HR 90 | Temp 97.1°F | Ht 63.0 in | Wt 257.0 lb

## 2020-03-26 DIAGNOSIS — F411 Generalized anxiety disorder: Secondary | ICD-10-CM | POA: Diagnosis not present

## 2020-03-26 DIAGNOSIS — F331 Major depressive disorder, recurrent, moderate: Secondary | ICD-10-CM | POA: Diagnosis not present

## 2020-03-26 MED ORDER — BUSPIRONE HCL 5 MG PO TABS: 5.0000 mg | ORAL_TABLET | Freq: Three times a day (TID) | ORAL | 3 refills | Status: DC | PRN

## 2020-03-26 MED ORDER — BUPROPION HCL ER (XL) 300 MG PO TB24: 300.0000 mg | ORAL_TABLET | Freq: Every day | ORAL | 3 refills | Status: DC

## 2020-03-26 NOTE — Patient Instructions (Signed)
Major Depressive Disorder, Adult Major depressive disorder (MDD) is a mental health condition. It may also be called clinical depression or unipolar depression. MDD causes symptoms of sadness, hopelessness, and loss of interest in things. These symptoms last most of the day, almost every day, for 2 weeks. MDD can also cause physical symptoms. It can interfere with relationships and with everyday activities, such as work, school, and activities that are usually pleasant. MDD may be mild, moderate, or severe. It may be single-episode MDD, which happens once, or recurrent MDD, which may occur multiple times. What are the causes? The exact cause of this condition is not known. MDD is most likely caused by a combination of things, which may include:  Your personality traits.  Learned or conditioned behaviors or thoughts or feelings that reinforce negativity.  Any alcohol or substance misuse.  Long-term (chronic) physical or mental health illness.  Going through a traumatic experience or major life changes. What increases the risk? The following factors may make someone more likely to develop MDD:  A family history of depression.  Being a woman.  Troubled family relationships.  Abnormally low levels of certain brain chemicals.  Traumatic or painful events in childhood, especially abuse or loss of a parent.  A lot of stress from life experiences, such as poor living conditions or discrimination.  Chronic physical illness or other mental health disorders. What are the signs or symptoms? The main symptoms of MDD usually include:  Constant depressed or irritable mood.  A loss of interest in things and activities. Other symptoms include:  Sleeping or eating too much or too little.  Unexplained weight gain or weight loss.  Tiredness or low energy.  Being agitated, restless, or weak.  Feeling hopeless, worthless, or guilty.  Trouble thinking clearly or making  decisions.  Thoughts of suicide or thoughts of harming others.  Isolating oneself or avoiding other people or activities.  Trouble completing tasks, work, or any normal obligations. Severe symptoms of this condition may include:  Psychotic depression.This may include false beliefs, or delusions. It may also include seeing, hearing, tasting, smelling, or feeling things that are not real (hallucinations).  Chronic depression or persistent depressive disorder. This is low-level depression that lasts for at least 2 years.  Melancholic depression, or feeling extremely sad and hopeless.  Catatonic depression, which includes trouble speaking and trouble moving. How is this diagnosed? This condition may be diagnosed based on:  Your symptoms.  Your medical and mental health history. You may be asked questions about your lifestyle, including any drug and alcohol use.  A physical exam.  Blood tests to rule out other conditions. MDD is confirmed if you have the following symptoms most of the day, nearly every day, in a 2-week period:  Either a depressed mood or loss of interest.  At least four other MDD symptoms. How is this treated? This condition is usually treated by mental health professionals, such as psychologists, psychiatrists, and clinical social workers. You may need more than one type of treatment. Treatment may include:  Psychotherapy, also called talk therapy or counseling. Types of psychotherapy include: ? Cognitive behavioral therapy (CBT). This teaches you to recognize unhealthy feelings, thoughts, and behaviors, and replace them with positive thoughts and actions. ? Interpersonal therapy (IPT). This helps you to improve the way you communicate with others or relate to them. ? Family therapy. This treatment includes members of your family.  Medicines to treat anxiety and depression. These medicines help to balance the brain chemicals   that affect your emotions.  Lifestyle  changes. You may be asked to: ? Limit alcohol use and avoid drug use. ? Get regular exercise. ? Get plenty of sleep. ? Make healthy eating choices. ? Spend more time outdoors.  Brain stimulation. This may be done if symptoms are very severe and other treatments have not worked. Examples of this treatment are electroconvulsive therapy and transcranial magnetic stimulation. Follow these instructions at home: Activity  Exercise regularly and spend time outdoors.  Find activities that you enjoy doing, and make time to do them.  Find healthy ways to manage stress, such as: ? Meditation or deep breathing. ? Spending time in nature. ? Journaling.  Return to your normal activities as told by your health care provider. Ask your health care provider what activities are safe for you. Alcohol and drug use  If you drink alcohol: ? Limit how much you use to:  0-1 drink a day for women who are not pregnant.  0-2 drinks a day for men. ? Be aware of how much alcohol is in your drink. In the [place] one drink equals one 12 oz bottle of beer (355 mL), one 5 oz glass of wine (148 mL), or one 1 oz glass of hard liquor (44 mL). ? Discuss your alcohol use with your health care provider. Alcohol can affect any antidepressant medicines you are taking.  Discuss any drug use with your health care provider. General instructions  Take over-the-counter and prescription medicines only as told by your health care provider.  Eat a healthy diet and get plenty of sleep.  Consider joining a support group. Your health care provider may be able to recommend one.  Keep all follow-up visits as told by your health care provider. This is important.   Where to find more information  National Alliance on Mental Illness: www.nami.org  [place] National Institute of Mental Health: www.nimh.nih.gov Contact a health care provider if:  Your symptoms get worse.  You develop new symptoms. Get help right away if:  You  self-harm.  You have serious thoughts about hurting yourself or others.  You hallucinate. If you ever feel like you may hurt yourself or others, or have thoughts about taking your own life, get help right away. Go to your nearest emergency department or:  Call your local emergency services (911 in the [place]  Call a suicide crisis helpline, such as the National Suicide Prevention Lifeline at 1-800-273-8255. This is open 24 hours a day in the [place]  Text the Crisis Text Line at 741741 (in the [place] Summary  Major depressive disorder (MDD) is a mental health condition. MDD causes symptoms of sadness, hopelessness, and loss of interest in things. These symptoms last most of the day, almost every day, for 2 weeks.  The symptoms of MDD can interfere with relationships and with everyday activities.  Treatments and support are available for people who develop MDD. You may need more than one type of treatment.  Get help right away if you have serious thoughts about hurting yourself or others. This information is not intended to replace advice given to you by your health care provider. Make sure you discuss any questions you have with your health care provider. Document Revised: 12/21/2018 Document Reviewed: 12/21/2018 Elsevier Patient Education  2021 [employer]  

## 2020-03-26 NOTE — Progress Notes (Signed)
Subjective:    Patient ID: Diana Avery, female    DOB: 03-23-1990, 30 y.o.   MRN: [id number]  Chief Complaint  Patient presents with  . Depression   PT presents to the office today to recheck depression. We added Wellbutrin 150 mg. She reports her depression has improved, but not gone. Continues to have moments of irritable.  Depression        This is a chronic problem.  The current episode started more than 1 year ago.   The onset quality is gradual.   The problem occurs intermittently.  Associated symptoms include irritable, restlessness, decreased interest and sad.  Associated symptoms include no helplessness and no hopelessness.  Past medical history includes anxiety.   Anxiety Presents for follow-up visit. Symptoms include depressed mood, irritability, nervous/anxious behavior and restlessness. Symptoms occur occasionally.        Review of Systems  Constitutional: Positive for irritability.  Psychiatric/Behavioral: Positive for depression. The patient is nervous/anxious.   All other systems reviewed and are negative.      Objective:   Physical Exam Vitals reviewed.  Constitutional:      General: She is irritable. She is not in acute distress.    Appearance: She is well-developed and well-nourished. She is obese.  HENT:     Head: Normocephalic and atraumatic.     Right Ear: Tympanic membrane normal.     Left Ear: Tympanic membrane normal.     Mouth/Throat:     Mouth: Oropharynx is clear and moist.  Eyes:     Pupils: Pupils are equal, round, and reactive to light.  Neck:     Thyroid: No thyromegaly.  Cardiovascular:     Rate and Rhythm: Normal rate and regular rhythm.     Pulses: Intact distal pulses.     Heart sounds: Normal heart sounds. No murmur heard.   Pulmonary:     Effort: Pulmonary effort is normal. No respiratory distress.     Breath sounds: Normal breath sounds. No wheezing.  Abdominal:     General: Bowel sounds are normal. There is no  distension.     Palpations: Abdomen is soft.     Tenderness: There is no abdominal tenderness.  Musculoskeletal:        General: No tenderness or edema. Normal range of motion.     Cervical back: Normal range of motion and neck supple.  Skin:    General: Skin is warm and dry.  Neurological:     Mental Status: She is alert and oriented to person, place, and time.     Cranial Nerves: No cranial nerve deficit.     Deep Tendon Reflexes: Reflexes are normal and symmetric.  Psychiatric:        Mood and Affect: Mood and affect normal.        Behavior: Behavior normal.        Thought Content: Thought content normal.        Judgment: Judgment normal.       BP 118/75   Pulse 90   Temp (!) 97.1 F (36.2 C) (Temporal)   Ht 5\' 3"  (1.6 m)   Wt 257 lb (116.6 kg)   BMI 45.53 kg/m      Assessment & Plan:  Diana Avery comes in today with chief complaint of Depression   Diagnosis and orders addressed:  1. GAD (generalized anxiety disorder) - buPROPion (WELLBUTRIN XL) 300 MG 24 hr tablet; Take 1 tablet (300 mg total) by mouth daily.  Dispense: 90 tablet; Refill: 3 - busPIRone (BUSPAR) 5 MG tablet; Take 1 tablet (5 mg total) by mouth 3 (three) times daily as needed.  Dispense: 90 tablet; Refill: 3  2. Moderate episode of recurrent major depressive disorder (HCC) - buPROPion (WELLBUTRIN XL) 300 MG 24 hr tablet; Take 1 tablet (300 mg total) by mouth daily.  Dispense: 90 tablet; Refill: 3 - busPIRone (BUSPAR) 5 MG tablet; Take 1 tablet (5 mg total) by mouth 3 (three) times daily as needed.  Dispense: 90 tablet; Refill: 3   Will increase Wellbutrin to  300 mg from 150 mg  Will add Buspar 5 mg TID prn  Stress management  RTO in 1 month rechecked Checked on referral to Weight loss clinic. Referral still pending at this time.   Evelina Dun, FNP

## 2020-04-15 ENCOUNTER — Encounter: Payer: Self-pay | Admitting: *Deleted

## 2020-04-17 ENCOUNTER — Other Ambulatory Visit: Payer: Self-pay | Admitting: Family

## 2020-04-17 DIAGNOSIS — F411 Generalized anxiety disorder: Secondary | ICD-10-CM

## 2020-04-17 DIAGNOSIS — F331 Major depressive disorder, recurrent, moderate: Secondary | ICD-10-CM

## 2020-04-23 ENCOUNTER — Ambulatory Visit: Payer: BC Managed Care – PPO | Admitting: Family

## 2020-04-29 ENCOUNTER — Ambulatory Visit: Payer: BC Managed Care – PPO | Admitting: Family

## 2020-05-04 ENCOUNTER — Ambulatory Visit: Payer: BC Managed Care – PPO | Admitting: Family

## 2020-05-14 ENCOUNTER — Encounter: Payer: Self-pay | Admitting: Family

## 2020-05-14 ENCOUNTER — Telehealth (INDEPENDENT_AMBULATORY_CARE_PROVIDER_SITE_OTHER): Payer: BC Managed Care – PPO | Admitting: Family

## 2020-05-14 DIAGNOSIS — F331 Major depressive disorder, recurrent, moderate: Secondary | ICD-10-CM | POA: Diagnosis not present

## 2020-05-14 DIAGNOSIS — F411 Generalized anxiety disorder: Secondary | ICD-10-CM

## 2020-05-14 NOTE — Progress Notes (Signed)
   Virtual Visit  Note Due to COVID-19 pandemic this visit was conducted virtually. This visit type was conducted due to national recommendations for restrictions regarding the COVID-19 Pandemic (e.g. social distancing, sheltering in place) in an effort to limit this patient's exposure and mitigate transmission in our community. All issues noted in this document were discussed and addressed.  A physical exam was not performed with this format.  I connected with Diana Avery on 05/14/20 at 12:09 pm  by video and verified that I am speaking with the correct person using two identifiers. Diana Avery is currently located at work and no one is currently with her during visit. The provider, Evelina Dun, FNP is located in their office at time of visit.  I discussed the limitations, risks, security and privacy concerns of performing an evaluation and management service by video and the availability of in person appointments. I also discussed with the patient that there may be a patient responsible charge related to this service. The patient expressed understanding and agreed to proceed.   History and Present Illness:  Pt presents to the office today to recheck GAD and Depression. She was seen on 03/26/20 and we increased her Wellbutrin to 300 mg from 150 mg and add Buspar 5 mg as needed. She reports she has not had to use the Buspar except twice because she feels like her anxiety and depression.  Anxiety Presents for follow-up visit. Symptoms include depressed mood, excessive worry, irritability, nervous/anxious behavior and restlessness. Symptoms occur occasionally. The severity of symptoms is moderate.    Depression        This is a chronic problem.  The current episode started more than 1 year ago.   The problem occurs intermittently.  Associated symptoms include irritable, restlessness and sad.  Associated symptoms include no helplessness and no hopelessness.  Past medical history  includes anxiety.       Review of Systems  Constitutional: Positive for irritability.  Psychiatric/Behavioral: Positive for depression. The patient is nervous/anxious.   All other systems reviewed and are negative.    Observations/Objective: No SOB or distress noted, pt looks well and healthy. Smiling.   Assessment and Plan: 1. Moderate episode of recurrent major depressive disorder ([place])  2. GAD (generalized anxiety disorder)  Well controlled at this time Continue the Wellbutrin 300 mg and use Buspar as needed  Stress management  RTO in 6 months     I discussed the assessment and treatment plan with the patient. The patient was provided an opportunity to ask questions and all were answered. The patient agreed with the plan and demonstrated an understanding of the instructions.   The patient was advised to call back or seek an in-person evaluation if the symptoms worsen or if the condition fails to improve as anticipated.  The above assessment and management plan was discussed with the patient. The patient verbalized understanding of and has agreed to the management plan. Patient is aware to call the clinic if symptoms persist or worsen. Patient is aware when to return to the clinic for a follow-up visit. Patient educated on when it is appropriate to go to the emergency department.   Time call ended:  12:20 pm   I provided 11 minutes of   face-to-face time during this encounter.    Evelina Dun, FNP

## 2020-06-08 ENCOUNTER — Ambulatory Visit (INDEPENDENT_AMBULATORY_CARE_PROVIDER_SITE_OTHER): Payer: BC Managed Care – PPO | Admitting: Family Medicine

## 2020-06-08 ENCOUNTER — Encounter (INDEPENDENT_AMBULATORY_CARE_PROVIDER_SITE_OTHER): Payer: Self-pay | Admitting: Family Medicine

## 2020-06-08 ENCOUNTER — Other Ambulatory Visit: Payer: Self-pay

## 2020-06-08 VITALS — BP 106/72 | HR 67 | Temp 97.9°F | Ht 63.0 in | Wt 250.0 lb

## 2020-06-08 DIAGNOSIS — E039 Hypothyroidism, unspecified: Secondary | ICD-10-CM | POA: Diagnosis not present

## 2020-06-08 DIAGNOSIS — Z6841 Body Mass Index (BMI) 40.0 and over, adult: Secondary | ICD-10-CM

## 2020-06-08 DIAGNOSIS — I10 Essential (primary) hypertension: Secondary | ICD-10-CM

## 2020-06-08 DIAGNOSIS — E559 Vitamin D deficiency, unspecified: Secondary | ICD-10-CM

## 2020-06-08 DIAGNOSIS — R0683 Snoring: Secondary | ICD-10-CM

## 2020-06-08 DIAGNOSIS — R7301 Impaired fasting glucose: Secondary | ICD-10-CM

## 2020-06-08 DIAGNOSIS — Z8616 Personal history of COVID-19: Secondary | ICD-10-CM | POA: Insufficient documentation

## 2020-06-08 DIAGNOSIS — E7849 Other hyperlipidemia: Secondary | ICD-10-CM

## 2020-06-08 DIAGNOSIS — R0602 Shortness of breath: Secondary | ICD-10-CM

## 2020-06-08 DIAGNOSIS — G43809 Other migraine, not intractable, without status migrainosus: Secondary | ICD-10-CM

## 2020-06-08 DIAGNOSIS — F339 Major depressive disorder, recurrent, unspecified: Secondary | ICD-10-CM

## 2020-06-08 DIAGNOSIS — R5383 Other fatigue: Secondary | ICD-10-CM | POA: Diagnosis not present

## 2020-06-08 DIAGNOSIS — F411 Generalized anxiety disorder: Secondary | ICD-10-CM

## 2020-06-08 DIAGNOSIS — E282 Polycystic ovarian syndrome: Secondary | ICD-10-CM

## 2020-06-08 DIAGNOSIS — Z9189 Other specified personal risk factors, not elsewhere classified: Secondary | ICD-10-CM | POA: Diagnosis not present

## 2020-06-08 DIAGNOSIS — F5081 Binge eating disorder: Secondary | ICD-10-CM

## 2020-06-08 DIAGNOSIS — Z0289 Encounter for other administrative examinations: Secondary | ICD-10-CM

## 2020-06-08 MED ORDER — OZEMPIC (0.25 OR 0.5 MG/DOSE) 2 MG/1.5ML ~~LOC~~ SOPN: 0.2500 mg | PEN_INJECTOR | SUBCUTANEOUS | 0 refills | Status: DC

## 2020-06-08 NOTE — Progress Notes (Signed)
Dear Diana Dun, FNP,   Thank you for referring Diana Avery to our clinic. The following note includes my evaluation and treatment recommendations.  Chief Complaint:   OBESITY Diana Avery (MR# [id number]) is a 30 y.o. female who presents for evaluation and treatment of obesity and related comorbidities. Current BMI is Body mass index is 44.29 kg/m. Diana Avery has been struggling with her weight for many years and has been unsuccessful in either losing weight, maintaining weight loss, or reaching her healthy weight goal.  Diana Avery is currently in the action stage of change and ready to dedicate time achieving and maintaining a healthier weight. Diana Avery is interested in becoming our patient and working on intensive lifestyle modifications including (but not limited to) diet and exercise for weight loss.  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  She lives with her husband and step son ([id number]).  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  She has been on phentermine in the past, which worked well.  She was also on Ozempic a year or two ago and says it worked well.  She has no issues with night eating.  AOM are covered.  Diana Avery provided the following food recall today:  Breakfast:  Donuts/fast food/skips. Lunch (1 pm):  Brought lunch (stew - beef). Dinner (6-7 pm): Hard to drink water.  Drinks 3-4 diet sodas per day. History of IF (window 1-8 pm).  Diana Avery's habits were reviewed today and are as follows: Her family eats meals together, she thinks her family will eat healthier with her, she struggles with family and or coworkers weight loss sabotage, her desired weight loss is 80 pounds, she has been heavy most of her life, she started gaining weight at age 34, her heaviest weight ever was 299 pounds, she craves [employer] nuggets and fries, [place] food, broccoli,  cauliflower, grilled chicken, steak, shrimp, [employer] salad with [place] hot sauce, she snacks frequently in the evenings, she skips breakfast frequently, she is frequently drinking liquids with calories, she frequently makes poor food choices, she has problems with excessive hunger, she frequently eats larger portions than normal, she has binge eating behaviors and she struggles with emotional eating.  Depression Screen Diana Avery's Food and Mood (modified PHQ-9) score was 18.  Depression screen PHQ 2/9 03/26/2020  Decreased Interest 0  Down, Depressed, Hopeless 0  PHQ - 2 Score 0  Altered sleeping -  Tired, decreased energy -  Change in appetite -  Feeling bad or failure about yourself  -  Trouble concentrating -  Moving slowly or fidgety/restless -  Suicidal thoughts -  PHQ-9 Score -  Difficult doing work/chores -   Assessment/Plan:   Orders Placed This Encounter  Procedures  . EKG 12-Lead   Medications Discontinued During This Encounter  Medication Reason  . busPIRone (BUSPAR) 5 MG tablet   . topiramate (TOPAMAX) 50 MG tablet   . UNABLE TO FIND     1. Other fatigue Diana Avery denies daytime somnolence and reports waking up still tired. Patent has a history of symptoms of morning fatigue and snoring. Diana Avery generally gets 3 or 4 hours of sleep per night, and states that she has poor sleep quality. Snoring is present. Apneic episodes are not present. Epworth Sleepiness Score is 4.  [name] does feel that her weight is causing her energy to be lower than it should be. Fatigue  may be related to obesity, depression or many other causes. Labs will be ordered, and in the meanwhile, Diana Avery will focus on self care including making healthy food choices, increasing physical activity and focusing on stress reduction.  - EKG 12-Lead - Anemia panel - CBC with Differential/Platelet  2. SOB (shortness of breath) on exertion Diana Avery notes increasing shortness of breath with exercising and seems  to be worsening over time with weight gain. She notes getting out of breath sooner with activity than she used to. This has gotten worse recently. Diana Avery denies shortness of breath at rest or orthopnea.  Diana Avery does feel that she gets out of breath more easily that she used to when she exercises. Diana Avery's shortness of breath appears to be obesity related and exercise induced. She has agreed to work on weight loss and gradually increase exercise to treat her exercise induced shortness of breath. Will continue to monitor closely.  3. PCOS (polycystic ovarian syndrome) With infertility, recurrent miscarriage.   Plan:  She will continue to focus on protein-rich, low simple carbohydrate foods. We reviewed the importance of hydration, regular exercise for stress reduction, and restorative sleep.  Will check labs today.  Counseling . PCOS is a leading cause of menstrual irregularities and infertility. It is also associated with obesity, hirsutism (excessive hair growth on the face, chest, or back), and cardiovascular risk factors such as high cholesterol and insulin resistance. . Insulin resistance appears to play a central role.  . Women with PCOS have been shown to have impaired appetite-regulating hormones. . Metformin is one medication that can improve metabolic parameters.  . Women with polycystic ovary syndrome (PCOS) have an increased risk for cardiovascular disease (CVD) - European Journal of Preventive Cardiology.  - Comprehensive metabolic panel - Hemoglobin A1c - Insulin, random  4. Acquired hypothyroidism Medication: Synthroid 150 mcg daily.   Plan: Patient was instructed not to take MVM or iron within 4 hours of taking thyroid medications. This issue is managed by her PCP. We will continue to monitor alongside PCP as it relates to her weight loss journey.  Will check TSH and free T4 today.  Lab Results  Component Value Date   TSH 9.280 (H) 06/08/2020   - TSH - T4, free  5.  Essential hypertension At goal. Medications: HCTZ 25 mg daily.   Plan: Avoid buying foods that are: processed, frozen, or prepackaged to avoid excess salt. We will watch for signs of hypotension as she continues lifestyle modifications.  Will check labs today.  BP Readings from Last 3 Encounters:  06/08/20 106/72  03/26/20 118/75  02/27/20 125/83   Lab Results  Component Value Date   CREATININE 0.74 06/08/2020   - CBC with Differential/Platelet - Comprehensive metabolic panel  6. Other hyperlipidemia Course: Not at goal. Lipid-lowering medications: None.   Plan: Dietary changes: Increase soluble fiber, decrease simple carbohydrates, decrease saturated fat. Exercise changes: Moderate to vigorous-intensity aerobic activity 150 minutes per week or as tolerated. We will continue to monitor along with PCP/specialists as it pertains to her weight loss journey.  Will check lipid panel today, as per below.  Lab Results  Component Value Date   CHOL 176 06/08/2020   HDL 43 06/08/2020   LDLCALC 109 (H) 06/08/2020   TRIG 132 06/08/2020   CHOLHDL 4.1 06/08/2020   Lab Results  Component Value Date   ALT 72 (H) 06/08/2020   AST 28 06/08/2020   ALKPHOS 78 06/08/2020   BILITOT 0.3 06/08/2020   - Lipid panel  7. Vitamin D deficiency Diana Avery is taking a multivitamin.  Optimal goal > 50 ng/dL. Plan:  Will check vitamin D level today, as per below.  - VITAMIN D 25 Hydroxy (Vit-D Deficiency, Fractures)  8. Snores Diana Avery endorses snoring.  Epworth Sleepiness Score is 4.  9. Other migraine without status migrainosus, not intractable Increased with OCP.  History of Depo Provera, IUD, Nexplanon, OCPs, Nuvaring.  Husband is planning to get a vasectomy.  10. Impaired fasting glucose Will check labs today, as per below.  Will also start Ozempic 0.25 mg subcutaneously weekly.  - Comprehensive metabolic panel - Hemoglobin A1c - Insulin, random - Start Semaglutide,0.25 or 0.5MG /DOS, (OZEMPIC,  0.25 OR 0.5 MG/DOSE,) 2 MG/1.5ML SOPN; Inject 0.25 mg into the skin once a week.  Dispense: 1.5 mL; Refill: 0  11. History of COVID-19, 06/01/20 Kennadie recently had COVID on 06/01/2020.  12. Binge eating disorder Mauri states that appetite suppressant has helped in the past.  PDMP reviewed.  No concerns.  Brookelin meets binge eating disorder (BED) requirements, including: eating until feeling uncomfortably full, eating large amounts of food when not feeling physically full, eating alone because of feeling embarrassed by how much you are eating, feeling disgusted and depressed or guilty about binge eating.   People who binge eat feel as if they don't have control over how much they eat and have feelings of guilt or self-loathing after a binge eating episode. [employer] estimates that about 30 percent of adults with binge eating disorder also have a history of ADHD. The FDA has approved Vyvanse as a treatment option for both ADHD and binge eating. Vyvanse targets the brain's reward center by increasing the levels of dopamine and norepinephrine, the chemicals of the brain responsible for feelings of pleasure. Mindful eating is the recommended nutritional approach to treating BED.   13. Recurrent major depressive disorder, remission status unspecified (Diana Avery) Not at goal. Medication: Wellbutrin XL 300 mg daily.  PHQ-9 is 18.  Plan:  Discussed cues and consequences, how thoughts affect eating, model of thoughts, feelings, and behaviors, and strategies for change by focusing on the cue. Discussed cognitive distortions, coping thoughts, and how to change your thoughts. Behavior modification techniques were discussed today to help deal with emotional/non-hunger eating behaviors.  14. GAD (generalized anxiety disorder) Behavior modification techniques were discussed today to help Diana Avery deal with her anxiety.    15. At risk for diabetes mellitus Diana Avery was given diabetes prevention education and  counseling today of more than 9 minutes. She will continue to focus on protein-rich, low simple carbohydrate foods. We reviewed the importance of hydration, regular exercise for stress reduction, and restorative sleep.   16. Class 3 severe obesity with serious comorbidity and body mass index (BMI) of 40.0 to 44.9 in adult, unspecified obesity type (HCC)  Diana Avery is currently in the action stage of change and her goal is to continue with weight loss efforts. I recommend Diana Avery begin the structured treatment plan as follows:  She has agreed to the Category 3 Plan.  Exercise goals: As is.   Behavioral modification strategies: increasing lean protein intake, decreasing simple carbohydrates, increasing vegetables, increasing water intake, decreasing liquid calories, decreasing alcohol intake, decreasing sodium intake and increasing high fiber foods.  She was informed of the importance of frequent follow-up visits to maximize her success with intensive lifestyle modifications for her multiple health conditions. She was informed we would discuss her lab results at her next visit unless there is a critical issue that needs  to be addressed sooner. Minahil agreed to keep her next visit at the agreed upon time to discuss these results.  Objective:   Blood pressure 106/72, pulse 67, temperature 97.9 F (36.6 C), temperature source Oral, height 5\' 3"  (1.6 m), weight 250 lb (113.4 kg), last menstrual period 05/28/2020, SpO2 98 %, unknown if currently breastfeeding. Body mass index is 44.29 kg/m.  EKG: Normal sinus rhythm, rate 78 bpm.  Indirect Calorimeter completed today shows a VO2 of 307 and a REE of 2134.  Her calculated basal metabolic rate is [id number] thus her basal metabolic rate is better than expected.  General: Cooperative, alert, well developed, in no acute distress. HEENT: Conjunctivae and lids unremarkable. Cardiovascular: Regular rhythm.  Lungs: Normal work of breathing. Neurologic: No focal  deficits.   Lab Results  Component Value Date   CREATININE 0.71 10/24/2019   BUN 10 10/24/2019   NA 137 10/24/2019   K 4.0 10/24/2019   CL 98 10/24/2019   CO2 25 10/24/2019   Lab Results  Component Value Date   ALT 30 10/24/2019   AST 19 10/24/2019   ALKPHOS 73 10/24/2019   BILITOT 0.3 10/24/2019   Lab Results  Component Value Date   HGBA1C 5.5 11/30/2014   Lab Results  Component Value Date   TSH 1.650 02/27/2020   Lab Results  Component Value Date   CHOL 198 06/11/2018   HDL 52 06/11/2018   LDLCALC 115 (H) 06/11/2018   TRIG 154 (H) 06/11/2018   CHOLHDL 3.8 06/11/2018   Lab Results  Component Value Date   WBC 10.4 07/24/2019   HGB 12.2 07/24/2019   HCT 36.2 07/24/2019   MCV 81 07/24/2019   PLT 411 07/24/2019   Lab Results  Component Value Date   IRON 53 12/23/2012   FERRITIN 37.9 12/23/2012   Attestation Statements:   This is the patient's first visit at [facility]. The patient's NEW PATIENT PACKET was reviewed at length. Included in the packet: current and past health history, medications, allergies, ROS, gynecologic history (women only), surgical history, family history, social history, weight history, weight loss surgery history (for those that have had weight loss surgery), nutritional evaluation, mood and food questionnaire, PHQ9, Epworth questionnaire, sleep habits questionnaire, patient life and health improvement goals questionnaire. These will all be scanned into the patient's chart under media.   During the visit, I independently reviewed the patient's EKG, bioimpedance scale results, and indirect calorimeter results. I used this information to tailor a meal plan for the patient that will help her to lose weight and will improve her obesity-related conditions going forward. I performed a medically necessary appropriate examination and/or evaluation. I discussed the assessment and treatment plan with the patient. The patient was provided an  opportunity to ask questions and all were answered. The patient agreed with the plan and demonstrated an understanding of the instructions. Labs were ordered at this visit and will be reviewed at the next visit unless more critical results need to be addressed immediately. Clinical information was updated and documented in the EMR.   I, Water quality scientist, CMA, am acting as transcriptionist for Briscoe Deutscher, DO  I have reviewed the above documentation for accuracy and completeness, and I agree with the above. Briscoe Deutscher, DO

## 2020-06-09 LAB — COMPREHENSIVE METABOLIC PANEL
ALT: 72 IU/L — ABNORMAL HIGH (ref 0–32)
AST: 28 IU/L (ref 0–40)
Albumin/Globulin Ratio: 1.6 (ref 1.2–2.2)
Albumin: 4.4 g/dL (ref 3.8–4.8)
Alkaline Phosphatase: 78 IU/L (ref 44–121)
BUN/Creatinine Ratio: 12 (ref 9–23)
BUN: 9 mg/dL (ref 6–20)
Bilirubin Total: 0.3 mg/dL (ref 0.0–1.2)
CO2: 24 mmol/L (ref 20–29)
Calcium: 8.9 mg/dL (ref 8.7–10.2)
Chloride: 100 mmol/L (ref 96–106)
Creatinine, Ser: 0.74 mg/dL (ref 0.57–1.00)
Globulin, Total: 2.8 g/dL (ref 1.5–4.5)
Glucose: 90 mg/dL (ref 65–99)
Potassium: 3.8 mmol/L (ref 3.5–5.2)
Sodium: 139 mmol/L (ref 134–144)
Total Protein: 7.2 g/dL (ref 6.0–8.5)
eGFR: 111 mL/min/{1.73_m2} (ref >59)

## 2020-06-09 LAB — CBC WITH DIFFERENTIAL/PLATELET
Basophils Absolute: 0 10*3/uL (ref 0.0–0.2)
Basos: 0 %
EOS (ABSOLUTE): 0.2 10*3/uL (ref 0.0–0.4)
Eos: 3 %
Hemoglobin: 12.4 g/dL (ref 11.1–15.9)
Immature Grans (Abs): 0 10*3/uL (ref 0.0–0.1)
Immature Granulocytes: 1 %
Lymphocytes Absolute: 2 10*3/uL (ref 0.7–3.1)
Lymphs: 27 %
MCH: 25.4 pg — ABNORMAL LOW (ref 26.6–33.0)
MCHC: 31.8 g/dL (ref 31.5–35.7)
MCV: 80 fL (ref 79–97)
Monocytes Absolute: 0.6 10*3/uL (ref 0.1–0.9)
Monocytes: 8 %
Neutrophils Absolute: 4.5 10*3/uL (ref 1.4–7.0)
Neutrophils: 61 %
Platelets: 364 10*3/uL (ref 150–450)
RBC: 4.88 x10E6/uL (ref 3.77–5.28)
RDW: 14.6 % (ref 11.7–15.4)
WBC: 7.2 10*3/uL (ref 3.4–10.8)

## 2020-06-09 LAB — LIPID PANEL
Chol/HDL Ratio: 4.1 ratio (ref 0.0–4.4)
Cholesterol, Total: 176 mg/dL (ref 100–199)
HDL: 43 mg/dL (ref >39)
LDL Chol Calc (NIH): 109 mg/dL — ABNORMAL HIGH (ref 0–99)
Triglycerides: 132 mg/dL (ref 0–149)
VLDL Cholesterol Cal: 24 mg/dL (ref 5–40)

## 2020-06-09 LAB — ANEMIA PANEL
Ferritin: 15 ng/mL (ref 15–150)
Folate, Hemolysate: 368 ng/mL
Folate, RBC: 944 ng/mL (ref >498)
Hematocrit: 39 % (ref 34.0–46.6)
Iron Saturation: 7 % — CL (ref 15–55)
Iron: 31 ug/dL (ref 27–159)
Retic Ct Pct: 1.3 % (ref 0.6–2.6)
Total Iron Binding Capacity: 439 ug/dL (ref 250–450)
UIBC: 408 ug/dL (ref 131–425)
Vitamin B-12: 1223 pg/mL (ref 232–1245)

## 2020-06-09 LAB — TSH: TSH: 9.28 u[IU]/mL — ABNORMAL HIGH (ref 0.450–4.500)

## 2020-06-09 LAB — VITAMIN D 25 HYDROXY (VIT D DEFICIENCY, FRACTURES): Vit D, 25-Hydroxy: 25.2 ng/mL — ABNORMAL LOW (ref 30.0–100.0)

## 2020-06-09 LAB — T4, FREE: Free T4: 1.52 ng/dL (ref 0.82–1.77)

## 2020-06-09 LAB — HEMOGLOBIN A1C
Est. average glucose Bld gHb Est-mCnc: 117 mg/dL
Hgb A1c MFr Bld: 5.7 % — ABNORMAL HIGH (ref 4.8–5.6)

## 2020-06-09 LAB — INSULIN, RANDOM: INSULIN: 20 u[IU]/mL (ref 2.6–24.9)

## 2020-06-22 ENCOUNTER — Encounter (INDEPENDENT_AMBULATORY_CARE_PROVIDER_SITE_OTHER): Payer: Self-pay | Admitting: Family Medicine

## 2020-06-22 ENCOUNTER — Ambulatory Visit (INDEPENDENT_AMBULATORY_CARE_PROVIDER_SITE_OTHER): Payer: BC Managed Care – PPO | Admitting: Family Medicine

## 2020-06-22 ENCOUNTER — Other Ambulatory Visit: Payer: Self-pay

## 2020-06-22 VITALS — BP 109/73 | HR 82 | Temp 98.3°F | Ht 63.0 in | Wt 249.0 lb

## 2020-06-22 DIAGNOSIS — E559 Vitamin D deficiency, unspecified: Secondary | ICD-10-CM | POA: Diagnosis not present

## 2020-06-22 DIAGNOSIS — Z9189 Other specified personal risk factors, not elsewhere classified: Secondary | ICD-10-CM

## 2020-06-22 DIAGNOSIS — R7401 Elevation of levels of liver transaminase levels: Secondary | ICD-10-CM

## 2020-06-22 DIAGNOSIS — Z6841 Body Mass Index (BMI) 40.0 and over, adult: Secondary | ICD-10-CM

## 2020-06-22 DIAGNOSIS — R7303 Prediabetes: Secondary | ICD-10-CM

## 2020-06-22 DIAGNOSIS — F5081 Binge eating disorder: Secondary | ICD-10-CM

## 2020-06-22 DIAGNOSIS — R7989 Other specified abnormal findings of blood chemistry: Secondary | ICD-10-CM

## 2020-06-22 DIAGNOSIS — R790 Abnormal level of blood mineral: Secondary | ICD-10-CM

## 2020-06-22 NOTE — Progress Notes (Signed)
Chief Complaint:   OBESITY Diana Avery is here to discuss her progress with her obesity treatment plan along with follow-up of her obesity related diagnoses. See Medical Weight Management Flowsheet for bioelectrical impedance results.  Interim History: Diana Avery went to a work conference after her first visit.  She has had two doses of Ozempic thus far.  Nutrition Plan: the Category 3 Plan for 65% of the time. Activity:  Hiking/swimming/cardio/walking for 20-30 minutes 6-7 times per week. Anti-obesity medications: Ozempic 0.25 mg subcutaneously weekly. Reported side effects: None.  Assessment/Plan:   1. Low ferritin Diana Avery will start taking OTC ferrous sulfate every other day.  CBC Latest Ref Rng & Units 06/08/2020 07/24/2019 06/11/2018  WBC 3.4 - 10.8 x10E3/uL 7.2 10.4 12.2(H)  Hemoglobin 11.1 - 15.9 g/dL 12.4 12.2 13.7  Hematocrit 34.0 - 46.6 % 39.0 36.2 40.7  Platelets 150 - 450 x10E3/uL 364 411 557(H)   Lab Results  Component Value Date   IRON 31 06/08/2020   TIBC 439 06/08/2020   FERRITIN 15 06/08/2020   2. Elevated ALT measurement Elevated liver transaminases with an ALT predominance combined with obesity and insulin resistance is characteristic, but not diagnostic of non-alcoholic fatty liver disease (NAFLD). NAFLD is the 2nd leading cause of liver transplant in adults. Treatment includes weight loss, elimination of sweet drinks, including juice, avoidance of high fructose corn syrup, and exercise. As always, avoiding alcohol consumption is important.  Plan:  Recheck ALT in 3 months.  Lab Results  Component Value Date   ALT 72 (H) 06/08/2020   AST 28 06/08/2020   ALKPHOS 78 06/08/2020   BILITOT 0.3 06/08/2020   3. Prediabetes Not optimized. Goal is HgbA1c < 5.7.  Medication: Ozempic 0.25 mg subcutaneously weekly.  Plan:  Will refill Ozempic today, as per below.  She will continue to focus on protein-rich, low simple carbohydrate foods. We reviewed the importance of  hydration, regular exercise for stress reduction, and restorative sleep.   Lab Results  Component Value Date   HGBA1C 5.7 (H) 06/08/2020   Lab Results  Component Value Date   INSULIN 20.0 06/08/2020   - Refill Semaglutide,0.25 or 0.5MG /DOS, (OZEMPIC, 0.25 OR 0.5 MG/DOSE,) 2 MG/1.5ML SOPN; Inject 0.25 mg into the skin once a week.  Dispense: 1.5 mL; Refill: 0  4. Vitamin D deficiency Not at goal. Current vitamin D is 25.2, tested on 06/08/2020. Optimal goal > 50 ng/dL.    Plan: Start to take prescription Vitamin D @50 ,000 IU every week as prescribed.  Follow-up for routine testing of Vitamin D, at least 2-3 times per year to avoid over-replacement.  - Start Vitamin D, Ergocalciferol, (DRISDOL) 1.25 MG (50000 UNIT) CAPS capsule; Take 1 capsule (50,000 Units total) by mouth every 7 (seven) days.  Dispense: 4 capsule; Refill: 0  5. Abnormal TSH TSH was at 9.280 on 06/08/2020.  Medication: Synthroid 150 mcg daily. Plan:  Will add Synthroid 25 mcg (for total of 175 mcg daily) and then recheck labs in 6 weeks.  May need 1/2 tablet going forward.  Patient was instructed not to take MVM or iron within 4 hours of taking thyroid medications.  We will continue to monitor alongside Endocrinology/PCP as it relates to her weight loss journey.   Lab Results  Component Value Date   TSH 9.280 (H) 06/08/2020   6. Binge eating disorder Diana Avery meets binge eating disorder (BED) requirements, including: eating until feeling uncomfortably full, eating alone because of feeling embarrassed by how much you are eating, feeling  disgusted and depressed or guilty about binge eating. Plan:  Will consider Vyvanse going forward.  People who binge eat feel as if they don't have control over how much they eat and have feelings of guilt or self-loathing after a binge eating episode. Diana Avery estimates that about 30 percent of adults with binge eating disorder also have a history of ADHD. The FDA has approved Vyvanse  as a treatment option for both ADHD and binge eating. Vyvanse targets the brain's reward center by increasing the levels of dopamine and norepinephrine, the chemicals of the brain responsible for feelings of pleasure. Mindful eating is the recommended nutritional approach to treating BED.   7. At risk for diabetes mellitus Diana Avery was given diabetes prevention education and counseling today of more than 8 minutes. During insulin resistance, several metabolic alterations induce the development of cardiovascular disease. For instance, insulin resistance can induce an imbalance in glucose metabolism that generates chronic hyperglycemia, which in turn triggers oxidative stress and causes an inflammatory response that leads to cell damage. Insulin resistance can also alter systemic lipid metabolism which then leads to the development of dyslipidemia and the well-known lipid triad: (1) high levels of plasma triglycerides, (2) low levels of high-density lipoprotein, and (3) the appearance of small dense low-density lipoproteins. This triad, along with endothelial dysfunction, which can also be induced by aberrant insulin signaling, contribute to atherosclerotic plaque formation.   8. Obesity, current BMI 44.1  Course: Diana Avery is currently in the action stage of change. As such, her goal is to continue with weight loss efforts.   Nutrition goals: She has agreed to the Category 3 Plan.   Exercise goals: For substantial health benefits, adults should do at least 150 minutes (2 hours and 30 minutes) a week of moderate-intensity, or 75 minutes (1 hour and 15 minutes) a week of vigorous-intensity aerobic physical activity, or an equivalent combination of moderate- and vigorous-intensity aerobic activity. Aerobic activity should be performed in episodes of at least 10 minutes, and preferably, it should be spread throughout the week.  Behavioral modification strategies: increasing lean protein intake, decreasing simple  carbohydrates, increasing vegetables and increasing water intake.  Diana Avery has agreed to follow-up with our clinic in 2-3 weeks. She was informed of the importance of frequent follow-up visits to maximize her success with intensive lifestyle modifications for her multiple health conditions.   Objective:   Blood pressure 109/73, pulse 82, temperature 98.3 F (36.8 C), temperature source Oral, height 5\' 3"  (1.6 m), weight 249 lb (112.9 kg), last menstrual period 05/28/2020, SpO2 96 %, unknown if currently breastfeeding. Body mass index is 44.11 kg/m.  General: Cooperative, alert, well developed, in no acute distress. HEENT: Conjunctivae and lids unremarkable. Cardiovascular: Regular rhythm.  Lungs: Normal work of breathing. Neurologic: No focal deficits.   Lab Results  Component Value Date   CREATININE 0.74 06/08/2020   BUN 9 06/08/2020   NA 139 06/08/2020   K 3.8 06/08/2020   CL 100 06/08/2020   CO2 24 06/08/2020   Lab Results  Component Value Date   ALT 72 (H) 06/08/2020   AST 28 06/08/2020   ALKPHOS 78 06/08/2020   BILITOT 0.3 06/08/2020   Lab Results  Component Value Date   HGBA1C 5.7 (H) 06/08/2020   HGBA1C 5.5 11/30/2014   Lab Results  Component Value Date   INSULIN 20.0 06/08/2020   Lab Results  Component Value Date   TSH 9.280 (H) 06/08/2020   Lab Results  Component Value Date  CHOL 176 06/08/2020   HDL 43 06/08/2020   LDLCALC 109 (H) 06/08/2020   TRIG 132 06/08/2020   CHOLHDL 4.1 06/08/2020   Lab Results  Component Value Date   WBC 7.2 06/08/2020   HGB 12.4 06/08/2020   HCT 39.0 06/08/2020   MCV 80 06/08/2020   PLT 364 06/08/2020   Lab Results  Component Value Date   IRON 31 06/08/2020   TIBC 439 06/08/2020   FERRITIN 15 06/08/2020   Attestation Statements:   Reviewed by clinician on day of visit: allergies, medications, problem list, medical history, surgical history, family history, social history, and previous encounter notes.  I,  Water quality scientist, CMA, am acting as transcriptionist for Briscoe Deutscher, DO  I have reviewed the above documentation for accuracy and completeness, and I agree with the above. Briscoe Deutscher, DO

## 2020-06-23 ENCOUNTER — Encounter (INDEPENDENT_AMBULATORY_CARE_PROVIDER_SITE_OTHER): Payer: Self-pay | Admitting: Family Medicine

## 2020-06-23 DIAGNOSIS — R7989 Other specified abnormal findings of blood chemistry: Secondary | ICD-10-CM

## 2020-06-23 MED ORDER — OZEMPIC (0.25 OR 0.5 MG/DOSE) 2 MG/1.5ML ~~LOC~~ SOPN: 0.2500 mg | PEN_INJECTOR | SUBCUTANEOUS | 0 refills | Status: DC

## 2020-06-23 MED ORDER — VITAMIN D (ERGOCALCIFEROL) 1.25 MG (50000 UNIT) PO CAPS: 50000.0000 [IU] | ORAL_CAPSULE | ORAL | 0 refills | Status: DC

## 2020-06-23 NOTE — Telephone Encounter (Signed)
Pt last seen by Dr. Wallace.  

## 2020-06-24 MED ORDER — LEVOTHYROXINE SODIUM 25 MCG PO TABS: 25.0000 ug | ORAL_TABLET | Freq: Every day | ORAL | 0 refills | Status: DC

## 2020-07-07 ENCOUNTER — Telehealth: Payer: Self-pay | Admitting: Physician Assistant

## 2020-07-07 DIAGNOSIS — M545 Low back pain, unspecified: Secondary | ICD-10-CM

## 2020-07-07 DIAGNOSIS — G8929 Other chronic pain: Secondary | ICD-10-CM

## 2020-07-07 NOTE — Progress Notes (Signed)
Based on what you shared with me, I feel your condition warrants further evaluation and I recommend that you be seen in a face to face office visit. Giving chronicity of back pain and need for further evaluation, this is above the scope of what we can do via e-visit or Video Urgent Care visit. I recommend you first reach out to your PCP office to get an in-office evaluation scheduled. You PCP may be able/willing to do a video visit first and then set up any necessary testing, referrals, etc.    NOTE: If you entered your credit card information for this eVisit, you will not be charged. You may see a "hold" on your card for the $35 but that hold will drop off and you will not have a charge processed.   If you are having a true medical emergency please call 911.      For an urgent face to face visit, [place] has six urgent care centers for your convenience:     [facility] [place] [facility] [place] Get Driving Directions [phone] [place], [place] [address] 8 am - 4 pm Monday - Friday    [facility] [place]) Get Driving Directions [phone] [place], [place] [address] 8 am to 8 pm Monday-Friday 10 am to 6 pm [facility] [place] ([place]) Get Driving Directions [phone]  [address] [place],  [place]  [address] 8 am to 8 pm Monday-Friday 8 am to 4 pm [facility] Get Driving Directions [phone] [place], [place],  [address] 8 am to 8 pm Monday-Friday 8 am to 4 pm [facility] at [facility] Get Driving Directions  [phone] [address] [place], [place] [address] 8 am to 8 pm Monday-Friday 8 am to 4 pm [facility] at [place] Get Driving Directions [phone] [address] [place], [place] [address] 8 am to 8 pm Monday-Friday 8 am to 4 pm Saturday-Sunday     Your MyChart E-visit questionnaire answers were reviewed by a board certified advanced clinical practitioner to complete your personal care plan based on your specific symptoms.  Thank you for using e-Visits.

## 2020-07-14 ENCOUNTER — Encounter (INDEPENDENT_AMBULATORY_CARE_PROVIDER_SITE_OTHER): Payer: Self-pay | Admitting: Family Medicine

## 2020-07-14 ENCOUNTER — Ambulatory Visit (INDEPENDENT_AMBULATORY_CARE_PROVIDER_SITE_OTHER): Payer: BC Managed Care – PPO | Admitting: Family Medicine

## 2020-07-14 ENCOUNTER — Other Ambulatory Visit: Payer: Self-pay

## 2020-07-14 VITALS — BP 116/77 | HR 83 | Temp 98.2°F | Ht 63.0 in | Wt 245.0 lb

## 2020-07-14 DIAGNOSIS — Z6841 Body Mass Index (BMI) 40.0 and over, adult: Secondary | ICD-10-CM

## 2020-07-14 DIAGNOSIS — F5081 Binge eating disorder: Secondary | ICD-10-CM

## 2020-07-14 DIAGNOSIS — E038 Other specified hypothyroidism: Secondary | ICD-10-CM | POA: Diagnosis not present

## 2020-07-14 DIAGNOSIS — R7303 Prediabetes: Secondary | ICD-10-CM | POA: Diagnosis not present

## 2020-07-14 DIAGNOSIS — E559 Vitamin D deficiency, unspecified: Secondary | ICD-10-CM

## 2020-07-14 DIAGNOSIS — Z9189 Other specified personal risk factors, not elsewhere classified: Secondary | ICD-10-CM | POA: Diagnosis not present

## 2020-07-14 MED ORDER — ONDANSETRON 4 MG PO TBDP: 4.0000 mg | ORAL_TABLET | Freq: Three times a day (TID) | ORAL | 0 refills | Status: DC | PRN

## 2020-07-14 MED ORDER — OZEMPIC (0.25 OR 0.5 MG/DOSE) 2 MG/1.5ML ~~LOC~~ SOPN: 0.5000 mg | PEN_INJECTOR | SUBCUTANEOUS | 0 refills | Status: AC

## 2020-07-14 MED ORDER — VITAMIN D (ERGOCALCIFEROL) 1.25 MG (50000 UNIT) PO CAPS: 50000.0000 [IU] | ORAL_CAPSULE | ORAL | 0 refills | Status: DC

## 2020-07-14 MED ORDER — LISDEXAMFETAMINE DIMESYLATE 30 MG PO CAPS: 30.0000 mg | ORAL_CAPSULE | Freq: Every day | ORAL | 0 refills | Status: DC

## 2020-07-20 ENCOUNTER — Encounter (INDEPENDENT_AMBULATORY_CARE_PROVIDER_SITE_OTHER): Payer: Self-pay

## 2020-07-21 NOTE — Progress Notes (Signed)
Chief Complaint:   OBESITY Diana Avery is here to discuss her progress with her obesity treatment plan along with follow-up of her obesity related diagnoses.   Today's visit was #: 3 Starting weight: 250 lbs Starting date: 06/08/2020 Today's weight: 245 lbs Today's date: 07/14/2020 Weight change since last visit: 4 lbs Total lbs lost to date: 5 lbs Body mass index is 43.4 kg/m.  Total weight loss percentage to date: -2.00%  Interim History: [name] says she tried to discontinue HTCT, but had to restart due to headaches, and she noticed some swelling.  Tolerating Ozempic 0.25 subcutaneously weekly.  Current Meal Plan: the Category 3 Plan for 50% of the time.  Current Exercise Plan: Dancing/walking for 30 minutes 2-4 times per week. Current Anti-Obesity Medications: Ozempic 0.25 mg subcutaneously weekly. Side effects: None.  Assessment/Plan:   1. Other specified hypothyroidism Medication: levothyroxine 175 mcg daily.   Plan:  Will recheck TSH in 3 weeks. Patient was instructed not to take MVM or iron within 4 hours of taking thyroid medications.  We will continue to monitor alongside Endocrinology/PCP as it relates to her weight loss journey.   Lab Results  Component Value Date   TSH 9.280 (H) 06/08/2020   2. Vitamin D deficiency Not at goal.  She is taking vitamin D 50,000 IU weekly.  Plan: Continue to take prescription Vitamin D @50 ,000 IU every week as prescribed.  Follow-up for routine testing of Vitamin D, at least 2-3 times per year to avoid over-replacement.  Lab Results  Component Value Date   VD25OH 25.2 (L) 06/08/2020   - Refill Vitamin D, Ergocalciferol, (DRISDOL) 1.25 MG (50000 UNIT) CAPS capsule; Take 1 capsule (50,000 Units total) by mouth every 7 (seven) days.  Dispense: 4 capsule; Refill: 0  3. Prediabetes At goal. Goal is HgbA1c < 5.7.  Medication: Ozempic 0.25 mg subcutaneously weekly.    Plan:  Increase Ozempic to 0.5 mg subcutaneously weekly and start  Zofran 4 mg every 8 hours as needed for nausea.  She will continue to focus on protein-rich, low simple carbohydrate foods. We reviewed the importance of hydration, regular exercise for stress reduction, and restorative sleep.   Lab Results  Component Value Date   HGBA1C 5.7 (H) 06/08/2020   Lab Results  Component Value Date   INSULIN 20.0 06/08/2020   - Start ondansetron (ZOFRAN ODT) 4 MG disintegrating tablet; Take 1 tablet (4 mg total) by mouth every 8 (eight) hours as needed for nausea or vomiting.  Dispense: 20 tablet; Refill: 0 - Increase and refill semaglutide,0.25 or 0.5MG /DOS, (OZEMPIC, 0.25 OR 0.5 MG/DOSE,) 2 MG/1.5ML SOPN; Inject 0.5 mg into the skin once a week.  Dispense: 1.5 mL; Refill: 0  4. Binge eating disorder Rajvi meets binge eating disorder (BED) requirements, including: eating until feeling uncomfortably full, eating alone because of feeling embarrassed by how much you are eating, feeling disgusted, and depressed or guilty about binge eating.   Plan:  Start Vyvanse 30 mg daily.  Okay to stop Wellbutrin.  People who binge eat feel as if they don't have control over how much they eat and have feelings of guilt or self-loathing after a binge eating episode. [name] estimates that about 30 percent of adults with binge eating disorder also have a history of ADHD. The FDA has approved Vyvanse as a treatment option for both ADHD and binge eating. Vyvanse targets the brain's reward center by increasing the levels of dopamine and norepinephrine, the chemicals of the brain responsible  for feelings of pleasure. Mindful eating is the recommended nutritional approach to treating BED.   I have consulted the [place] Controlled Substances Registry for this patient, and feel the risk/benefit ratio today is favorable for proceeding with this prescription for a controlled substance. The patient understands monitoring parameters and red flags.   - Start lisdexamfetamine (VYVANSE) 30 MG  capsule; Take 1 capsule (30 mg total) by mouth daily.  Dispense: 30 capsule; Refill: 0  5. At risk for heart disease Due to [name]'s current state of health and medical condition(s), she is at a higher risk for heart disease.  This puts the patient at much greater risk to subsequently develop cardiopulmonary conditions that can significantly affect patient's quality of life in a negative manner.    At least 8 minutes were spent on counseling [name] about these concerns today. Evidence-based interventions for health behavior change were utilized today including the discussion of self monitoring techniques, problem-solving barriers, and SMART goal setting techniques.  Specifically, regarding patient's less desirable eating habits and patterns, we employed the technique of small changes when [name] has not been able to fully commit to her prudent nutritional plan.  6. Obesity, current BMI 43.5  Course: Tyrisha is currently in the action stage of change. As such, her goal is to continue with weight loss efforts.   Nutrition goals: She has agreed to the Category 3 Plan.   Exercise goals:  As is.  Behavioral modification strategies: increasing lean protein intake, decreasing simple carbohydrates, increasing vegetables, increasing water intake, and decreasing liquid calories.  [name] has agreed to follow-up with our clinic in 3 weeks. She was informed of the importance of frequent follow-up visits to maximize her success with intensive lifestyle modifications for her multiple health conditions.   Objective:   Blood pressure 116/77, pulse 83, temperature 98.2 F (36.8 C), temperature source Oral, height 5\' 3"  (1.6 m), weight 245 lb (111.1 kg), SpO2 99 %, unknown if currently breastfeeding. Body mass index is 43.4 kg/m.  General: Cooperative, alert, well developed, in no acute distress. HEENT: Conjunctivae and lids unremarkable. Cardiovascular: Regular rhythm.  Lungs: Normal work of  breathing. Neurologic: No focal deficits.   Lab Results  Component Value Date   CREATININE 0.74 06/08/2020   BUN 9 06/08/2020   NA 139 06/08/2020   K 3.8 06/08/2020   CL 100 06/08/2020   CO2 24 06/08/2020   Lab Results  Component Value Date   ALT 72 (H) 06/08/2020   AST 28 06/08/2020   ALKPHOS 78 06/08/2020   BILITOT 0.3 06/08/2020   Lab Results  Component Value Date   HGBA1C 5.7 (H) 06/08/2020   HGBA1C 5.5 11/30/2014   Lab Results  Component Value Date   INSULIN 20.0 06/08/2020   Lab Results  Component Value Date   TSH 9.280 (H) 06/08/2020   Lab Results  Component Value Date   CHOL 176 06/08/2020   HDL 43 06/08/2020   LDLCALC 109 (H) 06/08/2020   TRIG 132 06/08/2020   CHOLHDL 4.1 06/08/2020   Lab Results  Component Value Date   VD25OH 25.2 (L) 06/08/2020   Lab Results  Component Value Date   WBC 7.2 06/08/2020   HGB 12.4 06/08/2020   HCT 39.0 06/08/2020   MCV 80 06/08/2020   PLT 364 06/08/2020   Lab Results  Component Value Date   IRON 31 06/08/2020   TIBC 439 06/08/2020   FERRITIN 15 06/08/2020   Attestation Statements:   Reviewed by clinician on day of  visit: allergies, medications, problem list, medical history, surgical history, family history, social history, and previous encounter notes.  I, Water quality scientist, CMA, am acting as transcriptionist for Briscoe Deutscher, DO  I have reviewed the above documentation for accuracy and completeness, and I agree with the above. Briscoe Deutscher, DO

## 2020-07-22 ENCOUNTER — Other Ambulatory Visit (INDEPENDENT_AMBULATORY_CARE_PROVIDER_SITE_OTHER): Payer: Self-pay | Admitting: Family Medicine

## 2020-07-22 DIAGNOSIS — R7989 Other specified abnormal findings of blood chemistry: Secondary | ICD-10-CM

## 2020-07-22 NOTE — Telephone Encounter (Signed)
Pt last seen by Dr. Wallace.  

## 2020-07-23 ENCOUNTER — Encounter (INDEPENDENT_AMBULATORY_CARE_PROVIDER_SITE_OTHER): Payer: Self-pay | Admitting: Family Medicine

## 2020-08-11 ENCOUNTER — Ambulatory Visit (INDEPENDENT_AMBULATORY_CARE_PROVIDER_SITE_OTHER): Payer: BC Managed Care – PPO | Admitting: Family Medicine

## 2020-08-11 ENCOUNTER — Other Ambulatory Visit: Payer: Self-pay

## 2020-08-11 ENCOUNTER — Ambulatory Visit (HOSPITAL_COMMUNITY)
Admission: RE | Admit: 2020-08-11 | Discharge: 2020-08-11 | Disposition: A | Discharge status: Home / Self Care | Payer: BC Managed Care – PPO | Source: Ambulatory Visit | Attending: Obstetrics | Admitting: Obstetrics

## 2020-08-11 ENCOUNTER — Encounter (HOSPITAL_COMMUNITY): Payer: Self-pay | Admitting: Obstetrics

## 2020-08-11 ENCOUNTER — Encounter (HOSPITAL_COMMUNITY)
Admission: RE | Disposition: A | Discharge status: Home / Self Care | Payer: Self-pay | Source: Ambulatory Visit | Attending: Obstetrics

## 2020-08-11 ENCOUNTER — Ambulatory Visit (HOSPITAL_COMMUNITY): Payer: BC Managed Care – PPO | Admitting: Anesthesiology

## 2020-08-11 ENCOUNTER — Other Ambulatory Visit: Payer: Self-pay | Admitting: Obstetrics

## 2020-08-11 DIAGNOSIS — O3481 Maternal care for other abnormalities of pelvic organs, first trimester: Secondary | ICD-10-CM | POA: Insufficient documentation

## 2020-08-11 DIAGNOSIS — O99211 Obesity complicating pregnancy, first trimester: Secondary | ICD-10-CM | POA: Insufficient documentation

## 2020-08-11 DIAGNOSIS — N83201 Unspecified ovarian cyst, right side: Secondary | ICD-10-CM | POA: Insufficient documentation

## 2020-08-11 DIAGNOSIS — O00102 Left tubal pregnancy without intrauterine pregnancy: Secondary | ICD-10-CM | POA: Insufficient documentation

## 2020-08-11 DIAGNOSIS — E669 Obesity, unspecified: Secondary | ICD-10-CM | POA: Insufficient documentation

## 2020-08-11 DIAGNOSIS — D282 Benign neoplasm of uterine tubes and ligaments: Secondary | ICD-10-CM | POA: Insufficient documentation

## 2020-08-11 DIAGNOSIS — E039 Hypothyroidism, unspecified: Secondary | ICD-10-CM | POA: Insufficient documentation

## 2020-08-11 DIAGNOSIS — Z79890 Hormone replacement therapy (postmenopausal): Secondary | ICD-10-CM | POA: Diagnosis not present

## 2020-08-11 DIAGNOSIS — O99281 Endocrine, nutritional and metabolic diseases complicating pregnancy, first trimester: Secondary | ICD-10-CM | POA: Insufficient documentation

## 2020-08-11 DIAGNOSIS — Z3A01 Less than 8 weeks gestation of pregnancy: Secondary | ICD-10-CM | POA: Insufficient documentation

## 2020-08-11 DIAGNOSIS — Z79899 Other long term (current) drug therapy: Secondary | ICD-10-CM | POA: Diagnosis not present

## 2020-08-11 HISTORY — DX: Personal history of urinary calculi: Z87.442

## 2020-08-11 HISTORY — DX: Nausea with vomiting, unspecified: Z98.890

## 2020-08-11 HISTORY — DX: Prediabetes: R73.03

## 2020-08-11 HISTORY — PX: LAPAROSCOPY: SHX197

## 2020-08-11 HISTORY — DX: Other specified postprocedural states: R11.2

## 2020-08-11 LAB — CBC
HCT: 35.9 % — ABNORMAL LOW (ref 36.0–46.0)
Hemoglobin: 11.2 g/dL — ABNORMAL LOW (ref 12.0–15.0)
MCH: 25.6 pg — ABNORMAL LOW (ref 26.0–34.0)
MCHC: 31.2 g/dL (ref 30.0–36.0)
MCV: 82 fL (ref 80.0–100.0)
Platelets: 400 10*3/uL (ref 150–400)
RBC: 4.38 MIL/uL (ref 3.87–5.11)
RDW: 15.9 % — ABNORMAL HIGH (ref 11.5–15.5)
WBC: 10 10*3/uL (ref 4.0–10.5)
nRBC: 0 % (ref 0.0–0.2)

## 2020-08-11 LAB — TYPE AND SCREEN
ABO/RH(D): A POS
Antibody Screen: NEGATIVE
DAT, IgG: POSITIVE

## 2020-08-11 LAB — BASIC METABOLIC PANEL
Anion gap: 5 (ref 5–15)
BUN: 8 mg/dL (ref 6–20)
CO2: 23 mmol/L (ref 22–32)
Calcium: 8.3 mg/dL — ABNORMAL LOW (ref 8.9–10.3)
Chloride: 109 mmol/L (ref 98–111)
Creatinine, Ser: 0.66 mg/dL (ref 0.44–1.00)
GFR, Estimated: >60 mL/min (ref >60)
Glucose, Bld: 90 mg/dL (ref 70–99)
Potassium: 3.8 mmol/L (ref 3.5–5.1)
Sodium: 137 mmol/L (ref 135–145)

## 2020-08-11 LAB — GLUCOSE, CAPILLARY
Glucose-Capillary: 85 mg/dL (ref 70–99)
Glucose-Capillary: 80 mg/dL (ref 70–99)

## 2020-08-11 LAB — ABO/RH: ABO/RH(D): A POS

## 2020-08-11 SURGERY — LAPAROSCOPY OPERATIVE
Anesthesia: General | Laterality: Left

## 2020-08-11 MED ORDER — ACETAMINOPHEN 500 MG PO TABS
ORAL_TABLET | ORAL | Status: AC
  Administered 2020-08-11: 1000 mg via ORAL
  Filled 2020-08-11: qty 2

## 2020-08-11 MED ORDER — CHLORHEXIDINE GLUCONATE 0.12 % MT SOLN
15.0000 mL | Freq: Once | OROMUCOSAL | Status: AC
  Administered 2020-08-11: 15 mL via OROMUCOSAL
  Filled 2020-08-11: qty 15

## 2020-08-11 MED ORDER — OXYCODONE HCL 5 MG PO TABS
5.0000 mg | ORAL_TABLET | Freq: Once | ORAL | Status: AC | PRN
  Administered 2020-08-11: 5 mg via ORAL

## 2020-08-11 MED ORDER — OXYCODONE HCL 5 MG PO TABS
ORAL_TABLET | ORAL | Status: AC
  Filled 2020-08-11: qty 1

## 2020-08-11 MED ORDER — KETOROLAC TROMETHAMINE 30 MG/ML IJ SOLN
INTRAMUSCULAR | Status: AC
  Filled 2020-08-11: qty 1

## 2020-08-11 MED ORDER — SCOPOLAMINE 1 MG/3DAYS TD PT72: 1.0000 | MEDICATED_PATCH | TRANSDERMAL | Status: DC

## 2020-08-11 MED ORDER — ACETAMINOPHEN 500 MG PO TABS: 1000.0000 mg | ORAL_TABLET | Freq: Once | ORAL | Status: AC

## 2020-08-11 MED ORDER — DEXMEDETOMIDINE (PRECEDEX) IN NS 20 MCG/5ML (4 MCG/ML) IV SYRINGE
PREFILLED_SYRINGE | INTRAVENOUS | Status: DC | PRN
  Administered 2020-08-11: 12 ug via INTRAVENOUS

## 2020-08-11 MED ORDER — ROCURONIUM BROMIDE 10 MG/ML (PF) SYRINGE
PREFILLED_SYRINGE | INTRAVENOUS | Status: AC
  Filled 2020-08-11: qty 10

## 2020-08-11 MED ORDER — ONDANSETRON 8 MG PO TBDP: 8.0000 mg | ORAL_TABLET | Freq: Two times a day (BID) | ORAL | 0 refills | Status: DC | PRN

## 2020-08-11 MED ORDER — PROPOFOL 10 MG/ML IV BOLUS
INTRAVENOUS | Status: AC
  Filled 2020-08-11: qty 20

## 2020-08-11 MED ORDER — CEFAZOLIN SODIUM-DEXTROSE 2-4 GM/100ML-% IV SOLN
INTRAVENOUS | Status: AC
  Filled 2020-08-11: qty 100

## 2020-08-11 MED ORDER — HYDROMORPHONE HCL 1 MG/ML IJ SOLN
0.2500 mg | INTRAMUSCULAR | Status: DC | PRN
  Administered 2020-08-11 (×2): 0.5 mg via INTRAVENOUS

## 2020-08-11 MED ORDER — MIDAZOLAM HCL 2 MG/2ML IJ SOLN
INTRAMUSCULAR | Status: AC
  Filled 2020-08-11: qty 2

## 2020-08-11 MED ORDER — OXYCODONE-ACETAMINOPHEN 5-325 MG PO TABS: 1.0000 | ORAL_TABLET | Freq: Four times a day (QID) | ORAL | 0 refills | Status: AC | PRN

## 2020-08-11 MED ORDER — FENTANYL CITRATE (PF) 250 MCG/5ML IJ SOLN
INTRAMUSCULAR | Status: DC | PRN
  Administered 2020-08-11: 100 ug via INTRAVENOUS
  Administered 2020-08-11 (×3): 50 ug via INTRAVENOUS

## 2020-08-11 MED ORDER — PROMETHAZINE HCL 25 MG/ML IJ SOLN: 6.2500 mg | INTRAMUSCULAR | Status: DC | PRN

## 2020-08-11 MED ORDER — APREPITANT 40 MG PO CAPS: 40.0000 mg | ORAL_CAPSULE | Freq: Once | ORAL | Status: AC

## 2020-08-11 MED ORDER — APREPITANT 40 MG PO CAPS
ORAL_CAPSULE | ORAL | Status: AC
  Administered 2020-08-11: 40 mg via ORAL
  Filled 2020-08-11: qty 1

## 2020-08-11 MED ORDER — PROPOFOL 10 MG/ML IV BOLUS
INTRAVENOUS | Status: DC | PRN
Start: 2020-08-11 — End: 2020-08-11
  Administered 2020-08-11: 200 mg via INTRAVENOUS

## 2020-08-11 MED ORDER — GLYCOPYRROLATE PF 0.2 MG/ML IJ SOSY
PREFILLED_SYRINGE | INTRAMUSCULAR | Status: AC
  Filled 2020-08-11: qty 1

## 2020-08-11 MED ORDER — MEPERIDINE HCL 25 MG/ML IJ SOLN: 6.2500 mg | INTRAMUSCULAR | Status: DC | PRN

## 2020-08-11 MED ORDER — IBUPROFEN 600 MG PO TABS: 600.0000 mg | ORAL_TABLET | Freq: Four times a day (QID) | ORAL | 1 refills | Status: DC | PRN

## 2020-08-11 MED ORDER — DEXMEDETOMIDINE (PRECEDEX) IN NS 20 MCG/5ML (4 MCG/ML) IV SYRINGE
PREFILLED_SYRINGE | INTRAVENOUS | Status: AC
  Filled 2020-08-11: qty 5

## 2020-08-11 MED ORDER — LACTATED RINGERS IV SOLN: INTRAVENOUS | Status: DC

## 2020-08-11 MED ORDER — BUPIVACAINE HCL (PF) 0.25 % IJ SOLN
INTRAMUSCULAR | Status: AC
  Filled 2020-08-11: qty 30

## 2020-08-11 MED ORDER — DEXAMETHASONE SODIUM PHOSPHATE 10 MG/ML IJ SOLN
INTRAMUSCULAR | Status: AC
  Filled 2020-08-11: qty 1

## 2020-08-11 MED ORDER — ONDANSETRON HCL 4 MG/2ML IJ SOLN
INTRAMUSCULAR | Status: AC
  Filled 2020-08-11: qty 2

## 2020-08-11 MED ORDER — MIDAZOLAM HCL 2 MG/2ML IJ SOLN
INTRAMUSCULAR | Status: DC | PRN
  Administered 2020-08-11: 2 mg via INTRAVENOUS

## 2020-08-11 MED ORDER — ROCURONIUM BROMIDE 10 MG/ML (PF) SYRINGE
PREFILLED_SYRINGE | INTRAVENOUS | Status: DC | PRN
  Administered 2020-08-11: 30 mg via INTRAVENOUS
  Administered 2020-08-11 (×2): 20 mg via INTRAVENOUS
  Administered 2020-08-11: 80 mg via INTRAVENOUS

## 2020-08-11 MED ORDER — GLYCOPYRROLATE PF 0.2 MG/ML IJ SOSY
PREFILLED_SYRINGE | INTRAMUSCULAR | Status: DC | PRN
  Administered 2020-08-11: .2 mg via INTRAVENOUS

## 2020-08-11 MED ORDER — OXYCODONE HCL 5 MG/5ML PO SOLN
5.0000 mg | Freq: Once | ORAL | Status: AC | PRN
Start: 2020-08-11 — End: 2020-08-11

## 2020-08-11 MED ORDER — SODIUM CHLORIDE 0.9 % IR SOLN
Status: DC | PRN
  Administered 2020-08-11: 1000 mL

## 2020-08-11 MED ORDER — ARTIFICIAL TEARS OPHTHALMIC OINT
TOPICAL_OINTMENT | OPHTHALMIC | Status: AC
  Filled 2020-08-11: qty 3.5

## 2020-08-11 MED ORDER — CEFAZOLIN SODIUM-DEXTROSE 2-4 GM/100ML-% IV SOLN
2.0000 g | INTRAVENOUS | Status: AC
  Administered 2020-08-11: 2 g via INTRAVENOUS

## 2020-08-11 MED ORDER — HYDROMORPHONE HCL 1 MG/ML IJ SOLN
INTRAMUSCULAR | Status: AC
  Filled 2020-08-11: qty 1

## 2020-08-11 MED ORDER — BUPIVACAINE HCL (PF) 0.25 % IJ SOLN
INTRAMUSCULAR | Status: DC | PRN
  Administered 2020-08-11: 30 mL

## 2020-08-11 MED ORDER — ONDANSETRON HCL 4 MG/2ML IJ SOLN
INTRAMUSCULAR | Status: DC | PRN
  Administered 2020-08-11: 4 mg via INTRAVENOUS

## 2020-08-11 MED ORDER — LIDOCAINE 2% (20 MG/ML) 5 ML SYRINGE
INTRAMUSCULAR | Status: AC
  Filled 2020-08-11: qty 5

## 2020-08-11 MED ORDER — SILVER NITRATE-POT NITRATE 75-25 % EX MISC
CUTANEOUS | Status: AC
  Filled 2020-08-11: qty 10

## 2020-08-11 MED ORDER — FENTANYL CITRATE (PF) 250 MCG/5ML IJ SOLN
INTRAMUSCULAR | Status: AC
  Filled 2020-08-11: qty 5

## 2020-08-11 MED ORDER — DEXAMETHASONE SODIUM PHOSPHATE 10 MG/ML IJ SOLN
INTRAMUSCULAR | Status: DC | PRN
Start: 2020-08-11 — End: 2020-08-11
  Administered 2020-08-11: 5 mg via INTRAVENOUS

## 2020-08-11 MED ORDER — LIDOCAINE 2% (20 MG/ML) 5 ML SYRINGE
INTRAMUSCULAR | Status: DC | PRN
Start: 2020-08-11 — End: 2020-08-11
  Administered 2020-08-11: 60 mg via INTRAVENOUS

## 2020-08-11 MED ORDER — SUGAMMADEX SODIUM 200 MG/2ML IV SOLN
INTRAVENOUS | Status: DC | PRN
  Administered 2020-08-11: 400 mg via INTRAVENOUS

## 2020-08-11 MED ORDER — SCOPOLAMINE 1 MG/3DAYS TD PT72
MEDICATED_PATCH | TRANSDERMAL | Status: AC
  Administered 2020-08-11: 1.5 mg via TRANSDERMAL
  Filled 2020-08-11: qty 1

## 2020-08-11 MED ORDER — ORAL CARE MOUTH RINSE: 15.0000 mL | Freq: Once | OROMUCOSAL | Status: AC

## 2020-08-11 MED ORDER — 0.9 % SODIUM CHLORIDE (POUR BTL) OPTIME
TOPICAL | Status: DC | PRN
  Administered 2020-08-11: 1000 mL

## 2020-08-11 MED ORDER — KETOROLAC TROMETHAMINE 30 MG/ML IJ SOLN
30.0000 mg | Freq: Once | INTRAMUSCULAR | Status: AC | PRN
  Administered 2020-08-11: 30 mg via INTRAVENOUS

## 2020-08-11 MED ORDER — POVIDONE-IODINE 10 % EX SWAB
2.0000 "application " | Freq: Once | CUTANEOUS | Status: AC
  Administered 2020-08-11: 2 via TOPICAL

## 2020-08-11 SURGICAL SUPPLY — 43 items
BAG COUNTER SPONGE SURGICOUNT (BAG) ×2 IMPLANT
CABLE HIGH FREQUENCY MONO STRZ (ELECTRODE) IMPLANT
CNTNR URN SCR LID CUP LEK RST (MISCELLANEOUS) ×1 IMPLANT
CONT SPEC 4OZ STRL OR WHT (MISCELLANEOUS) ×1
DECANTER SPIKE VIAL GLASS SM (MISCELLANEOUS) ×2 IMPLANT
DERMABOND ADVANCED (GAUZE/BANDAGES/DRESSINGS) ×1
DERMABOND ADVANCED .7 DNX12 (GAUZE/BANDAGES/DRESSINGS) ×1 IMPLANT
DISSECTOR BLUNT TIP ENDO 5MM (MISCELLANEOUS) ×2 IMPLANT
DRSG OPSITE POSTOP 3X4 (GAUZE/BANDAGES/DRESSINGS) ×2 IMPLANT
DURAPREP 26ML APPLICATOR (WOUND CARE) ×2 IMPLANT
ELECT REM PT RETURN 9FT ADLT (ELECTROSURGICAL) ×2
ELECTRODE REM PT RTRN 9FT ADLT (ELECTROSURGICAL) ×1 IMPLANT
FORCEPS CUTTING 33CM 5MM (CUTTING FORCEPS) ×2 IMPLANT
GAUZE 4X4 16PLY ~~LOC~~+RFID DBL (SPONGE) ×2 IMPLANT
GLOVE SURG ENC MOIS LTX SZ6.5 (GLOVE) ×2 IMPLANT
GLOVE SURG UNDER POLY LF SZ7 (GLOVE) ×8 IMPLANT
GOWN STRL REUS W/ TWL LRG LVL3 (GOWN DISPOSABLE) ×2 IMPLANT
GOWN STRL REUS W/TWL LRG LVL3 (GOWN DISPOSABLE) ×2
KIT TURNOVER KIT B (KITS) ×2 IMPLANT
MANIPULATOR UTERINE 4.5 ZUMI (MISCELLANEOUS) ×2 IMPLANT
NEEDLE INSUFFLATION 14GA 120MM (NEEDLE) ×2 IMPLANT
NEEDLE INSUFFLATION 150MM (ENDOMECHANICALS) ×2 IMPLANT
PACK LAPAROSCOPY BASIN (CUSTOM PROCEDURE TRAY) ×2 IMPLANT
PACK TRENDGUARD 450 HYBRID PRO (MISCELLANEOUS) ×1 IMPLANT
PENCIL BUTTON HOLSTER BLD 10FT (ELECTRODE) IMPLANT
POUCH SPECIMEN RETRIEVAL 10MM (ENDOMECHANICALS) ×2 IMPLANT
PROTECTOR NERVE ULNAR (MISCELLANEOUS) IMPLANT
SCISSORS LAP 5X35 DISP (ENDOMECHANICALS) IMPLANT
SET IRRIG TUBING LAPAROSCOPIC (IRRIGATION / IRRIGATOR) ×2 IMPLANT
SET TUBE SMOKE EVAC HIGH FLOW (TUBING) ×2 IMPLANT
SLEEVE ENDOPATH XCEL 5M (ENDOMECHANICALS) ×4 IMPLANT
SOLUTION ELECTROLUBE (MISCELLANEOUS) IMPLANT
SUT VICRYL 0 UR6 27IN ABS (SUTURE) ×2 IMPLANT
SUT VICRYL 4-0 PS2 18IN ABS (SUTURE) ×2 IMPLANT
TOWEL GREEN STERILE FF (TOWEL DISPOSABLE) ×2 IMPLANT
TRAY FOLEY W/BAG SLVR 14FR (SET/KITS/TRAYS/PACK) ×2 IMPLANT
TRENDGUARD 450 HYBRID PRO PACK (MISCELLANEOUS) ×2
TROCAR 12M 150ML BLUNT (TROCAR) ×2 IMPLANT
TROCAR 5M 150ML BLDLS (TROCAR) ×2 IMPLANT
TROCAR BALLN 12MMX100 BLUNT (TROCAR) ×2 IMPLANT
TROCAR XCEL NON-BLD 11X100MML (ENDOMECHANICALS) ×2 IMPLANT
TROCAR XCEL NON-BLD 5MMX100MML (ENDOMECHANICALS) ×2 IMPLANT
WARMER LAPAROSCOPE (MISCELLANEOUS) ×2 IMPLANT

## 2020-08-11 NOTE — Transfer of Care (Signed)
Immediate Anesthesia Transfer of Care Note  Patient: Diana Avery  Procedure(s) Performed: LAPAROSCOPY OPERATIVE/Left Salpingectomy, (Left)  Patient Location: PACU  Anesthesia Type:General  Level of Consciousness: awake, alert  and oriented  Airway & Oxygen Therapy: Patient Spontanous Breathing  Post-op Assessment: Report given to RN and Post -op Vital signs reviewed and stable  Post vital signs: Reviewed and stable  Last Vitals:  Vitals Value Taken Time  BP    Temp    Pulse 74 08/11/20 1812  Resp 15 08/11/20 1812  SpO2 99 % 08/11/20 1812  Vitals shown include unvalidated device data.  Last Pain:  Vitals:   08/11/20 1244  TempSrc:   PainSc: 2          Complications: No notable events documented.

## 2020-08-11 NOTE — Op Note (Signed)
Date: 08/11/20  Patient: Ticara Waner MRN: [id number]  Preoperative Diagnosis: Ectopic pregnancy Postoperative Diagnosis: Same  Procedure: Diagnostic laparoscopy  Surgeon: Michaelle Birks, MD Co-Surgeon: Aloha Gell, MD  Indications: The patient is a 30 yo female who was undergoing laparoscopic salpingectomy for ectopic pregnancy, and I was consulted intraoperatively by Dr. Valentino Saxon to evaluate the small bowel due to concern for injury during trocar placement.  Findings: Small bowel and colon normal in appearance without signs of injury.  Procedure details: When I entered the procedure laparoscopic access had been attempted at the umbilicus but was not successful. A 90mm visiport was placed in the LUQ at Palmer's point. The umbilicus was then inspected and although the fascia had been opened the peritoneum still appeared in tact at this point. A 44mm port was placed at the umbilicus under direct visualization. Two 92mm ports were then placed in the RLQ and LLQ. After Dr. Jerrilyn Cairo portion of the procedure, the small bowel was run from the terminal ileum to the ligament of Treitz and back to the terminal ileum, and there were no signs of enterotomy or injury to the mesentery. The colon was grossly normal in appearance. The remainder of the procedure was turned back over to Dr. Pamala Hurry at this point.  Michaelle Birks, MD 08/11/20 5:22 PM

## 2020-08-11 NOTE — Anesthesia Postprocedure Evaluation (Signed)
Anesthesia Post Note  Patient: Diana Avery  Procedure(s) Performed: LAPAROSCOPY OPERATIVE/Left Salpingectomy, (Left)     Patient location during evaluation: PACU Anesthesia Type: General Level of consciousness: sedated Pain management: pain level controlled Vital Signs Assessment: post-procedure vital signs reviewed and stable Respiratory status: spontaneous breathing and respiratory function stable Cardiovascular status: stable Postop Assessment: no apparent nausea or vomiting Anesthetic complications: no   No notable events documented.  Last Vitals:  Vitals:   08/11/20 1911 08/11/20 1926  BP: 133/79 (!) 145/82  Pulse: 71 88  Resp: 14 17  Temp:    SpO2: 92% 92%    Last Pain:  Vitals:   08/11/20 1911  TempSrc:   PainSc: 8                  Mariesa Grieder DANIEL

## 2020-08-11 NOTE — Op Note (Signed)
08/11/2020  6:28 PM  PATIENT:  Diana Avery  30 y.o. female  PRE-OPERATIVE DIAGNOSIS:  Live Left Ectopic Pregnancy  POST-OPERATIVE DIAGNOSIS:  Live Left Ectopic Pregnancy, right hemorrhagic ovarian cyst  PROCEDURE:  Procedure(s): LAPAROSCOPY OPERATIVE/Left Salpingectomy, (Left) Exploration of bowel, diagnostic  SURGEON:  Surgeon(s) and Role:    Aloha Gell, MD - Primary    * Dwan Bolt, MD - Assisting   PHYSICIAN ASSISTANT: Derrell Lolling, CNM  ASSISTANTS: As above  ANESTHESIA:   local and general  EBL:  100 mL   BLOOD ADMINISTERED:none  DRAINS: Urinary Catheter (Foley)   LOCAL MEDICATIONS USED:  MARCAINE     SPECIMEN: Left fallopian tube with ectopic pregnancy  DISPOSITION OF SPECIMEN:  PATHOLOGY  COUNTS:  YES  TOURNIQUET:  * No tourniquets in log *  DICTATION: .Dragon Dictation  PLAN OF CARE: Discharge to home after PACU  PATIENT DISPOSITION:  PACU - hemodynamically stable.   Delay start of Pharmacological VTE agent (>24hrs) due to surgical blood loss or risk of bleeding: yes   Indications: This is a 30 year old G6 P0-0-5-0 who presented to the office with early pregnancy cramping and had an ultrasound that revealed a left ectopic pregnancy with gestational sac, yolk sac, fetal pole at 5 weeks and 5 days and a heartbeat of 94.  Given the live ectopic pregnancy recommendation is made for salpingectomy and decision was made to proceed with laparoscopic risks and benefits were discussed with the patient prior to surgery including bleeding, infection, damage to surrounding organs, anesthetic risks and need for open laparotomy.  Procedure: After informed consent including discussion of risks of bleeding, infection, failure to achieve desired outcome and discussion of alternatives, the patient was taken to the operating room where general anesthesia was initiated without difficulty. She was prepped and draped in normal sterile fashion in the dorsal  supine lithotomy position. A Foley catheter was inserted sterilely into the bladder. A bimanual examination was done to assess the size and position of the uterus. The speculum was inserted into the vagina. A single-tooth tenaculum was used to grasp the anterior lip of the cervix. The cervix was dilated to a #21 Pratt  dilator. A ZUMI uterine manipulator was easily inserted into the uterus and the intrauterine balloon was inflated.  Attention was then turned to the patient's abdomen. 0.5 % marcaine was used prior to all incision. A total of 14 cc of marcaine was used.  Initial plan was for a nonbladed entry through the umbilicus but as a 10 mm nonbladed trocar was unavailable decision was made to proceed with a open entry for better closure of the fascia. A 10 mm incision was made in the umbilicus and blunt and sharp dissection was done until the fascia was identified.  S retractors and Army-Nav[employer] retractors was used given the deep depth of subcutaneous fat once the fascia was identified, this was then grasped with Kocher clamps x2 and entered sharply. A pursestring suture of 0 Vicryl was then placed around the fascial.  At this point difficulty was had trying to get through the peritoneal layer.  I attempted with blunt dissection through the fascial incision but was unable to enter the peritoneal cavity.  I then attempted using retractors to visualize peritoneal tissue so I could grasped and entered sharply but due to bleeding in the incision and the depth I was never able to identify or grasped peritoneal tissue for adequate entry.  At this point I decided to use a veries needle  to attain pneumoperitoneum to ease peritoneal entry.  Going through the umbilicus but just lateral to my fascial incision I attempted to place the varies needle into the peritoneal cavity but with initiation of gas, leak was coming out through the prior fascial incision. It was unclear if I was insufflating abdominal cavity or  preperitoneal tissue.  Several attempts were made with the varies needle and all were unsuccessful in obtaining adequate pneumoperitoneum despite cinching down the pursestring suture of the fascial incision.  I then attempted to use a 12 mm nonbladed trocar through the already pursestringed fascial opening to gain entry into the peritoneal cavity.  On the first attempt it was clear I was not into the peritoneal cavity but instead tracked through the abdominal adipose tissue.  A second attempt was made and at this point it was unclear if entry into the peritoneal cavity was made but my trocar was lodged in the omentum or possibly bowel or if I had again tracked into the abdominal adipose tissue.  no pelvic organs were seen and the trocar was removed.  At this time I called for general surgery assistance to evaluate the bowel.  And then decide on the left upper quadrant port.  I asked anesthesia to place a OG tube to suction.  Once this was done a 5 mm incision was made in the left upper quadrant after first being Marcaine.  The varies needle was placed through this incision and adequate pneumoperitoneum achieved to 15 mmHg.  We did need to place a gauze in the umbilical incision.  A 5 mm nonbladed trocar then placed in the left upper quadrant and a camera was placed through this port.  Pelvic organs were now visualized. some small bleeding was noted from the umbilical incision,  a 4 cm hemorrhagic cyst was seen on right ovary,  the uterus was noted to be normal, the left tube was not visualized.   Dr. Zenia Resides from general surgery arrived at this time.  She probably umbilical incision with her fingers but did not find any openings into the peritoneal cavity.  Usually visualization from the left upper quadrant port a nonbladed trocar was now placed through the umbilical incision.  A 10 mm camera was then placed through the umbilicus.  A 5 mm skin incision was made in the right lower quadrant and a nonbladed 5 mm  trocar was placed into the peritoneal cavity.  At this point quick visualization of the bowel did not reveal any obvious perforation and stool leakage or excessive bleeding was noted decision was made to proceed with left salpingectomy.  Initial attempts were made to operate through the left upper quadrant port but given the depth, the decision was then made to place a left lower quadrant port.  After injecting with Marcaine 5 mm skin incision was made and 5 mm nonbladed trocar was placed through the left lower quadrant incision.  A gyrus bipolar device was placed through this left lower quadrant trocar and with deviating the uterus to the right and grasping the distal end of the left fallopian tube with the [place] the mesosalpinx was cauterized and transected.  This was carried out to the proximal edge of the fallopian tube which was then cauterized and transected.  The left fallopian tube with bulging but unruptured ectopic pregnancy was free.  This was then placed in an Endo Catch bag placed through the umbilical incision.  Care was needed to be changed to allow the end of catch bag to  be placed.  The specimen was placed in the Endo Catch bag and removed through the umbilical incision.  Good hemostasis was noted of the left fallopian tube.  The left ovary was intact.  Right hemorrhagic cyst was evaluated and felt to be stable.  Decision was made to not drain the cyst.  Right fallopian tube was carried out to its fimbriated end.  A small half centimeter firm nodule consistent with a fibroma, benign appearing, was noted at the distal right fallopian tube.  This was left intact.  Dr. Zenia Resides took over as the surgeon to fully evaluate the bowel.  This process took approximately 1 hour and was more challenging than usual due to patient's habitus, port placements and resistance of the abdominal wall.   Once we are assured there was no evidence of bowel perforation.  The left adnexa was reevaluated and found to  be hemostatic.  Suction irrigation was carried out.  Excessive fluid was removed from the pelvis though visualization remained difficult due to limitations in obtaining adequate pneumoperitoneum 6 cc of quarter percent Marcaine were placed into the peritoneal cavity.  All instruments were removed, pneumoperitoneum was removed.  Using Army-Navy retractors the umbilical fascia was grasped with Coker clamps and a running 0 Vicryl suture on a UR 6 needle was used to close the fascia.  All skin incisions were closed with 4-0 Vicryl.  The umbilical incision was closed in a running subcuticular suture and the other 5 mm port sites had single figure-of-eight was placed to close the tissue and then were covered with Dermabond  The ZUMI manipulator was removed.  Repeat speculum exam showed no evidence of bleeding and the tenaculum site was hemostatic  Sponge lap and needle counts were correct x3.  The procedure was terminated the patient was taken recovery room in stable addition  Ala Dach 08/11/2020 6:56 PM

## 2020-08-11 NOTE — Anesthesia Preprocedure Evaluation (Addendum)
Anesthesia Evaluation  Patient identified by MRN, date of birth, ID band Patient awake    Reviewed: Allergy & Precautions, NPO status , Patient's Chart, lab work & pertinent test results  History of Anesthesia Complications (+) PONV and history of anesthetic complications  Airway Mallampati: II  TM Distance: >3 FB Neck ROM: Full    Dental no notable dental hx. (+) Teeth Intact, Dental Advisory Given   Pulmonary neg pulmonary ROS,    Pulmonary exam normal breath sounds clear to auscultation       Cardiovascular hypertension, Normal cardiovascular exam Rhythm:Regular Rate:Normal     Neuro/Psych  Headaches, PSYCHIATRIC DISORDERS Anxiety Depression Has taken xanax in past   GI/Hepatic negative GI ROS, Neg liver ROS,   Endo/Other  Hypothyroidism   Renal/GU negative Renal ROS  negative genitourinary   Musculoskeletal negative musculoskeletal ROS (+)   Abdominal (+) + obese,   Peds  Hematology negative hematology ROS (+) hct 35.9   Anesthesia Other Findings   Reproductive/Obstetrics negative OB ROS                            Anesthesia Physical Anesthesia Plan  ASA: 2  Anesthesia Plan: General   Post-op Pain Management:    Induction: Intravenous  PONV Risk Score and Plan: 4 or greater and Ondansetron, Aprepitant, Dexamethasone, Midazolam, Scopolamine patch - Pre-op, Diphenhydramine and Treatment may vary due to age or medical condition  Airway Management Planned: Oral ETT  Additional Equipment: None  Intra-op Plan:   Post-operative Plan: Extubation in OR  Informed Consent: I have reviewed the patients History and Physical, chart, labs and discussed the procedure including the risks, benefits and alternatives for the proposed anesthesia with the patient or authorized representative who has indicated his/her understanding and acceptance.     Dental advisory given  Plan Discussed  with: CRNA  Anesthesia Plan Comments:         Anesthesia Quick Evaluation

## 2020-08-11 NOTE — Anesthesia Procedure Notes (Signed)
Procedure Name: Intubation Date/Time: 08/11/2020 3:14 PM Performed by: Alain Marion, CRNA Pre-anesthesia Checklist: Patient identified, Emergency Drugs available, Suction available and Patient being monitored Patient Re-evaluated:Patient Re-evaluated prior to induction Oxygen Delivery Method: Circle system utilized Preoxygenation: Pre-oxygenation with 100% oxygen Induction Type: IV induction Ventilation: Mask ventilation without difficulty Laryngoscope Size: Miller and 2 Grade View: Grade I Tube type: Oral Tube size: 7.0 mm Number of attempts: 1 Airway Equipment and Method: Stylet and Oral airway Placement Confirmation: ETT inserted through vocal cords under direct vision, positive ETCO2 and breath sounds checked- equal and bilateral Secured at: 22 cm Tube secured with: Tape Dental Injury: Teeth and Oropharynx as per pre-operative assessment

## 2020-08-11 NOTE — Brief Op Note (Signed)
08/11/2020  6:28 PM  PATIENT:  Diana Avery  30 y.o. female  PRE-OPERATIVE DIAGNOSIS:  Live Left Ectopic Pregnancy  POST-OPERATIVE DIAGNOSIS:  Live Left Ectopic Pregnancy, right hemorrhagic ovarian cyst  PROCEDURE:  Procedure(s): LAPAROSCOPY OPERATIVE/Left Salpingectomy, (Left) Exploration of bowel, diagnostic  SURGEON:  Surgeon(s) and Role:    Aloha Gell, MD - Primary    * Dwan Bolt, MD - Assisting   PHYSICIAN ASSISTANT: Derrell Lolling, CNM  ASSISTANTS: As above  ANESTHESIA:   local and general  EBL:  100 mL   BLOOD ADMINISTERED:none  DRAINS: Urinary Catheter (Foley)   LOCAL MEDICATIONS USED:  MARCAINE     SPECIMEN: Left fallopian tube with ectopic pregnancy  DISPOSITION OF SPECIMEN:  PATHOLOGY  COUNTS:  YES  TOURNIQUET:  * No tourniquets in log *  DICTATION: .Dragon Dictation  PLAN OF CARE: Discharge to home after PACU  PATIENT DISPOSITION:  PACU - hemodynamically stable.   Delay start of Pharmacological VTE agent (>24hrs) due to surgical blood loss or risk of bleeding: yes

## 2020-08-11 NOTE — H&P (Addendum)
Diana Avery is an 30 y.o. female MF, send from office for emergency laparoscopy for left tubal ectopic pregnancy.   [id number], h/o 5 SABs. Now unplanned but desired pregnancy. She was seen in office for early ultrasound at 6.6 wks by LMP due to SAB hx and has upcoming travel to [place] in 2 days.  She had quant HCG on 7/15 and 7/18, that went up from 1194 to 2788 in 72 hrs. Denies pain/ vag bleeding. A(+)  Office sono today- NO intrauterine pregnancy. Left adnexal well defined gestational sac and yolk sac and fetal heart rate of 90, measures 5.5 wks by g.sac size. Normal both ovaries. No pelvic fluid.   Patient's last menstrual period was 06/24/2020. Nl Paps.    Past Medical History:  Diagnosis Date   Anemia    Anxiety    Back pain    Essential hypertension    Fatty liver    GAD (generalized anxiety disorder)    Gallbladder problem    History of kidney stones    History of recurrent miscarriages    Hyperlipidemia    Hypothyroidism    Synthroid  - once daily    Infertility, female    Lower extremity edema    Metabolic syndrome    Obesity    Panic attacks    not treated   PCOS (polycystic ovarian syndrome)    PONV (postoperative nausea and vomiting)    Pre-diabetes    Prediabetes    Recurrent major depressive disorder (HCC)    SOB (shortness of breath)     Past Surgical History:  Procedure Laterality Date   air sculpt     Bra and pubic fat removal   DILATION AND CURETTAGE OF UTERUS     IUD REMOVAL  08/18/2014   Mirena   WISDOM TOOTH EXTRACTION      Family History  Problem Relation Age of Onset   Heart disease Father    Kidney disease Father    Hypertension Father    Hyperlipidemia Father    Depression Father    Anxiety disorder Father    Schizophrenia Father    Liver disease Father    Drug abuse Father    Depression Mother    Anxiety disorder Mother    Obesity Mother    Cancer Maternal Grandmother        multiple myleoma   Colon polyps Maternal  Grandmother    Heart failure Maternal Grandmother    Heart disease Maternal Grandfather    Heart disease Paternal Grandfather    Stroke Paternal Grandmother     Social History:  reports that she has never smoked. She has never used smokeless tobacco. She reports previous alcohol use. She reports that she does not use drugs.  Allergies: No Known Allergies  Medications Prior to Admission  Medication Sig Dispense Refill Last Dose   hydrochlorothiazide (HYDRODIURIL) 25 MG tablet Take 1 tablet (25 mg total) by mouth daily. 90 tablet 3 Past Week   levothyroxine (SYNTHROID) 150 MCG tablet Take 1 tablet (150 mcg total) by mouth daily before breakfast. 90 tablet 2 08/11/2020 at 0630   levothyroxine (SYNTHROID) 25 MCG tablet TAKE 1 TABLET (25 MCG TOTAL) BY MOUTH DAILY. TAKE WITH LEVOTHYROXINE 150 FOR A TOTAL OF 175 MCG 30 tablet 0 08/11/2020 at 0630   lisdexamfetamine (VYVANSE) 30 MG capsule Take 1 capsule (30 mg total) by mouth daily. 30 capsule 0 08/10/2020   Multiple Vitamins-Minerals (HAIR SKIN AND NAILS FORMULA PO) Take by mouth.  08/10/2020   ondansetron (ZOFRAN ODT) 4 MG disintegrating tablet Take 1 tablet (4 mg total) by mouth every 8 (eight) hours as needed for nausea or vomiting. 20 tablet 0 Past Week   Semaglutide,0.25 or 0.5MG /DOS, (OZEMPIC, 0.25 OR 0.5 MG/DOSE,) 2 MG/1.5ML SOPN Inject 0.5 mg into the skin once a week. 1.5 mL 0 Past Week   Vitamin D, Ergocalciferol, (DRISDOL) 1.25 MG (50000 UNIT) CAPS capsule Take 1 capsule (50,000 Units total) by mouth every 7 (seven) days. 4 capsule 0 Past Week   buPROPion (WELLBUTRIN XL) 300 MG 24 hr tablet Take 1 tablet (300 mg total) by mouth daily. 90 tablet 3 More than a month   fluticasone (FLONASE) 50 MCG/ACT nasal spray Place into both nostrils.   More than a month    Review of Systems  Blood pressure 136/73, pulse 85, temperature 98.5 F (36.9 C), temperature source Oral, resp. rate 18, last menstrual period 06/24/2020, SpO2 98 %, unknown if  currently breastfeeding. Physical Exam Physical exam:  A&O x 3, no acute distress. Pleasant HEENT neg, no thyromegaly Lungs CTA bilat CV RRR, S1S2 normal Abdo soft, non tender, non acute Extr no edema/ tenderness Pelvic exam deferred today due to risk of rupturing pregnancy.    Results for orders placed or performed during the hospital encounter of 08/11/20 (from the past 24 hour(s))  Glucose, capillary     Status: None   Collection Time: 08/11/20 12:26 PM  Result Value Ref Range   Glucose-Capillary 85 70 - 99 mg/dL    No results found.  Assessment/Plan: 30 yo [id number], with 5.5 wks unruptured live ectopic pregnancy. Patient is stable but high risk of rupture and morbidity and mortality from ectopic pregnancy. All options for ectopic pregnancy were discussed in office incl surgery versus medical management but medical management was not advised due to live ectopic. She also prefers to have surgery, which means laparoscopy and left salpingectomy.  Loss of one tube and affect of fertility was discussed and she understands.  Risks/complications of surgery reviewed incl infection, bleeding, damage to internal organs including bladder, bowels, ureters, blood vessels, other risks from anesthesia, VTE and delayed complications of any surgery, complications in future surgery reviewed.  I am in office all day and she is aware her surgery will be done by Dr Pamala Hurry, who is our on call MD for the practice.   Elveria Royals 08/11/2020, 12:50 PM  H&P reviewed by me.  In brief unplanned but desired pregnancy at about 5 and half weeks.  Patient with cramping starting last week which has been resolved mostly by Tylenol.  Some small brown spotting.  Borderline rise to hCG and ultrasound today with 2 x 1.6 x 1.3 cm live ectopic with yolk sac and fetal pole and heartbeat of 90s in the left adnexa.  Vitals reviewed exam with soft abdomen and pelvic exam deferred.  Skin warm and dry.  General no distress  blood type a positive, no abnormal antibodies.  Medical history significant for obesity hypothyroidism hypertension and anxiety.  Assessment and plan right ectopic in need of urgent left salpingectomy.  We will attempt laparoscopic salpingectomy.  Risks and benefits reviewed with patient who consents to blood in the event of an emergency 1 million risks of HIV and hepatitis reviewed with patient with blood transfusion  Ala Dach 08/11/2020 2:33 PM

## 2020-08-12 ENCOUNTER — Encounter (HOSPITAL_COMMUNITY): Payer: Self-pay | Admitting: Obstetrics

## 2020-08-13 LAB — SURGICAL PATHOLOGY

## 2020-08-16 ENCOUNTER — Ambulatory Visit (INDEPENDENT_AMBULATORY_CARE_PROVIDER_SITE_OTHER): Payer: BC Managed Care – PPO | Admitting: Family Medicine

## 2020-08-25 ENCOUNTER — Other Ambulatory Visit: Payer: Self-pay | Admitting: Family

## 2020-08-25 DIAGNOSIS — F411 Generalized anxiety disorder: Secondary | ICD-10-CM

## 2020-08-25 DIAGNOSIS — F331 Major depressive disorder, recurrent, moderate: Secondary | ICD-10-CM

## 2020-08-27 ENCOUNTER — Other Ambulatory Visit (INDEPENDENT_AMBULATORY_CARE_PROVIDER_SITE_OTHER): Payer: Self-pay | Admitting: Family Medicine

## 2020-08-27 DIAGNOSIS — R7989 Other specified abnormal findings of blood chemistry: Secondary | ICD-10-CM

## 2020-09-09 ENCOUNTER — Other Ambulatory Visit: Payer: Self-pay

## 2020-09-09 ENCOUNTER — Ambulatory Visit (INDEPENDENT_AMBULATORY_CARE_PROVIDER_SITE_OTHER): Payer: BC Managed Care – PPO | Admitting: Family Medicine

## 2020-09-09 ENCOUNTER — Encounter (INDEPENDENT_AMBULATORY_CARE_PROVIDER_SITE_OTHER): Payer: Self-pay | Admitting: Family Medicine

## 2020-09-09 VITALS — BP 118/79 | HR 80 | Temp 97.9°F | Ht 63.0 in | Wt 256.0 lb

## 2020-09-09 DIAGNOSIS — E038 Other specified hypothyroidism: Secondary | ICD-10-CM

## 2020-09-09 DIAGNOSIS — R7989 Other specified abnormal findings of blood chemistry: Secondary | ICD-10-CM

## 2020-09-09 DIAGNOSIS — E559 Vitamin D deficiency, unspecified: Secondary | ICD-10-CM | POA: Diagnosis not present

## 2020-09-09 DIAGNOSIS — R7301 Impaired fasting glucose: Secondary | ICD-10-CM | POA: Diagnosis not present

## 2020-09-09 DIAGNOSIS — F5081 Binge eating disorder: Secondary | ICD-10-CM

## 2020-09-09 DIAGNOSIS — Z9189 Other specified personal risk factors, not elsewhere classified: Secondary | ICD-10-CM

## 2020-09-09 DIAGNOSIS — Z6841 Body Mass Index (BMI) 40.0 and over, adult: Secondary | ICD-10-CM

## 2020-09-09 MED ORDER — LISDEXAMFETAMINE DIMESYLATE 30 MG PO CAPS: 30.0000 mg | ORAL_CAPSULE | Freq: Every day | ORAL | 0 refills | Status: DC

## 2020-09-09 MED ORDER — LEVOTHYROXINE SODIUM 100 MCG PO TABS: 100.0000 ug | ORAL_TABLET | Freq: Every day | ORAL | 0 refills | Status: DC

## 2020-09-09 MED ORDER — LEVOTHYROXINE SODIUM 88 MCG PO TABS: 88.0000 ug | ORAL_TABLET | Freq: Every day | ORAL | 0 refills | Status: DC

## 2020-09-09 MED ORDER — OZEMPIC (1 MG/DOSE) 4 MG/3ML ~~LOC~~ SOPN: 1.0000 mg | PEN_INJECTOR | SUBCUTANEOUS | 0 refills | Status: DC

## 2020-09-09 MED ORDER — VITAMIN D (ERGOCALCIFEROL) 1.25 MG (50000 UNIT) PO CAPS: 50000.0000 [IU] | ORAL_CAPSULE | ORAL | 0 refills | Status: DC

## 2020-09-13 NOTE — Progress Notes (Signed)
Chief Complaint:   OBESITY Diana Avery is here to discuss her progress with her obesity treatment plan along with follow-up of her obesity related diagnoses.   Today's visit was #: 4 Starting weight: 250 lbs Starting date: 06/08/2020 Today's weight: 256 lbs Today's date: 09/09/2020 Weight change since last visit: +11 lbs Total lbs lost to date: +6 pounds Body mass index is 45.35 kg/m.   Current Meal Plan: the Category 3 Plan for 40% of the time.  Current Exercise Plan: Walking for 20-30 minutes 4 times per week. Current Anti-Obesity Medications: Ozempic 0.5 mg subcutaneously weekly. Side effects: None.  Interim History:  Diana Avery says her husband will be getting a vasectomy.  They are currently using condoms.  Her TSH is 4.9, free T4 is 1.53.  Assessment/Plan:   1. Other specified hypothyroidism Medication: levothyroxine 175 mcg daily.   Plan: Patient was instructed not to take MVM or iron within 4 hours of taking thyroid medications.  We will continue to monitor alongside Endocrinology/PCP as it relates to her weight loss journey.   Lab Results  Component Value Date   TSH 9.280 (H) 06/08/2020   - Increase levothyroxine (SYNTHROID) 88 MCG tablet; Take 1 tablet (88 mcg total) by mouth daily before breakfast. Take with 100 mcg to equal 188 mcg daily.  Dispense: 30 tablet; Refill: 0 - Increase levothyroxine (SYNTHROID) 100 MCG tablet; Take 1 tablet (100 mcg total) by mouth daily before breakfast.  Dispense: 30 tablet; Refill: 0  2. Impaired fasting glucose [name] is taking Ozempic 0.5 mg subcutaneously weekly.  Will increase to 1 mg subcutaneously weekly, as per below.  - Increase and refill Semaglutide, 1 MG/DOSE, (OZEMPIC, 1 MG/DOSE,) 4 MG/3ML SOPN; Inject 1 mg into the skin once a week.  Dispense: 3 mL; Refill: 0  3. Vitamin D deficiency Not at goal.  She is taking vitamin D 50,000 IU weekly.  Plan: Continue to take prescription Vitamin D '@50'$ ,000 IU every week as prescribed.   Follow-up for routine testing of Vitamin D, at least 2-3 times per year to avoid over-replacement.  Lab Results  Component Value Date   VD25OH 25.2 (L) 06/08/2020   - Refill Vitamin D, Ergocalciferol, (DRISDOL) 1.25 MG (50000 UNIT) CAPS capsule; Take 1 capsule (50,000 Units total) by mouth every 7 (seven) days.  Dispense: 4 capsule; Refill: 0  4. Binge eating disorder [name] is taking Vyvanse 30 mg daily for BED.  The current medical regimen is effective;  continue present plan and medications.  I have consulted the [place] Controlled Substances Registry for this patient, and feel the risk/benefit ratio today is favorable for proceeding with this prescription for a controlled substance. The patient understands monitoring parameters and red flags.   - Refill lisdexamfetamine (VYVANSE) 30 MG capsule; Take 1 capsule (30 mg total) by mouth daily.  Dispense: 30 capsule; Refill: 0  5. At risk for heart disease Due to Diana Avery's current state of health and medical condition(s), she is at a higher risk for heart disease.  This puts the patient at much greater risk to subsequently develop cardiopulmonary conditions that can significantly affect patient's quality of life in a negative manner.    At least 8 minutes were spent on counseling Diana Avery about these concerns today. Evidence-based interventions for health behavior change were utilized today including the discussion of self monitoring techniques, problem-solving barriers, and SMART goal setting techniques.  Specifically, regarding patient's less desirable eating habits and patterns, we employed the technique of small changes when [employer]  has not been able to fully commit to her prudent nutritional plan.  6. Obesity, current BMI 45.5  Course: Diana Avery is currently in the action stage of change. As such, her goal is to continue with weight loss efforts.   Nutrition goals: She has agreed to the Category 3 Plan.   Exercise goals:  As is.  Behavioral  modification strategies: increasing lean protein intake, decreasing simple carbohydrates, increasing vegetables, and increasing water intake.  Diana Avery has agreed to follow-up with our clinic in 3 weeks. She was informed of the importance of frequent follow-up visits to maximize her success with intensive lifestyle modifications for her multiple health conditions.   Objective:   Blood pressure 118/79, pulse 80, temperature 97.9 F (36.6 C), temperature source Oral, height '5\' 3"'$  (1.6 m), weight 256 lb (116.1 kg), SpO2 98 %, unknown if currently breastfeeding. Body mass index is 45.35 kg/m.  General: Cooperative, alert, well developed, in no acute distress. HEENT: Conjunctivae and lids unremarkable. Cardiovascular: Regular rhythm.  Lungs: Normal work of breathing. Neurologic: No focal deficits.   Lab Results  Component Value Date   CREATININE 0.66 08/11/2020   BUN 8 08/11/2020   NA 137 08/11/2020   K 3.8 08/11/2020   CL 109 08/11/2020   CO2 23 08/11/2020   Lab Results  Component Value Date   ALT 72 (H) 06/08/2020   AST 28 06/08/2020   ALKPHOS 78 06/08/2020   BILITOT 0.3 06/08/2020   Lab Results  Component Value Date   HGBA1C 5.7 (H) 06/08/2020   HGBA1C 5.5 11/30/2014   Lab Results  Component Value Date   INSULIN 20.0 06/08/2020   Lab Results  Component Value Date   TSH 9.280 (H) 06/08/2020   Lab Results  Component Value Date   CHOL 176 06/08/2020   HDL 43 06/08/2020   LDLCALC 109 (H) 06/08/2020   TRIG 132 06/08/2020   CHOLHDL 4.1 06/08/2020   Lab Results  Component Value Date   VD25OH 25.2 (L) 06/08/2020   Lab Results  Component Value Date   WBC 10.0 08/11/2020   HGB 11.2 (L) 08/11/2020   HCT 35.9 (L) 08/11/2020   MCV 82.0 08/11/2020   PLT 400 08/11/2020   Lab Results  Component Value Date   IRON 31 06/08/2020   TIBC 439 06/08/2020   FERRITIN 15 06/08/2020   Attestation Statements:   Reviewed by clinician on day of visit: allergies, medications,  problem list, medical history, surgical history, family history, social history, and previous encounter notes.  I, Water quality scientist, CMA, am acting as transcriptionist for Briscoe Deutscher, DO  I have reviewed the above documentation for accuracy and completeness, and I agree with the above. Briscoe Deutscher, DO

## 2020-10-02 ENCOUNTER — Other Ambulatory Visit (INDEPENDENT_AMBULATORY_CARE_PROVIDER_SITE_OTHER): Payer: Self-pay | Admitting: Family Medicine

## 2020-10-02 DIAGNOSIS — E038 Other specified hypothyroidism: Secondary | ICD-10-CM

## 2020-10-04 NOTE — Telephone Encounter (Signed)
Last OV with Dr Wallace 

## 2020-10-04 NOTE — Telephone Encounter (Signed)
LAST APPOINTMENT DATE: 09/09/20 NEXT APPOINTMENT DATE: 10/18/20   [id number] [address] [place]Phone: [phone]: [phone] [id number] [place]Phone: [phone]: [phone] Patient is requesting a refill of the following medications: Pending Prescriptions:                       Disp   Refills   levothyroxine (SYNTHROID) 100 MCG tablet [*30 tab*0       Sig: TAKE 1 TABLET BY MOUTH DAILY BEFORE BREAKFAST.   Date last filled: 09/09/20 Previously prescribed by Dr.Wallace  Lab Results      Component                Value               Date                      HGBA1C                   5.7 (H)             06/08/2020                HGBA1C                   5.5                 11/30/2014           Lab Results      Component                Value               Date                      LDLCALC                  109 (H)             06/08/2020                CREATININE               0.66                08/11/2020           Lab Results      Component                Value               Date                      VD25OH                   25.2 (L)            06/08/2020            BP Readings from Last 3 Encounters: 09/09/20 : 118/79 08/11/20 : 135/80 07/14/20 : 116/77

## 2020-10-06 ENCOUNTER — Other Ambulatory Visit (INDEPENDENT_AMBULATORY_CARE_PROVIDER_SITE_OTHER): Payer: Self-pay | Admitting: Family Medicine

## 2020-10-06 DIAGNOSIS — R7989 Other specified abnormal findings of blood chemistry: Secondary | ICD-10-CM

## 2020-10-06 DIAGNOSIS — E038 Other specified hypothyroidism: Secondary | ICD-10-CM

## 2020-10-06 NOTE — Telephone Encounter (Signed)
Pt last seen by Dr. Wallace.  

## 2020-10-07 ENCOUNTER — Encounter (INDEPENDENT_AMBULATORY_CARE_PROVIDER_SITE_OTHER): Payer: Self-pay | Admitting: Family Medicine

## 2020-10-07 ENCOUNTER — Other Ambulatory Visit (INDEPENDENT_AMBULATORY_CARE_PROVIDER_SITE_OTHER): Payer: Self-pay | Admitting: Family Medicine

## 2020-10-07 DIAGNOSIS — F5081 Binge eating disorder: Secondary | ICD-10-CM

## 2020-10-07 MED ORDER — LISDEXAMFETAMINE DIMESYLATE 30 MG PO CAPS: 30.0000 mg | ORAL_CAPSULE | Freq: Every day | ORAL | 0 refills | Status: DC

## 2020-10-07 NOTE — Telephone Encounter (Signed)
Pt last seen by Dr. Wallace.  

## 2020-10-18 ENCOUNTER — Other Ambulatory Visit: Payer: Self-pay

## 2020-10-18 ENCOUNTER — Encounter (INDEPENDENT_AMBULATORY_CARE_PROVIDER_SITE_OTHER): Payer: Self-pay | Admitting: Family Medicine

## 2020-10-18 ENCOUNTER — Ambulatory Visit (INDEPENDENT_AMBULATORY_CARE_PROVIDER_SITE_OTHER): Payer: BC Managed Care – PPO | Admitting: Family Medicine

## 2020-10-18 VITALS — BP 111/71 | HR 74 | Temp 98.2°F | Ht 63.0 in | Wt 251.0 lb

## 2020-10-18 DIAGNOSIS — E559 Vitamin D deficiency, unspecified: Secondary | ICD-10-CM

## 2020-10-18 DIAGNOSIS — E7849 Other hyperlipidemia: Secondary | ICD-10-CM

## 2020-10-18 DIAGNOSIS — E038 Other specified hypothyroidism: Secondary | ICD-10-CM | POA: Diagnosis not present

## 2020-10-18 DIAGNOSIS — R7301 Impaired fasting glucose: Secondary | ICD-10-CM

## 2020-10-18 DIAGNOSIS — F50819 Binge eating disorder, unspecified: Secondary | ICD-10-CM

## 2020-10-18 DIAGNOSIS — Z9189 Other specified personal risk factors, not elsewhere classified: Secondary | ICD-10-CM | POA: Diagnosis not present

## 2020-10-18 DIAGNOSIS — R5383 Other fatigue: Secondary | ICD-10-CM

## 2020-10-18 DIAGNOSIS — Z6841 Body Mass Index (BMI) 40.0 and over, adult: Secondary | ICD-10-CM

## 2020-10-18 DIAGNOSIS — E66813 Obesity, class 3: Secondary | ICD-10-CM

## 2020-10-18 DIAGNOSIS — F5081 Binge eating disorder: Secondary | ICD-10-CM

## 2020-10-18 MED ORDER — LEVOTHYROXINE SODIUM 100 MCG PO TABS: 100.0000 ug | ORAL_TABLET | Freq: Every day | ORAL | 0 refills | Status: DC

## 2020-10-18 MED ORDER — VITAMIN D (ERGOCALCIFEROL) 1.25 MG (50000 UNIT) PO CAPS: 50000.0000 [IU] | ORAL_CAPSULE | ORAL | 0 refills | Status: DC

## 2020-10-18 MED ORDER — LEVOTHYROXINE SODIUM 88 MCG PO TABS: 88.0000 ug | ORAL_TABLET | Freq: Every day | ORAL | 0 refills | Status: DC

## 2020-10-18 MED ORDER — ONDANSETRON 8 MG PO TBDP: 8.0000 mg | ORAL_TABLET | Freq: Two times a day (BID) | ORAL | 0 refills | Status: DC | PRN

## 2020-10-18 MED ORDER — LISDEXAMFETAMINE DIMESYLATE 30 MG PO CAPS: 30.0000 mg | ORAL_CAPSULE | Freq: Every day | ORAL | 0 refills | Status: DC

## 2020-10-18 MED ORDER — OZEMPIC (1 MG/DOSE) 4 MG/3ML ~~LOC~~ SOPN: 1.0000 mg | PEN_INJECTOR | SUBCUTANEOUS | 0 refills | Status: DC

## 2020-10-18 NOTE — Progress Notes (Signed)
Chief Complaint:   OBESITY Diana Avery is here to discuss her progress with her obesity treatment plan along with follow-up of her obesity related diagnoses.   Today's visit was #: 5 Starting weight: 250 lbs Starting date: 06/08/2020 Today's weight: 251 lbs Today's date: 10/18/2020 Weight change since last visit: 5 lbs  Total lbs lost to date: +1 lb Body mass index is 44.46 kg/m.   Current Meal Plan: the Category 3 Plan for 70% of the time.  Current Exercise Plan: Walking for 30-60 minutes 5 times per week. Current Anti-Obesity Medications: Ozempic 1 mg subcutaneously weekly. Side effects: None.  Interim History:  Latika stopped iced coffee and replaced it with Premier.  She is drinking mostly water now.  She just increased her dose of Ozempic to 1 mg yesterday.  She is commuting to and from work and eating lunch with her husband, which provides Camera operator.  Assessment/Plan:   1. Other specified hypothyroidism Medication: levothyroxine 188 mcg daily.   Plan:  Continue levothyroxine.  Will refill today.   Patient was instructed not to take MVM or iron within 4 hours of taking thyroid medications. We will continue to monitor alongside Endocrinology/PCP as it relates to her weight loss journey. TSH and free T4 will be checked today.   Lab Results  Component Value Date   TSH 9.280 (H) 06/08/2020   - Refill levothyroxine (SYNTHROID) 100 MCG tablet; Take 1 tablet (100 mcg total) by mouth daily before breakfast.  Dispense: 30 tablet; Refill: 0 - Refill levothyroxine (SYNTHROID) 88 MCG tablet; Take 1 tablet (88 mcg total) by mouth daily before breakfast. TAKE WITH 100 MCG TO EQUAL 188 MCG DAILY.  Dispense: 30 tablet; Refill: 0  2. Impaired fasting glucose Continue Ozempic 1 mg subcutaneously weekly.  Will check labs today.  - Refill Semaglutide, 1 MG/DOSE, (OZEMPIC, 1 MG/DOSE,) 4 MG/3ML SOPN; Inject 1 mg into the skin once a week.  Dispense: 3 mL; Refill: 0 - Hemoglobin  A1c - Insulin, random  3. Vitamin D deficiency Not at goal.  She is taking vitamin D 50,000 IU weekly.  Plan: Continue to take prescription Vitamin D @50 ,000 IU every week as prescribed.  Will check vitamin D level today.  Lab Results  Component Value Date   VD25OH 25.2 (L) 06/08/2020   - Refill Vitamin D, Ergocalciferol, (DRISDOL) 1.25 MG (50000 UNIT) CAPS capsule; Take 1 capsule (50,000 Units total) by mouth every 7 (seven) days.  Dispense: 4 capsule; Refill: 0 - VITAMIN D 25 Hydroxy (Vit-D Deficiency, Fractures)  4. Other fatigue Will check labs today, as per below.  - Anemia panel - CBC with Differential/Platelet - Comprehensive metabolic panel - TSH - T4, free  5. Other hyperlipidemia Course: Not optimized. Lipid-lowering medications: None.   Plan: Dietary changes: Increase soluble fiber, decrease simple carbohydrates, decrease saturated fat. Exercise changes: Moderate to vigorous-intensity aerobic activity 150 minutes per week or as tolerated. We will continue to monitor along with PCP/specialists as it pertains to her weight loss journey.  Will check lipid panel today.  Lab Results  Component Value Date   CHOL 176 06/08/2020   HDL 43 06/08/2020   LDLCALC 109 (H) 06/08/2020   TRIG 132 06/08/2020   CHOLHDL 4.1 06/08/2020   Lab Results  Component Value Date   ALT 72 (H) 06/08/2020   AST 28 06/08/2020   ALKPHOS 78 06/08/2020   BILITOT 0.3 06/08/2020   - Lipid panel  6. Binge eating disorder Dekayla is taking Vyvanse 30  mg daily for BED.  The current medical regimen is effective;  continue present plan and medications.  - Refill lisdexamfetamine (VYVANSE) 30 MG capsule; Take 1 capsule (30 mg total) by mouth daily.  Dispense: 30 capsule; Refill: 0  I have consulted the [place] Controlled Substances Registry for this patient, and feel the risk/benefit ratio today is favorable for proceeding with this prescription for a controlled substance. The patient understands  monitoring parameters and red flags.   7. At risk for heart disease Due to Meztli's current state of health and medical condition(s), she is at a higher risk for heart disease.  This puts the patient at much greater risk to subsequently develop cardiopulmonary conditions that can significantly affect patient's quality of life in a negative manner.    At least 8 minutes were spent on counseling Ivelise about these concerns today. Evidence-based interventions for health behavior change were utilized today including the discussion of self monitoring techniques, problem-solving barriers, and SMART goal setting techniques.  Specifically, regarding patient's less desirable eating habits and patterns, we employed the technique of small changes when Delainey has not been able to fully commit to her prudent nutritional plan.  8. Obesity, current BMI 44.6  Course: Hugh is currently in the action stage of change. As such, her goal is to continue with weight loss efforts.   Nutrition goals: She has agreed to the Category 3 Plan.   Exercise goals:  As is.  Behavioral modification strategies: increasing lean protein intake, decreasing simple carbohydrates, increasing vegetables, and increasing water intake.  Kimberely has agreed to follow-up with our clinic in 4 weeks. She was informed of the importance of frequent follow-up visits to maximize her success with intensive lifestyle modifications for her multiple health conditions.   Objective:   Blood pressure 111/71, pulse 74, temperature 98.2 F (36.8 C), temperature source Oral, height 5\' 3"  (1.6 m), weight 251 lb (113.9 kg), SpO2 97 %, unknown if currently breastfeeding. Body mass index is 44.46 kg/m.  General: Cooperative, alert, well developed, in no acute distress. HEENT: Conjunctivae and lids unremarkable. Cardiovascular: Regular rhythm.  Lungs: Normal work of breathing. Neurologic: No focal deficits.   Lab Results  Component Value Date    CREATININE 0.66 08/11/2020   BUN 8 08/11/2020   NA 137 08/11/2020   K 3.8 08/11/2020   CL 109 08/11/2020   CO2 23 08/11/2020   Lab Results  Component Value Date   ALT 72 (H) 06/08/2020   AST 28 06/08/2020   ALKPHOS 78 06/08/2020   BILITOT 0.3 06/08/2020   Lab Results  Component Value Date   HGBA1C 5.7 (H) 06/08/2020   HGBA1C 5.5 11/30/2014   Lab Results  Component Value Date   INSULIN 20.0 06/08/2020   Lab Results  Component Value Date   TSH 9.280 (H) 06/08/2020   Lab Results  Component Value Date   CHOL 176 06/08/2020   HDL 43 06/08/2020   LDLCALC 109 (H) 06/08/2020   TRIG 132 06/08/2020   CHOLHDL 4.1 06/08/2020   Lab Results  Component Value Date   VD25OH 25.2 (L) 06/08/2020   Lab Results  Component Value Date   WBC 10.0 08/11/2020   HGB 11.2 (L) 08/11/2020   HCT 35.9 (L) 08/11/2020   MCV 82.0 08/11/2020   PLT 400 08/11/2020   Lab Results  Component Value Date   IRON 31 06/08/2020   TIBC 439 06/08/2020   FERRITIN 15 06/08/2020   Attestation Statements:   Reviewed by clinician on day  of visit: allergies, medications, problem list, medical history, surgical history, family history, social history, and previous encounter notes.  I, Water quality scientist, CMA, am acting as transcriptionist for Briscoe Deutscher, DO  I have reviewed the above documentation for accuracy and completeness, and I agree with the above. Briscoe Deutscher, DO

## 2020-10-19 LAB — CBC WITH DIFFERENTIAL/PLATELET
Basophils Absolute: 0 10*3/uL (ref 0.0–0.2)
Basos: 1 %
EOS (ABSOLUTE): 0.2 10*3/uL (ref 0.0–0.4)
Eos: 3 %
Hemoglobin: 12.5 g/dL (ref 11.1–15.9)
Immature Grans (Abs): 0 10*3/uL (ref 0.0–0.1)
Immature Granulocytes: 0 %
Lymphocytes Absolute: 1.4 10*3/uL (ref 0.7–3.1)
Lymphs: 22 %
MCH: 25.6 pg — ABNORMAL LOW (ref 26.6–33.0)
MCHC: 32.1 g/dL (ref 31.5–35.7)
MCV: 80 fL (ref 79–97)
Monocytes Absolute: 0.7 10*3/uL (ref 0.1–0.9)
Monocytes: 11 %
Neutrophils Absolute: 4.1 10*3/uL (ref 1.4–7.0)
Neutrophils: 63 %
Platelets: 379 10*3/uL (ref 150–450)
RBC: 4.88 x10E6/uL (ref 3.77–5.28)
RDW: 14.8 % (ref 11.7–15.4)
WBC: 6.4 10*3/uL (ref 3.4–10.8)

## 2020-10-19 LAB — COMPREHENSIVE METABOLIC PANEL
ALT: 19 IU/L (ref 0–32)
AST: 19 IU/L (ref 0–40)
Albumin/Globulin Ratio: 1.5 (ref 1.2–2.2)
Albumin: 4.4 g/dL (ref 3.8–4.8)
Alkaline Phosphatase: 81 IU/L (ref 44–121)
BUN/Creatinine Ratio: 15 (ref 9–23)
BUN: 9 mg/dL (ref 6–20)
Bilirubin Total: 0.4 mg/dL (ref 0.0–1.2)
CO2: 22 mmol/L (ref 20–29)
Calcium: 9 mg/dL (ref 8.7–10.2)
Chloride: 102 mmol/L (ref 96–106)
Creatinine, Ser: 0.62 mg/dL (ref 0.57–1.00)
Globulin, Total: 2.9 g/dL (ref 1.5–4.5)
Glucose: 82 mg/dL (ref 70–99)
Potassium: 3.9 mmol/L (ref 3.5–5.2)
Sodium: 141 mmol/L (ref 134–144)
Total Protein: 7.3 g/dL (ref 6.0–8.5)
eGFR: 122 mL/min/{1.73_m2} (ref >59)

## 2020-10-19 LAB — ANEMIA PANEL
Ferritin: 26 ng/mL (ref 15–150)
Folate, Hemolysate: 335 ng/mL
Folate, RBC: 861 ng/mL (ref >498)
Hematocrit: 38.9 % (ref 34.0–46.6)
Iron Saturation: 10 % — ABNORMAL LOW (ref 15–55)
Iron: 41 ug/dL (ref 27–159)
Retic Ct Pct: 1.3 % (ref 0.6–2.6)
Total Iron Binding Capacity: 402 ug/dL (ref 250–450)
UIBC: 361 ug/dL (ref 131–425)
Vitamin B-12: 489 pg/mL (ref 232–1245)

## 2020-10-19 LAB — HEMOGLOBIN A1C
Est. average glucose Bld gHb Est-mCnc: 117 mg/dL
Hgb A1c MFr Bld: 5.7 % — ABNORMAL HIGH (ref 4.8–5.6)

## 2020-10-19 LAB — INSULIN, RANDOM: INSULIN: 15.8 u[IU]/mL (ref 2.6–24.9)

## 2020-10-19 LAB — LIPID PANEL
Chol/HDL Ratio: 4.1 ratio (ref 0.0–4.4)
Cholesterol, Total: 167 mg/dL (ref 100–199)
HDL: 41 mg/dL (ref >39)
LDL Chol Calc (NIH): 113 mg/dL — ABNORMAL HIGH (ref 0–99)
Triglycerides: 68 mg/dL (ref 0–149)
VLDL Cholesterol Cal: 13 mg/dL (ref 5–40)

## 2020-10-19 LAB — VITAMIN D 25 HYDROXY (VIT D DEFICIENCY, FRACTURES): Vit D, 25-Hydroxy: 43 ng/mL (ref 30.0–100.0)

## 2020-10-19 LAB — TSH: TSH: 0.517 u[IU]/mL (ref 0.450–4.500)

## 2020-10-19 LAB — T4, FREE: Free T4: 1.77 ng/dL (ref 0.82–1.77)

## 2020-10-31 ENCOUNTER — Other Ambulatory Visit (INDEPENDENT_AMBULATORY_CARE_PROVIDER_SITE_OTHER): Payer: Self-pay | Admitting: Family Medicine

## 2020-10-31 DIAGNOSIS — E038 Other specified hypothyroidism: Secondary | ICD-10-CM

## 2020-11-06 ENCOUNTER — Other Ambulatory Visit: Payer: Self-pay | Admitting: Family

## 2020-11-17 ENCOUNTER — Other Ambulatory Visit (INDEPENDENT_AMBULATORY_CARE_PROVIDER_SITE_OTHER): Payer: Self-pay | Admitting: Family Medicine

## 2020-11-17 ENCOUNTER — Ambulatory Visit (INDEPENDENT_AMBULATORY_CARE_PROVIDER_SITE_OTHER): Payer: BC Managed Care – PPO | Admitting: Family Medicine

## 2020-11-17 ENCOUNTER — Other Ambulatory Visit: Payer: Self-pay

## 2020-11-17 ENCOUNTER — Encounter (INDEPENDENT_AMBULATORY_CARE_PROVIDER_SITE_OTHER): Payer: Self-pay | Admitting: Family Medicine

## 2020-11-17 VITALS — BP 110/73 | HR 78 | Temp 98.1°F | Ht 63.0 in | Wt 240.0 lb

## 2020-11-17 DIAGNOSIS — E038 Other specified hypothyroidism: Secondary | ICD-10-CM

## 2020-11-17 DIAGNOSIS — E559 Vitamin D deficiency, unspecified: Secondary | ICD-10-CM

## 2020-11-17 DIAGNOSIS — F5081 Binge eating disorder: Secondary | ICD-10-CM

## 2020-11-17 DIAGNOSIS — Z6841 Body Mass Index (BMI) 40.0 and over, adult: Secondary | ICD-10-CM

## 2020-11-17 MED ORDER — ONDANSETRON 8 MG PO TBDP: 8.0000 mg | ORAL_TABLET | Freq: Two times a day (BID) | ORAL | 0 refills | Status: DC | PRN

## 2020-11-17 MED ORDER — LEVOTHYROXINE SODIUM 100 MCG PO TABS: ORAL_TABLET | ORAL | 0 refills | Status: DC

## 2020-11-17 MED ORDER — LEVOTHYROXINE SODIUM 88 MCG PO TABS: 88.0000 ug | ORAL_TABLET | Freq: Every day | ORAL | 0 refills | Status: DC

## 2020-11-17 MED ORDER — VITAMIN D (ERGOCALCIFEROL) 1.25 MG (50000 UNIT) PO CAPS: 50000.0000 [IU] | ORAL_CAPSULE | ORAL | 0 refills | Status: DC

## 2020-11-17 MED ORDER — VYVANSE 60 MG PO CHEW: 30.0000 mg | CHEWABLE_TABLET | Freq: Two times a day (BID) | ORAL | 0 refills | Status: DC

## 2020-11-22 NOTE — Progress Notes (Signed)
Chief Complaint:   OBESITY Diana Avery is here to discuss her progress with her obesity treatment plan along with follow-up of her obesity related diagnoses. See Medical Weight Management Flowsheet for complete bioelectrical impedance results.  Today's visit was #: 6 Starting weight: 250 lbs Starting date: 06/08/2020 Weight change since last visit: 11 lbs Total lbs lost to date: 10 lbs Total weight loss percentage to date: -4.00%  Nutrition Plan: Category 3 Meal plan for 70% of the time. Activity: Walking/cardio for 20-30 minutes 3-5 times per week.  Anti-obesity medications: Ozempic 1 mg subcutaneously weekly. Reported side effects: None.  Interim History: Diana Avery says she still struggles with focus and impulse control.  She endorses increased polyphagia in the afternoon.  Assessment/Plan:   1. Vitamin D deficiency Improving, but not optimized.  She is taking vitamin D 50,000 IU weekly.  Plan: Continue to take prescription Vitamin D @50 ,000 IU every week as prescribed.  Follow-up for routine testing of Vitamin D, at least 2-3 times per year to avoid over-replacement.  Lab Results  Component Value Date   VD25OH 43.0 10/18/2020   VD25OH 25.2 (L) 06/08/2020   - Refill Vitamin D, Ergocalciferol, (DRISDOL) 1.25 MG (50000 UNIT) CAPS capsule; Take 1 capsule (50,000 Units total) by mouth every 7 (seven) days.  Dispense: 4 capsule; Refill: 0  2. Other specified hypothyroidism Course: Controlled. Medication: levothyroxine 188 mcg daily.   Plan: Patient was instructed not to take MVM or iron within 4 hours of taking thyroid medications.  We will continue to monitor alongside Endocrinology/PCP as it relates to her weight loss journey.   Lab Results  Component Value Date   TSH 0.517 10/18/2020   - Refill levothyroxine (SYNTHROID) 100 MCG tablet; TAKE 1 TABLET BY MOUTH EVERY DAY BEFORE BREAKFAST  Dispense: 30 tablet; Refill: 0 - Refill levothyroxine (SYNTHROID) 88 MCG tablet; Take 1  tablet (88 mcg total) by mouth daily before breakfast. TAKE WITH 100 MCG TO EQUAL 188 MCG DAILY.  Dispense: 30 tablet; Refill: 0  3. Binge eating disorder Diana Avery is taking Vyvanse 30 mg daily.  She endorses continued struggles with focus and impulse control.  Plan:  Will increase Vyvanse to 30 mg in the morning and 30 mg in the afternoon, as per below.  The goals for treatment of BED are to reduce eating binges and to achieve healthy eating habits. Because binge eating can correlate with negative emotions, treatment may also address any other mental-health issues, such as depression.  People who binge eat feel as if they don't have control over how much they eat and have feelings of guilt or self-loathing after a binge eating episode.  The FDA has approved Vyvanse as a treatment option for binge eating disorder. Vyvanse targets the brain's reward center by increasing the levels of dopamine and norepinephrine, the chemicals of the brain responsible for feelings of pleasure.  Mindful eating is the recommended nutritional approach to treating BED.   - Refill ondansetron (ZOFRAN ODT) 8 MG disintegrating tablet; Take 1 tablet (8 mg total) by mouth every 12 (twelve) hours as needed for nausea or vomiting.  Dispense: 20 tablet; Refill: 0 - Increase Lisdexamfetamine Dimesylate (VYVANSE) 60 MG CHEW; Chew 30 mg by mouth 2 (two) times daily with breakfast and lunch.  Dispense: 30 tablet; Refill: 0  I have consulted the [place] Controlled Substances Registry for this patient, and feel the risk/benefit ratio today is favorable for proceeding with this prescription for a controlled substance. The patient understands monitoring parameters  and red flags.   4. Obesity, current BMI 42.6  Course: Celestine is currently in the action stage of change. As such, her goal is to continue with weight loss efforts.   Nutrition goals: She has agreed to the Category 3 Plan.   Exercise goals: For substantial health benefits,  adults should do at least 150 minutes (2 hours and 30 minutes) a week of moderate-intensity, or 75 minutes (1 hour and 15 minutes) a week of vigorous-intensity aerobic physical activity, or an equivalent combination of moderate- and vigorous-intensity aerobic activity. Aerobic activity should be performed in episodes of at least 10 minutes, and preferably, it should be spread throughout the week.  Behavioral modification strategies: increasing lean protein intake, decreasing simple carbohydrates, increasing vegetables, and increasing water intake.  Diana Avery has agreed to follow-up with our clinic in 4 weeks. She was informed of the importance of frequent follow-up visits to maximize her success with intensive lifestyle modifications for her multiple health conditions.   Objective:   Blood pressure 110/73, pulse 78, temperature 98.1 F (36.7 C), temperature source Oral, height 5\' 3"  (1.6 m), weight 240 lb (108.9 kg), SpO2 98 %, unknown if currently breastfeeding. Body mass index is 42.51 kg/m.  General: Cooperative, alert, well developed, in no acute distress. HEENT: Conjunctivae and lids unremarkable. Cardiovascular: Regular rhythm.  Lungs: Normal work of breathing. Neurologic: No focal deficits.   Lab Results  Component Value Date   CREATININE 0.62 10/18/2020   BUN 9 10/18/2020   NA 141 10/18/2020   K 3.9 10/18/2020   CL 102 10/18/2020   CO2 22 10/18/2020   Lab Results  Component Value Date   ALT 19 10/18/2020   AST 19 10/18/2020   ALKPHOS 81 10/18/2020   BILITOT 0.4 10/18/2020   Lab Results  Component Value Date   HGBA1C 5.7 (H) 10/18/2020   HGBA1C 5.7 (H) 06/08/2020   HGBA1C 5.5 11/30/2014   Lab Results  Component Value Date   INSULIN 15.8 10/18/2020   INSULIN 20.0 06/08/2020   Lab Results  Component Value Date   TSH 0.517 10/18/2020   Lab Results  Component Value Date   CHOL 167 10/18/2020   HDL 41 10/18/2020   LDLCALC 113 (H) 10/18/2020   TRIG 68 10/18/2020    CHOLHDL 4.1 10/18/2020   Lab Results  Component Value Date   VD25OH 43.0 10/18/2020   VD25OH 25.2 (L) 06/08/2020   Lab Results  Component Value Date   WBC 6.4 10/18/2020   HGB 12.5 10/18/2020   HCT 38.9 10/18/2020   MCV 80 10/18/2020   PLT 379 10/18/2020   Lab Results  Component Value Date   IRON 41 10/18/2020   TIBC 402 10/18/2020   FERRITIN 26 10/18/2020   Attestation Statements:   Reviewed by clinician on day of visit: allergies, medications, problem list, medical history, surgical history, family history, social history, and previous encounter notes.  Time spent on visit including pre-visit chart review and post-visit care and documentation was 42 minutes. Time was spent on: Food choices and timing of food intake reviewed today. I discussed a personalized meal plan with the patient that will help her to lose weight and will improve her obesity-related conditions going forward. I performed a medically necessary appropriate examination and/or evaluation. I discussed the assessment and treatment plan with the patient. Motivational interviewing as well as evidence-based interventions for health behavior change were utilized today including the discussion of self monitoring techniques, problem-solving barriers and SMART goal setting techniques.  An  exercise prescription was reviewed.  The patient was provided an opportunity to ask questions and all were answered. The patient agreed with the plan and demonstrated an understanding of the instructions. Clinical information was updated and documented in the EMR.  I, Water quality scientist, CMA, am acting as transcriptionist for Briscoe Deutscher, DO  I have reviewed the above documentation for accuracy and completeness, and I agree with the above. -  Briscoe Deutscher, DO, MS, FAAFP, DABOM - Family and Bariatric Medicine.

## 2020-11-23 ENCOUNTER — Other Ambulatory Visit (INDEPENDENT_AMBULATORY_CARE_PROVIDER_SITE_OTHER): Payer: Self-pay | Admitting: Family Medicine

## 2020-11-23 DIAGNOSIS — R7301 Impaired fasting glucose: Secondary | ICD-10-CM

## 2020-11-23 MED ORDER — OZEMPIC (1 MG/DOSE) 4 MG/3ML ~~LOC~~ SOPN: 1.0000 mg | PEN_INJECTOR | SUBCUTANEOUS | 0 refills | Status: DC

## 2020-11-23 NOTE — Telephone Encounter (Signed)
Dr.Wallace °

## 2020-11-25 ENCOUNTER — Encounter (INDEPENDENT_AMBULATORY_CARE_PROVIDER_SITE_OTHER): Payer: Self-pay | Admitting: Family Medicine

## 2020-11-25 NOTE — Telephone Encounter (Signed)
Prior authorization has been started for Vyvanse. Will notify patient and provider once response is received.

## 2020-11-29 ENCOUNTER — Encounter (INDEPENDENT_AMBULATORY_CARE_PROVIDER_SITE_OTHER): Payer: Self-pay

## 2020-12-04 ENCOUNTER — Other Ambulatory Visit (INDEPENDENT_AMBULATORY_CARE_PROVIDER_SITE_OTHER): Payer: Self-pay | Admitting: Family Medicine

## 2020-12-04 DIAGNOSIS — E038 Other specified hypothyroidism: Secondary | ICD-10-CM

## 2020-12-06 NOTE — Telephone Encounter (Signed)
Wallace

## 2020-12-22 ENCOUNTER — Other Ambulatory Visit: Payer: Self-pay

## 2020-12-22 ENCOUNTER — Ambulatory Visit (INDEPENDENT_AMBULATORY_CARE_PROVIDER_SITE_OTHER): Payer: BC Managed Care – PPO | Admitting: Family Medicine

## 2020-12-22 ENCOUNTER — Other Ambulatory Visit (INDEPENDENT_AMBULATORY_CARE_PROVIDER_SITE_OTHER): Payer: Self-pay | Admitting: Family Medicine

## 2020-12-22 ENCOUNTER — Encounter (INDEPENDENT_AMBULATORY_CARE_PROVIDER_SITE_OTHER): Payer: Self-pay | Admitting: Family Medicine

## 2020-12-22 VITALS — BP 131/83 | HR 91 | Temp 98.2°F | Ht 63.0 in | Wt 234.0 lb

## 2020-12-22 DIAGNOSIS — E038 Other specified hypothyroidism: Secondary | ICD-10-CM

## 2020-12-22 DIAGNOSIS — E039 Hypothyroidism, unspecified: Secondary | ICD-10-CM

## 2020-12-22 DIAGNOSIS — R7301 Impaired fasting glucose: Secondary | ICD-10-CM | POA: Diagnosis not present

## 2020-12-22 DIAGNOSIS — E66813 Obesity, class 3: Secondary | ICD-10-CM

## 2020-12-22 DIAGNOSIS — I10 Essential (primary) hypertension: Secondary | ICD-10-CM | POA: Diagnosis not present

## 2020-12-22 DIAGNOSIS — F50819 Binge eating disorder, unspecified: Secondary | ICD-10-CM

## 2020-12-22 DIAGNOSIS — F5081 Binge eating disorder: Secondary | ICD-10-CM | POA: Diagnosis not present

## 2020-12-22 DIAGNOSIS — Z6841 Body Mass Index (BMI) 40.0 and over, adult: Secondary | ICD-10-CM

## 2020-12-22 MED ORDER — VYVANSE 60 MG PO CHEW
30.0000 mg | CHEWABLE_TABLET | Freq: Two times a day (BID) | ORAL | 0 refills | Status: DC
Start: 2020-12-22 — End: 2021-01-11

## 2020-12-22 MED ORDER — OZEMPIC (2 MG/DOSE) 8 MG/3ML ~~LOC~~ SOPN: 2.0000 mg | PEN_INJECTOR | SUBCUTANEOUS | 0 refills | Status: DC

## 2020-12-22 MED ORDER — ONDANSETRON 8 MG PO TBDP: 8.0000 mg | ORAL_TABLET | Freq: Two times a day (BID) | ORAL | 0 refills | Status: DC | PRN

## 2020-12-22 MED ORDER — LEVOTHYROXINE SODIUM 88 MCG PO TABS: 88.0000 ug | ORAL_TABLET | Freq: Every day | ORAL | 0 refills | Status: DC

## 2020-12-22 MED ORDER — HYDROCHLOROTHIAZIDE 25 MG PO TABS: 25.0000 mg | ORAL_TABLET | Freq: Every day | ORAL | 3 refills | Status: AC

## 2020-12-22 MED ORDER — LEVOTHYROXINE SODIUM 100 MCG PO TABS: ORAL_TABLET | ORAL | 0 refills | Status: DC

## 2020-12-22 NOTE — Telephone Encounter (Signed)
Wallace

## 2020-12-23 NOTE — Progress Notes (Signed)
Chief Complaint:   OBESITY Diana Avery is here to discuss her progress with her obesity treatment plan along with follow-up of her obesity related diagnoses. See Medical Weight Management Flowsheet for complete bioelectrical impedance results.  Today's visit was #: 7 Starting weight: 250 lbs Starting date: 06/08/2020 Weight change since last visit: 6 lbs Total lbs lost to date: 16 lbs Total weight loss percentage to date: -6.40%  Nutrition Plan: Category 3 Plan for 65-70% of the time.  Activity: Walking, dancing for 30, 15-20 minutes 3 times per week.  Anti-obesity medications: Ozempic 1 mg subcutaneously weekly. Reported side effects: None.  Interim History: [name] says she is working on decreasing her diet soda intake in order to drink more water.  Assessment/Plan:   1. Impaired fasting glucose, with polyphagia Improving, but not optimized. Current treatment: Ozempic 1 mg subcutaneously weekly.    Plan:  Increase Ozempic to 2 mg subcutaneously weekly, as per below. She will continue to focus on protein-rich, low simple carbohydrate foods. We reviewed the importance of hydration, regular exercise for stress reduction, and restorative sleep.  - Increase Semaglutide, 2 MG/DOSE, (OZEMPIC, 2 MG/DOSE,) 8 MG/3ML SOPN; Inject 2 mg into the skin once a week.  Dispense: 3 mL; Refill: 0  2. Acquired hypothyroidism Course: Controlled. Medication: levothyroxine 188 mcg daily.   Plan: Patient was instructed not to take MVM or iron within 4 hours of taking thyroid medications.  We will continue to monitor alongside PCP as it relates to her weight loss journey.   Lab Results  Component Value Date   TSH 0.517 10/18/2020   - Refill levothyroxine (SYNTHROID) 88 MCG tablet; Take 1 tablet (88 mcg total) by mouth daily before breakfast. TAKE WITH 100 MCG TO EQUAL 188 MCG DAILY.  Dispense: 90 tablet; Refill: 0 - Refill levothyroxine (SYNTHROID) 100 MCG tablet; TAKE 1 TABLET BY MOUTH EVERY DAY  BEFORE BREAKFAST  Dispense: 90 tablet; Refill: 0  3. Essential hypertension At goal. Medications: HCTZ 25 mg daily.   Plan: Avoid buying foods that are: processed, frozen, or prepackaged to avoid excess salt. We will watch for signs of hypotension as she continues lifestyle modifications.  BP Readings from Last 3 Encounters:  12/22/20 131/83  11/17/20 110/73  10/18/20 111/71   Lab Results  Component Value Date   CREATININE 0.62 10/18/2020   - Refill hydrochlorothiazide (HYDRODIURIL) 25 MG tablet; Take 1 tablet (25 mg total) by mouth daily.  Dispense: 90 tablet; Refill: 3  4. Binge eating disorder Raianna is taking Vyvanse 30 mg in the morning and 30 mg in the afternoon.     Plan:  The current medical regimen is effective;  continue present plan and medications.  5. Obesity, current BMI 41.6  Course: [name] is currently in the action stage of change. As such, her goal is to continue with weight loss efforts.   Nutrition goals: She has agreed to the Category 3 Plan.   Exercise goals:  As is.  Behavioral modification strategies: increasing lean protein intake, decreasing simple carbohydrates, increasing vegetables, and increasing water intake.  [name] has agreed to follow-up with our clinic in 4 weeks. She was informed of the importance of frequent follow-up visits to maximize her success with intensive lifestyle modifications for her multiple health conditions.   Objective:   Blood pressure 131/83, pulse 91, temperature 98.2 F (36.8 C), height 5\' 3"  (1.6 m), weight 234 lb (106.1 kg), SpO2 97 %, unknown if currently breastfeeding. Body mass index is 41.45 kg/m.  General: Cooperative, alert, well developed, in no acute distress. HEENT: Conjunctivae and lids unremarkable. Cardiovascular: Regular rhythm.  Lungs: Normal work of breathing. Neurologic: No focal deficits.   Lab Results  Component Value Date   CREATININE 0.62 10/18/2020   BUN 9 10/18/2020   NA 141 10/18/2020    K 3.9 10/18/2020   CL 102 10/18/2020   CO2 22 10/18/2020   Lab Results  Component Value Date   ALT 19 10/18/2020   AST 19 10/18/2020   ALKPHOS 81 10/18/2020   BILITOT 0.4 10/18/2020   Lab Results  Component Value Date   HGBA1C 5.7 (H) 10/18/2020   HGBA1C 5.7 (H) 06/08/2020   HGBA1C 5.5 11/30/2014   Lab Results  Component Value Date   INSULIN 15.8 10/18/2020   INSULIN 20.0 06/08/2020   Lab Results  Component Value Date   TSH 0.517 10/18/2020   Lab Results  Component Value Date   CHOL 167 10/18/2020   HDL 41 10/18/2020   LDLCALC 113 (H) 10/18/2020   TRIG 68 10/18/2020   CHOLHDL 4.1 10/18/2020   Lab Results  Component Value Date   VD25OH 43.0 10/18/2020   VD25OH 25.2 (L) 06/08/2020   Lab Results  Component Value Date   WBC 6.4 10/18/2020   HGB 12.5 10/18/2020   HCT 38.9 10/18/2020   MCV 80 10/18/2020   PLT 379 10/18/2020   Lab Results  Component Value Date   IRON 41 10/18/2020   TIBC 402 10/18/2020   FERRITIN 26 10/18/2020   Attestation Statements:   Reviewed by clinician on day of visit: allergies, medications, problem list, medical history, surgical history, family history, social history, and previous encounter notes.  I, Water quality scientist, CMA, am acting as transcriptionist for Briscoe Deutscher, DO  I have reviewed the above documentation for accuracy and completeness, and I agree with the above. -  Briscoe Deutscher, DO, MS, FAAFP, DABOM - Family and Bariatric Medicine.

## 2021-01-11 ENCOUNTER — Ambulatory Visit (INDEPENDENT_AMBULATORY_CARE_PROVIDER_SITE_OTHER): Payer: BC Managed Care – PPO | Admitting: Family Medicine

## 2021-01-11 ENCOUNTER — Encounter (INDEPENDENT_AMBULATORY_CARE_PROVIDER_SITE_OTHER): Payer: Self-pay | Admitting: Family Medicine

## 2021-01-11 ENCOUNTER — Other Ambulatory Visit: Payer: Self-pay

## 2021-01-11 VITALS — BP 110/74 | HR 72 | Temp 97.9°F | Ht 63.0 in | Wt 234.0 lb

## 2021-01-11 DIAGNOSIS — K5909 Other constipation: Secondary | ICD-10-CM | POA: Diagnosis not present

## 2021-01-11 DIAGNOSIS — F5081 Binge eating disorder: Secondary | ICD-10-CM

## 2021-01-11 DIAGNOSIS — E039 Hypothyroidism, unspecified: Secondary | ICD-10-CM

## 2021-01-11 DIAGNOSIS — Z6841 Body Mass Index (BMI) 40.0 and over, adult: Secondary | ICD-10-CM

## 2021-01-11 DIAGNOSIS — R7301 Impaired fasting glucose: Secondary | ICD-10-CM | POA: Diagnosis not present

## 2021-01-11 DIAGNOSIS — E559 Vitamin D deficiency, unspecified: Secondary | ICD-10-CM | POA: Diagnosis not present

## 2021-01-11 DIAGNOSIS — R790 Abnormal level of blood mineral: Secondary | ICD-10-CM

## 2021-01-11 MED ORDER — ONDANSETRON 8 MG PO TBDP: 8.0000 mg | ORAL_TABLET | Freq: Two times a day (BID) | ORAL | 0 refills | Status: DC | PRN

## 2021-01-11 MED ORDER — LINACLOTIDE 72 MCG PO CAPS: 72.0000 ug | ORAL_CAPSULE | Freq: Two times a day (BID) | ORAL | 0 refills | Status: AC | PRN

## 2021-01-11 MED ORDER — OZEMPIC (2 MG/DOSE) 8 MG/3ML ~~LOC~~ SOPN
2.0000 mg | PEN_INJECTOR | SUBCUTANEOUS | 0 refills | Status: DC
Start: 2021-01-11 — End: 2021-02-01

## 2021-01-11 MED ORDER — VITAMIN D (ERGOCALCIFEROL) 1.25 MG (50000 UNIT) PO CAPS: 50000.0000 [IU] | ORAL_CAPSULE | ORAL | 0 refills | Status: DC

## 2021-01-11 MED ORDER — VYVANSE 60 MG PO CHEW
30.0000 mg | CHEWABLE_TABLET | Freq: Two times a day (BID) | ORAL | 0 refills | Status: DC
Start: 2021-01-11 — End: 2021-02-01

## 2021-01-12 LAB — IRON,TIBC AND FERRITIN PANEL
Ferritin: 26 ng/mL (ref 15–150)
Iron Saturation: 17 % (ref 15–55)
Iron: 67 ug/dL (ref 27–159)
Total Iron Binding Capacity: 404 ug/dL (ref 250–450)
UIBC: 337 ug/dL (ref 131–425)

## 2021-01-12 LAB — TSH: TSH: 0.192 u[IU]/mL — ABNORMAL LOW (ref 0.450–4.500)

## 2021-01-12 LAB — T4, FREE: Free T4: 1.85 ng/dL — ABNORMAL HIGH (ref 0.82–1.77)

## 2021-01-12 NOTE — Progress Notes (Signed)
Chief Complaint:   OBESITY Diana Avery is here to discuss her progress with her obesity treatment plan along with follow-up of her obesity related diagnoses. See Medical Weight Management Flowsheet for complete bioelectrical impedance results.  Today's visit was #: 8 Starting weight: 250 lbs Starting date: 06/08/2020 Weight change since last visit: 0 Total lbs lost to date: 16 lbs Total weight loss percentage to date: -6.40%  Nutrition Plan: Category 3 Plan for 50% of the time.  Activity: Walking/dance for 15-20 minutes 2-3 times per week.  Anti-obesity medications: Ozempic 1.5 mg subcutaneously weekly. Reported side effects: None.  Interim History: [name] is working less hours at her second job, so she is now working around 50 hours per week.  Her husband is helping her get to bed earlier (goal is 10 pm).  She endorses constipation with a BM once a week, painful.  She is continuing to work on [employer] sodas.  Assessment/Plan:   1. Other constipation This problem is uncontrolled with OTC treatment. Risk versus benefits of medication reviewed. The patient understands monitoring parameters and red flags.  Plan:  Start Linzess 72 mcg 1-2 capsules daily as needed for constipation.  - Start linaclotide (LINZESS) 72 MCG capsule; Take 1-2 capsules (72-144 mcg total) by mouth 2 (two) times daily as needed.  Dispense: 30 capsule; Refill: 0  2. Acquired hypothyroidism Course: Controlled. Medication: levothyroxine 188 mcg daily.   Plan: Patient was instructed not to take MVM or iron within 4 hours of taking thyroid medications. We will continue to monitor alongside Endocrinology/PCP as it relates to her weight loss journey.  Check TSH and free T4 today.   Lab Results  Component Value Date   TSH 0.517 10/18/2020   - TSH - T4, free  3. Impaired fasting glucose, with polyphagia Controlled. Current treatment: Ozempic 1.5 mg subcutaneously weekly.    Plan:  Continue Ozempic 1.5 mg  subcutaneously weekly, as per below. She will continue to focus on protein-rich, low simple carbohydrate foods. We reviewed the importance of hydration, regular exercise for stress reduction, and restorative sleep.  - Continue Semaglutide, 2 MG/DOSE, (OZEMPIC, 2 MG/DOSE,) 8 MG/3ML SOPN; Inject 2 mg into the skin once a week.  Dispense: 3 mL; Refill: 0  4. Vitamin D deficiency Improving, but not optimized. She is taking vitamin D 50,000 IU weekly.  Plan: Continue to take prescription Vitamin D @50 ,000 IU every week as prescribed.  Follow-up for routine testing of Vitamin D, at least 2-3 times per year to avoid over-replacement.  Lab Results  Component Value Date   VD25OH 43.0 10/18/2020   VD25OH 25.2 (L) 06/08/2020   - Refill Vitamin D, Ergocalciferol, (DRISDOL) 1.25 MG (50000 UNIT) CAPS capsule; Take 1 capsule (50,000 Units total) by mouth every 7 (seven) days.  Dispense: 4 capsule; Refill: 0  5. Low ferritin Nutrition: Iron-rich foods include dark leafy greens, red and white meats, eggs, seafood, and beans.  Certain foods and drinks prevent your body from absorbing iron properly. Avoid eating these foods in the same meal as iron-rich foods or with iron supplements. These foods include: coffee, black tea, and red wine; milk, dairy products, and foods that are high in calcium; beans and soybeans; whole grains. Constipation can be a side effect of iron supplementation. Increased water and fiber intake are helpful. Water goal: > 2 liters/day. Fiber goal: > 25 grams/day.  Plan:  Will check labs today, as per below.  - Iron, TIBC and Ferritin Panel  6. Binge eating  disorder Emelda is taking Vyvanse 30 mg in the morning and 30 mg in the afternoon.     Plan:  The current medical regimen is effective;  continue present plan and medications.  The goals for treatment of BED are to reduce eating binges and to achieve healthy eating habits. Because binge eating can correlate with negative emotions,  treatment may also address any other mental-health issues, such as depression.  People who binge eat feel as if they don't have control over how much they eat and have feelings of guilt or self-loathing after a binge eating episode.  The FDA has approved Vyvanse as a treatment option for binge eating disorder. Vyvanse targets the brain's reward center by increasing the levels of dopamine and norepinephrine, the chemicals of the brain responsible for feelings of pleasure.  Mindful eating is the recommended nutritional approach to treating BED.   - Refill Lisdexamfetamine Dimesylate (VYVANSE) 60 MG CHEW; Chew 30 mg by mouth 2 (two) times daily with breakfast and lunch.  Dispense: 30 tablet; Refill: 0 - Refill ondansetron (ZOFRAN ODT) 8 MG disintegrating tablet; Take 1 tablet (8 mg total) by mouth every 12 (twelve) hours as needed for nausea or vomiting.  Dispense: 20 tablet; Refill: 0  I have consulted the [place] Controlled Substances Registry for this patient, and feel the risk/benefit ratio today is favorable for proceeding with this prescription for a controlled substance. The patient understands monitoring parameters and red flags.   7. Obesity, current BMI 41.6  Course: Kearia is currently in the action stage of change. As such, her goal is to continue with weight loss efforts.   Nutrition goals: She has agreed to the Category 3 Plan.   Exercise goals:  As is.  Behavioral modification strategies: increasing lean protein intake, decreasing simple carbohydrates, increasing vegetables, and increasing water intake.  Neli has agreed to follow-up with our clinic in 4-6 weeks. She was informed of the importance of frequent follow-up visits to maximize her success with intensive lifestyle modifications for her multiple health conditions.   Noretta was informed we would discuss her lab results at her next visit unless there is a critical issue that needs to be addressed sooner. Zaia agreed to keep  her next visit at the agreed upon time to discuss these results.  Objective:   Blood pressure 110/74, pulse 72, temperature 97.9 F (36.6 C), temperature source Oral, height 5\' 3"  (1.6 m), weight 234 lb (106.1 kg), SpO2 98 %, unknown if currently breastfeeding. Body mass index is 41.45 kg/m.  General: Cooperative, alert, well developed, in no acute distress. HEENT: Conjunctivae and lids unremarkable. Cardiovascular: Regular rhythm.  Lungs: Normal work of breathing. Neurologic: No focal deficits.   Lab Results  Component Value Date   CREATININE 0.62 10/18/2020   BUN 9 10/18/2020   NA 141 10/18/2020   K 3.9 10/18/2020   CL 102 10/18/2020   CO2 22 10/18/2020   Lab Results  Component Value Date   ALT 19 10/18/2020   AST 19 10/18/2020   ALKPHOS 81 10/18/2020   BILITOT 0.4 10/18/2020   Lab Results  Component Value Date   HGBA1C 5.7 (H) 10/18/2020   HGBA1C 5.7 (H) 06/08/2020   HGBA1C 5.5 11/30/2014   Lab Results  Component Value Date   INSULIN 15.8 10/18/2020   INSULIN 20.0 06/08/2020   Lab Results  Component Value Date   TSH 0.517 10/18/2020   Lab Results  Component Value Date   CHOL 167 10/18/2020   HDL 41  10/18/2020   LDLCALC 113 (H) 10/18/2020   TRIG 68 10/18/2020   CHOLHDL 4.1 10/18/2020   Lab Results  Component Value Date   VD25OH 43.0 10/18/2020   VD25OH 25.2 (L) 06/08/2020   Lab Results  Component Value Date   WBC 6.4 10/18/2020   HGB 12.5 10/18/2020   HCT 38.9 10/18/2020   MCV 80 10/18/2020   PLT 379 10/18/2020   Lab Results  Component Value Date   IRON 41 10/18/2020   TIBC 402 10/18/2020   FERRITIN 26 10/18/2020   Attestation Statements:   Reviewed by clinician on day of visit: allergies, medications, problem list, medical history, surgical history, family history, social history, and previous encounter notes.  I, Water quality scientist, CMA, am acting as transcriptionist for Briscoe Deutscher, DO  I have reviewed the above documentation for accuracy  and completeness, and I agree with the above. -  Briscoe Deutscher, DO, MS, FAAFP, DABOM - Family and Bariatric Medicine.

## 2021-02-01 ENCOUNTER — Other Ambulatory Visit: Payer: Self-pay

## 2021-02-01 ENCOUNTER — Encounter (INDEPENDENT_AMBULATORY_CARE_PROVIDER_SITE_OTHER): Payer: Self-pay | Admitting: Family Medicine

## 2021-02-01 ENCOUNTER — Ambulatory Visit (INDEPENDENT_AMBULATORY_CARE_PROVIDER_SITE_OTHER): Payer: BC Managed Care – PPO | Admitting: Family Medicine

## 2021-02-01 VITALS — BP 109/73 | HR 78 | Temp 98.1°F | Ht 63.0 in | Wt 231.0 lb

## 2021-02-01 DIAGNOSIS — R7301 Impaired fasting glucose: Secondary | ICD-10-CM

## 2021-02-01 DIAGNOSIS — E039 Hypothyroidism, unspecified: Secondary | ICD-10-CM | POA: Diagnosis not present

## 2021-02-01 DIAGNOSIS — L298 Other pruritus: Secondary | ICD-10-CM | POA: Diagnosis not present

## 2021-02-01 DIAGNOSIS — Z6841 Body Mass Index (BMI) 40.0 and over, adult: Secondary | ICD-10-CM

## 2021-02-01 DIAGNOSIS — E559 Vitamin D deficiency, unspecified: Secondary | ICD-10-CM | POA: Diagnosis not present

## 2021-02-01 DIAGNOSIS — F5081 Binge eating disorder: Secondary | ICD-10-CM

## 2021-02-01 MED ORDER — VITAMIN D (ERGOCALCIFEROL) 1.25 MG (50000 UNIT) PO CAPS: 50000.0000 [IU] | ORAL_CAPSULE | ORAL | 0 refills | Status: DC

## 2021-02-01 MED ORDER — VYVANSE 60 MG PO CHEW: 30.0000 mg | CHEWABLE_TABLET | Freq: Two times a day (BID) | ORAL | 0 refills | Status: DC

## 2021-02-01 MED ORDER — OZEMPIC (2 MG/DOSE) 8 MG/3ML ~~LOC~~ SOPN: 2.0000 mg | PEN_INJECTOR | SUBCUTANEOUS | 0 refills | Status: DC

## 2021-02-01 MED ORDER — LEVOTHYROXINE SODIUM 175 MCG PO TABS: 175.0000 ug | ORAL_TABLET | Freq: Every day | ORAL | 3 refills | Status: DC

## 2021-02-01 MED ORDER — ONDANSETRON 8 MG PO TBDP: 8.0000 mg | ORAL_TABLET | Freq: Two times a day (BID) | ORAL | 0 refills | Status: DC | PRN

## 2021-02-03 MED ORDER — CLOBETASOL PROPIONATE 0.05 % EX FOAM: Freq: Two times a day (BID) | CUTANEOUS | 0 refills | Status: AC

## 2021-02-03 NOTE — Progress Notes (Signed)
Chief Complaint:   OBESITY Diana Avery is here to discuss her progress with her obesity treatment plan along with follow-up of her obesity related diagnoses. See Medical Weight Management Flowsheet for complete bioelectrical impedance results.  Today's visit was #: 9 Starting weight: 250 lbs Starting date: 06/08/2020 Weight change since last visit: 3 lbs Total lbs lost to date: 19 lbs Total weight loss percentage to date: -7.60%  Nutrition Plan: Category 3 Plan for 65% of the time.  Activity: Dance/walking for 15-20 minutes 3-4 times per week.  Anti-obesity medications: Ozempic 2 mg subcutaneously weekly. Reported side effects: None.  Interim History: Diana Avery has added strength training to her exercise regimen.  She is down 5 pounds of fat and up 2 pounds of muscle.  Assessment/Plan:   1. Pruritic dermatosis of scalp Start clobetasol foam twice daily for 14 days.  - Start clobetasol (OLUX) 0.05 % topical foam; Apply topically 2 (two) times daily for 14 days.  Dispense: 50 g; Refill: 0  2. Impaired fasting glucose, with polyphagia Controlled. Current treatment: Ozempic 2 mg subcutaneously weekly.  She endorses nausea.  Using Zofran.  Plan:  Continue Ozempic 2 mg subcutaneously weekly.  Will refill today. She will continue to focus on protein-rich, low simple carbohydrate foods. We reviewed the importance of hydration, regular exercise for stress reduction, and restorative sleep.  - Refill Semaglutide, 2 MG/DOSE, (OZEMPIC, 2 MG/DOSE,) 8 MG/3ML SOPN; Inject 2 mg into the skin once a week.  Dispense: 3 mL; Refill: 0  3. Vitamin D deficiency Not at goal. She is taking vitamin D 50,000 IU weekly.  Plan: Continue to take prescription Vitamin D @50 ,000 IU every week as prescribed.  Follow-up for routine testing of Vitamin D, at least 2-3 times per year to avoid over-replacement.  Lab Results  Component Value Date   VD25OH 43.0 10/18/2020   VD25OH 25.2 (L) 06/08/2020   - Refill  Vitamin D, Ergocalciferol, (DRISDOL) 1.25 MG (50000 UNIT) CAPS capsule; Take 1 capsule (50,000 Units total) by mouth every 7 (seven) days.  Dispense: 4 capsule; Refill: 0  4. Acquired hypothyroidism Medication: levothyroxine 100 mcg daily.   Plan:  Increase levothyroxine to 175 mcg daily. Patient was instructed not to take MVM or iron within 4 hours of taking thyroid medications.  We will continue to monitor alongside Endocrinology/PCP as it relates to her weight loss journey.   Lab Results  Component Value Date   TSH 0.192 (L) 01/11/2021   - Increase levothyroxine (SYNTHROID) 175 MCG tablet; Take 1 tablet (175 mcg total) by mouth daily.  Dispense: 90 tablet; Refill: 3  5. Binge eating disorder Diana Avery is taking Vyvanse 30 mg in the morning and 30 mg in the afternoon.     Plan:  The current medical regimen is effective; continue present plan and medications.  - Refill ondansetron (ZOFRAN ODT) 8 MG disintegrating tablet; Take 1 tablet (8 mg total) by mouth every 12 (twelve) hours as needed for nausea or vomiting.  Dispense: 20 tablet; Refill: 0 - Refill Lisdexamfetamine Dimesylate (VYVANSE) 60 MG CHEW; Chew 30 mg by mouth 2 (two) times daily with breakfast and lunch.  Dispense: 30 tablet; Refill: 0  I have consulted the [place] Controlled Substances Registry for this patient, and feel the risk/benefit ratio today is favorable for proceeding with this prescription for a controlled substance. The patient understands monitoring parameters and red flags.   6. Obesity, current BMI 40.9  Course: Diana Avery is currently in the action stage of change. As  such, her goal is to continue with weight loss efforts.   Nutrition goals: She has agreed to the Category 3 Plan.   Exercise goals:  As is.  Behavioral modification strategies: increasing lean protein intake, decreasing simple carbohydrates, increasing vegetables, increasing water intake, and decreasing liquid calories.  Diana Avery has agreed to follow-up  with our clinic in 4 weeks. She was informed of the importance of frequent follow-up visits to maximize her success with intensive lifestyle modifications for her multiple health conditions.   Objective:   Blood pressure 109/73, pulse 78, temperature 98.1 F (36.7 C), temperature source Oral, height 5\' 3"  (1.6 m), weight 231 lb (104.8 kg), SpO2 97 %, unknown if currently breastfeeding. Body mass index is 40.92 kg/m.  General: Cooperative, alert, well developed, in no acute distress. HEENT: Conjunctivae and lids unremarkable. Cardiovascular: Regular rhythm.  Lungs: Normal work of breathing. Neurologic: No focal deficits.   Lab Results  Component Value Date   CREATININE 0.62 10/18/2020   BUN 9 10/18/2020   NA 141 10/18/2020   K 3.9 10/18/2020   CL 102 10/18/2020   CO2 22 10/18/2020   Lab Results  Component Value Date   ALT 19 10/18/2020   AST 19 10/18/2020   ALKPHOS 81 10/18/2020   BILITOT 0.4 10/18/2020   Lab Results  Component Value Date   HGBA1C 5.7 (H) 10/18/2020   HGBA1C 5.7 (H) 06/08/2020   HGBA1C 5.5 11/30/2014   Lab Results  Component Value Date   INSULIN 15.8 10/18/2020   INSULIN 20.0 06/08/2020   Lab Results  Component Value Date   TSH 0.192 (L) 01/11/2021   Lab Results  Component Value Date   CHOL 167 10/18/2020   HDL 41 10/18/2020   LDLCALC 113 (H) 10/18/2020   TRIG 68 10/18/2020   CHOLHDL 4.1 10/18/2020   Lab Results  Component Value Date   VD25OH 43.0 10/18/2020   VD25OH 25.2 (L) 06/08/2020   Lab Results  Component Value Date   WBC 6.4 10/18/2020   HGB 12.5 10/18/2020   HCT 38.9 10/18/2020   MCV 80 10/18/2020   PLT 379 10/18/2020   Lab Results  Component Value Date   IRON 67 01/11/2021   TIBC 404 01/11/2021   FERRITIN 26 01/11/2021   Attestation Statements:   Reviewed by clinician on day of visit: allergies, medications, problem list, medical history, surgical history, family history, social history, and previous encounter  notes.  I, Water quality scientist, CMA, am acting as transcriptionist for Briscoe Deutscher, DO  I have reviewed the above documentation for accuracy and completeness, and I agree with the above. -  Briscoe Deutscher, DO, MS, FAAFP, DABOM - Family and Bariatric Medicine.

## 2021-03-01 ENCOUNTER — Ambulatory Visit (INDEPENDENT_AMBULATORY_CARE_PROVIDER_SITE_OTHER): Payer: BC Managed Care – PPO | Admitting: Family Medicine

## 2021-03-05 ENCOUNTER — Other Ambulatory Visit (INDEPENDENT_AMBULATORY_CARE_PROVIDER_SITE_OTHER): Payer: Self-pay | Admitting: Family Medicine

## 2021-03-05 DIAGNOSIS — E559 Vitamin D deficiency, unspecified: Secondary | ICD-10-CM

## 2021-03-05 DIAGNOSIS — R7301 Impaired fasting glucose: Secondary | ICD-10-CM

## 2021-03-05 DIAGNOSIS — F5081 Binge eating disorder: Secondary | ICD-10-CM

## 2021-03-07 MED ORDER — VYVANSE 60 MG PO CHEW: 30.0000 mg | CHEWABLE_TABLET | Freq: Two times a day (BID) | ORAL | 0 refills | Status: DC

## 2021-03-07 MED ORDER — OZEMPIC (2 MG/DOSE) 8 MG/3ML ~~LOC~~ SOPN: 2.0000 mg | PEN_INJECTOR | SUBCUTANEOUS | 0 refills | Status: DC

## 2021-03-07 NOTE — Telephone Encounter (Signed)
Made in error

## 2021-03-07 NOTE — Telephone Encounter (Signed)
LOV w/ Wallace

## 2021-03-15 ENCOUNTER — Encounter (INDEPENDENT_AMBULATORY_CARE_PROVIDER_SITE_OTHER): Payer: Self-pay | Admitting: Family Medicine

## 2021-03-15 ENCOUNTER — Other Ambulatory Visit: Payer: Self-pay

## 2021-03-15 ENCOUNTER — Ambulatory Visit (INDEPENDENT_AMBULATORY_CARE_PROVIDER_SITE_OTHER): Payer: BC Managed Care – PPO | Admitting: Family Medicine

## 2021-03-15 VITALS — BP 113/78 | HR 80 | Temp 98.0°F | Ht 63.0 in | Wt 226.0 lb

## 2021-03-15 DIAGNOSIS — E039 Hypothyroidism, unspecified: Secondary | ICD-10-CM

## 2021-03-15 DIAGNOSIS — E669 Obesity, unspecified: Secondary | ICD-10-CM | POA: Diagnosis not present

## 2021-03-15 DIAGNOSIS — F5081 Binge eating disorder: Secondary | ICD-10-CM | POA: Diagnosis not present

## 2021-03-15 DIAGNOSIS — R7301 Impaired fasting glucose: Secondary | ICD-10-CM

## 2021-03-15 DIAGNOSIS — Z6841 Body Mass Index (BMI) 40.0 and over, adult: Secondary | ICD-10-CM

## 2021-03-15 MED ORDER — VYVANSE 60 MG PO CHEW: 60.0000 mg | CHEWABLE_TABLET | Freq: Every day | ORAL | 0 refills | Status: DC

## 2021-03-17 NOTE — Progress Notes (Signed)
Chief Complaint:   OBESITY Diana Avery is here to discuss her progress with her obesity treatment plan along with follow-up of her obesity related diagnoses. See Medical Weight Management Flowsheet for complete bioelectrical impedance results.  Today's visit was #: 10 Starting weight: 250 lbs Starting date: 06/08/2020 Weight change since last visit: 5 lbs Total lbs lost to date: 19 lbs Total weight loss percentage to date: -9.60%  Nutrition Plan: Category 3 Plan for 65% of the time.  Activity: Walking/cardio for 15-25 minutes 3 times per week.  Anti-obesity medications: Ozempic 2 mg subcutaneously weekly. Reported side effects: None.  Interim History: Diana Avery says that Vyvanse is decreasing the urge to binge.  She reports that Ozempic has decreased her polyphagia.  Denies side effects.  Assessment/Plan:   1. Impaired fasting glucose, with polyphagia Improving. Current treatment: Ozempic 2 mg subcutaneously weekly.    Plan: Continue Ozempic 2 mg subcutaneously weekly.  She will continue to focus on protein-rich, low simple carbohydrate foods. We reviewed the importance of hydration, regular exercise for stress reduction, and restorative sleep.  2. Acquired hypothyroidism Medication: levothyroxine 175 mcg daily.   Plan: Patient was instructed not to take MVM or iron within 4 hours of taking thyroid medications.  We will continue to monitor alongside Endocrinology/PCP as it relates to her weight loss journey.   Lab Results  Component Value Date   TSH 0.192 (L) 01/11/2021   3. Binge eating disorder Diana Avery is taking Vyvanse 60 mg daily.      Plan:  The current medical regimen is effective; continue present plan and medications.  Will refill Vyvanse 60 mg today, as per below.  The goals for treatment of BED are to reduce eating binges and to achieve healthy eating habits. Because binge eating can correlate with negative emotions, treatment may also address any other mental-health  issues, such as depression.  People who binge eat feel as if they don't have control over how much they eat and have feelings of guilt or self-loathing after a binge eating episode.  The FDA has approved Vyvanse as a treatment option for binge eating disorder. Vyvanse targets the brain's reward center by increasing the levels of dopamine and norepinephrine, the chemicals of the brain responsible for feelings of pleasure.  Mindful eating is the recommended nutritional approach to treating BED.   - Refill Lisdexamfetamine Dimesylate (VYVANSE) 60 MG CHEW; Chew 60 mg by mouth daily.  Dispense: 30 tablet; Refill: 0  I have consulted the [place] Controlled Substances Registry for this patient, and feel the risk/benefit ratio today is favorable for proceeding with this prescription for a controlled substance. The patient understands monitoring parameters and red flags.   4. Obesity, current BMI 40.2  Course: Diana Avery is currently in the action stage of change. As such, her goal is to continue with weight loss efforts.   Nutrition goals: She has agreed to the Category 3 Plan.   Exercise goals:  As is.  Behavioral modification strategies: increasing lean protein intake, decreasing simple carbohydrates, and increasing vegetables.  Diana Avery has agreed to follow-up with our clinic in 4 weeks. She was informed of the importance of frequent follow-up visits to maximize her success with intensive lifestyle modifications for her multiple health conditions.   Objective:   Blood pressure 113/78, pulse 80, temperature 98 F (36.7 C), temperature source Oral, height 5\' 3"  (1.6 m), weight 226 lb (102.5 kg), SpO2 97 %, unknown if currently breastfeeding. Body mass index is 40.03 kg/m.  General: Cooperative,  alert, well developed, in no acute distress. HEENT: Conjunctivae and lids unremarkable. Cardiovascular: Regular rhythm.  Lungs: Normal work of breathing. Neurologic: No focal deficits.   Lab Results   Component Value Date   CREATININE 0.62 10/18/2020   BUN 9 10/18/2020   NA 141 10/18/2020   K 3.9 10/18/2020   CL 102 10/18/2020   CO2 22 10/18/2020   Lab Results  Component Value Date   ALT 19 10/18/2020   AST 19 10/18/2020   ALKPHOS 81 10/18/2020   BILITOT 0.4 10/18/2020   Lab Results  Component Value Date   HGBA1C 5.7 (H) 10/18/2020   HGBA1C 5.7 (H) 06/08/2020   HGBA1C 5.5 11/30/2014   Lab Results  Component Value Date   INSULIN 15.8 10/18/2020   INSULIN 20.0 06/08/2020   Lab Results  Component Value Date   TSH 0.192 (L) 01/11/2021   Lab Results  Component Value Date   CHOL 167 10/18/2020   HDL 41 10/18/2020   LDLCALC 113 (H) 10/18/2020   TRIG 68 10/18/2020   CHOLHDL 4.1 10/18/2020   Lab Results  Component Value Date   VD25OH 43.0 10/18/2020   VD25OH 25.2 (L) 06/08/2020   Lab Results  Component Value Date   WBC 6.4 10/18/2020   HGB 12.5 10/18/2020   HCT 38.9 10/18/2020   MCV 80 10/18/2020   PLT 379 10/18/2020   Lab Results  Component Value Date   IRON 67 01/11/2021   TIBC 404 01/11/2021   FERRITIN 26 01/11/2021   Attestation Statements:   Reviewed by clinician on day of visit: allergies, medications, problem list, medical history, surgical history, family history, social history, and previous encounter notes.  I, Water quality scientist, CMA, am acting as transcriptionist for Briscoe Deutscher, DO  I have reviewed the above documentation for accuracy and completeness, and I agree with the above. -  Briscoe Deutscher, DO, MS, FAAFP, DABOM - Family and Bariatric Medicine.

## 2021-04-10 ENCOUNTER — Other Ambulatory Visit (INDEPENDENT_AMBULATORY_CARE_PROVIDER_SITE_OTHER): Payer: Self-pay | Admitting: Family Medicine

## 2021-04-10 DIAGNOSIS — R7301 Impaired fasting glucose: Secondary | ICD-10-CM

## 2021-04-14 ENCOUNTER — Ambulatory Visit (INDEPENDENT_AMBULATORY_CARE_PROVIDER_SITE_OTHER): Payer: BC Managed Care – PPO | Admitting: Family Medicine

## 2021-04-15 ENCOUNTER — Other Ambulatory Visit (INDEPENDENT_AMBULATORY_CARE_PROVIDER_SITE_OTHER): Payer: Self-pay | Admitting: Family Medicine

## 2021-04-15 ENCOUNTER — Encounter (INDEPENDENT_AMBULATORY_CARE_PROVIDER_SITE_OTHER): Payer: Self-pay | Admitting: Family Medicine

## 2021-04-15 DIAGNOSIS — F5081 Binge eating disorder: Secondary | ICD-10-CM

## 2021-04-15 DIAGNOSIS — E559 Vitamin D deficiency, unspecified: Secondary | ICD-10-CM

## 2021-04-19 MED ORDER — VYVANSE 60 MG PO CHEW: 60.0000 mg | CHEWABLE_TABLET | Freq: Every day | ORAL | 0 refills | Status: AC

## 2021-04-19 MED ORDER — VITAMIN D (ERGOCALCIFEROL) 1.25 MG (50000 UNIT) PO CAPS: 50000.0000 [IU] | ORAL_CAPSULE | ORAL | 0 refills | Status: AC

## 2021-04-28 ENCOUNTER — Encounter (INDEPENDENT_AMBULATORY_CARE_PROVIDER_SITE_OTHER): Payer: Self-pay | Admitting: Family Medicine

## 2021-04-28 ENCOUNTER — Ambulatory Visit (INDEPENDENT_AMBULATORY_CARE_PROVIDER_SITE_OTHER): Payer: BC Managed Care – PPO | Admitting: Family Medicine

## 2021-04-28 VITALS — BP 105/81 | HR 76 | Temp 97.9°F | Ht 63.0 in | Wt 221.0 lb

## 2021-04-28 DIAGNOSIS — E039 Hypothyroidism, unspecified: Secondary | ICD-10-CM

## 2021-04-28 DIAGNOSIS — F5081 Binge eating disorder: Secondary | ICD-10-CM

## 2021-04-28 DIAGNOSIS — E669 Obesity, unspecified: Secondary | ICD-10-CM

## 2021-04-28 DIAGNOSIS — R790 Abnormal level of blood mineral: Secondary | ICD-10-CM | POA: Diagnosis not present

## 2021-04-28 DIAGNOSIS — Z6839 Body mass index (BMI) 39.0-39.9, adult: Secondary | ICD-10-CM

## 2021-04-28 DIAGNOSIS — E559 Vitamin D deficiency, unspecified: Secondary | ICD-10-CM | POA: Diagnosis not present

## 2021-04-28 DIAGNOSIS — R7301 Impaired fasting glucose: Secondary | ICD-10-CM | POA: Diagnosis not present

## 2021-04-28 MED ORDER — ONDANSETRON 8 MG PO TBDP: 8.0000 mg | ORAL_TABLET | Freq: Two times a day (BID) | ORAL | 0 refills | Status: AC | PRN

## 2021-04-28 MED ORDER — LEVOTHYROXINE SODIUM 175 MCG PO TABS: 175.0000 ug | ORAL_TABLET | Freq: Every day | ORAL | 3 refills | Status: AC

## 2021-04-28 MED ORDER — OZEMPIC (2 MG/DOSE) 8 MG/3ML ~~LOC~~ SOPN: 2.0000 mg | PEN_INJECTOR | SUBCUTANEOUS | 2 refills | Status: AC

## 2021-04-29 LAB — CBC WITH DIFFERENTIAL/PLATELET
Basophils Absolute: 0 10*3/uL (ref 0.0–0.2)
Basos: 0 %
EOS (ABSOLUTE): 0.1 10*3/uL (ref 0.0–0.4)
Eos: 2 %
Hemoglobin: 13.3 g/dL (ref 11.1–15.9)
Immature Grans (Abs): 0 10*3/uL (ref 0.0–0.1)
Immature Granulocytes: 0 %
Lymphocytes Absolute: 2.5 10*3/uL (ref 0.7–3.1)
Lymphs: 32 %
MCH: 27 pg (ref 26.6–33.0)
MCHC: 32.4 g/dL (ref 31.5–35.7)
MCV: 83 fL (ref 79–97)
Monocytes Absolute: 0.6 10*3/uL (ref 0.1–0.9)
Monocytes: 8 %
Neutrophils Absolute: 4.5 10*3/uL (ref 1.4–7.0)
Neutrophils: 58 %
Platelets: 426 10*3/uL (ref 150–450)
RBC: 4.92 x10E6/uL (ref 3.77–5.28)
RDW: 14.1 % (ref 11.7–15.4)
WBC: 7.8 10*3/uL (ref 3.4–10.8)

## 2021-04-29 LAB — COMPREHENSIVE METABOLIC PANEL
ALT: 14 IU/L (ref 0–32)
AST: 14 IU/L (ref 0–40)
Albumin/Globulin Ratio: 1.8 (ref 1.2–2.2)
Albumin: 4.6 g/dL (ref 3.8–4.8)
Alkaline Phosphatase: 79 IU/L (ref 44–121)
BUN/Creatinine Ratio: 15 (ref 9–23)
BUN: 11 mg/dL (ref 6–20)
Bilirubin Total: 0.4 mg/dL (ref 0.0–1.2)
CO2: 21 mmol/L (ref 20–29)
Calcium: 9.1 mg/dL (ref 8.7–10.2)
Chloride: 99 mmol/L (ref 96–106)
Creatinine, Ser: 0.71 mg/dL (ref 0.57–1.00)
Globulin, Total: 2.6 g/dL (ref 1.5–4.5)
Glucose: 83 mg/dL (ref 70–99)
Potassium: 3.8 mmol/L (ref 3.5–5.2)
Sodium: 138 mmol/L (ref 134–144)
Total Protein: 7.2 g/dL (ref 6.0–8.5)
eGFR: 117 mL/min/{1.73_m2} (ref >59)

## 2021-04-29 LAB — ANEMIA PANEL
Ferritin: 18 ng/mL (ref 15–150)
Folate, Hemolysate: 317 ng/mL
Folate, RBC: 773 ng/mL (ref >498)
Hematocrit: 41 % (ref 34.0–46.6)
Iron Saturation: 12 % — ABNORMAL LOW (ref 15–55)
Iron: 45 ug/dL (ref 27–159)
Retic Ct Pct: 1.3 % (ref 0.6–2.6)
Total Iron Binding Capacity: 375 ug/dL (ref 250–450)
UIBC: 330 ug/dL (ref 131–425)
Vitamin B-12: 340 pg/mL (ref 232–1245)

## 2021-04-29 LAB — VITAMIN D 25 HYDROXY (VIT D DEFICIENCY, FRACTURES): Vit D, 25-Hydroxy: 56.2 ng/mL (ref 30.0–100.0)

## 2021-04-29 LAB — TSH: TSH: 0.112 u[IU]/mL — ABNORMAL LOW (ref 0.450–4.500)

## 2021-04-29 LAB — HEMOGLOBIN A1C
Est. average glucose Bld gHb Est-mCnc: 103 mg/dL
Hgb A1c MFr Bld: 5.2 % (ref 4.8–5.6)

## 2021-04-29 LAB — T4, FREE: Free T4: 2.11 ng/dL — ABNORMAL HIGH (ref 0.82–1.77)

## 2021-05-03 NOTE — Progress Notes (Signed)
Chief Complaint:   OBESITY Diana Avery is here to discuss her progress with her obesity treatment plan along with follow-up of her obesity related diagnoses. See Medical Weight Management Flowsheet for complete bioelectrical impedance results.  Today's visit was #: 11 Starting weight: 250 lbs Starting date: 06/08/2020 Weight change since last visit: 5 lbs Total lbs lost to date: 29 lbs Total weight loss percentage to date: -11.60%  Nutrition Plan: Category 3 Plan for 65% of the time.  Activity: Increased activity/walking. Anti-obesity medications: Ozempic 2 mg subcutaneously weekly. Reported side effects: None.  Interim History: Diana Avery's highest weight was 299 lbs, and today she is 221 lbs.  She says she is feeling better.  She is able to exercise more.  Assessment/Plan:   1. Vitamin D deficiency Not at goal. She is taking vitamin D 50,000 IU weekly.  Plan: Continue to take prescription Vitamin D '@50'$ ,000 IU every week as prescribed.  Will check vitamin D level today.  Lab Results  Component Value Date   VD25OH 43.0 10/18/2020   VD25OH 25.2 (L) 06/08/2020   - VITAMIN D 25 Hydroxy (Vit-D Deficiency, Fractures)  2. Impaired fasting glucose, with polyphagia Controlled. Current treatment: Ozempic 2 mg subcutaneously weekly.    Plan: She will continue to focus on protein-rich, low simple carbohydrate foods. We reviewed the importance of hydration, regular exercise for stress reduction, and restorative sleep.  - Refill Semaglutide, 2 MG/DOSE, (OZEMPIC, 2 MG/DOSE,) 8 MG/3ML SOPN; Inject 2 mg into the skin once a week.  Dispense: 9 mL; Refill: 2 - Comprehensive metabolic panel - Hemoglobin A1c  3. Acquired hypothyroidism Medication: levothyroxine 175 mcg daily.   Plan: Patient was instructed not to take MVM or iron within 4 hours of taking thyroid medications.  We will continue to monitor alongside Endocrinology/PCP as it relates to her weight loss journey.   - Refill  levothyroxine (SYNTHROID) 175 MCG tablet; Take 1 tablet (175 mcg total) by mouth daily.  Dispense: 90 tablet; Refill: 3 - TSH - T4, free  4. Low ferritin Nutrition: Iron-rich foods include dark leafy greens, red and white meats, eggs, seafood, and beans.  Certain foods and drinks prevent your body from absorbing iron properly. Avoid eating these foods in the same meal as iron-rich foods or with iron supplements. These foods include: coffee, black tea, and red wine; milk, dairy products, and foods that are high in calcium; beans and soybeans; whole grains. Constipation can be a side effect of iron supplementation. Increased water and fiber intake are helpful. Water goal: > 2 liters/day. Fiber goal: > 25 grams/day.  Plan:  Will check labs today.  - Anemia panel - CBC with Differential/Platelet  5. Binge eating disorder [name] is taking Vyvanse 60 mg daily.      Plan:  The current medical regimen is effective; continue present plan and medications.    The goals for treatment of BED are to reduce eating binges and to achieve healthy eating habits. Because binge eating can correlate with negative emotions, treatment may also address any other mental-health issues, such as depression.  People who binge eat feel as if they don't have control over how much they eat and have feelings of guilt or self-loathing after a binge eating episode.  The FDA has approved Vyvanse as a treatment option for binge eating disorder. Vyvanse targets the brain's reward center by increasing the levels of dopamine and norepinephrine, the chemicals of the brain responsible for feelings of pleasure.  Mindful eating is the recommended  nutritional approach to treating BED.   - Refill ondansetron (ZOFRAN ODT) 8 MG disintegrating tablet; Take 1 tablet (8 mg total) by mouth every 12 (twelve) hours as needed for nausea or vomiting.  Dispense: 20 tablet; Refill: 0  6. Obesity, current BMI 39.3  Course: Diana Avery is currently in  the action stage of change. As such, her goal is to continue with weight loss efforts.   Nutrition goals: She has agreed to the Category 3 Plan.   Exercise goals:  As is.  Behavioral modification strategies: increasing lean protein intake, decreasing simple carbohydrates, increasing vegetables, increasing water intake, and decreasing liquid calories.  [name] has agreed to follow-up with our clinic in 6 weeks. She was informed of the importance of frequent follow-up visits to maximize her success with intensive lifestyle modifications for her multiple health conditions.   [name] was informed we would discuss her lab results at her next visit unless there is a critical issue that needs to be addressed sooner. Diana Avery agreed to keep her next visit at the agreed upon time to discuss these results.  Objective:   Blood pressure 105/81, pulse 76, temperature 97.9 F (36.6 C), temperature source Oral, height '5\' 3"'$  (1.6 m), weight 221 lb (100.2 kg), SpO2 98 %, unknown if currently breastfeeding. Body mass index is 39.15 kg/m.  General: Cooperative, alert, well developed, in no acute distress. HEENT: Conjunctivae and lids unremarkable. Cardiovascular: Regular rhythm.  Lungs: Normal work of breathing. Neurologic: No focal deficits.   Lab Results  Component Value Date   HGBA1C 5.7 (H) 10/18/2020   HGBA1C 5.7 (H) 06/08/2020   HGBA1C 5.5 11/30/2014   Lab Results  Component Value Date   INSULIN 15.8 10/18/2020   INSULIN 20.0 06/08/2020   Lab Results  Component Value Date   CHOL 167 10/18/2020   HDL 41 10/18/2020   LDLCALC 113 (H) 10/18/2020   TRIG 68 10/18/2020   CHOLHDL 4.1 10/18/2020   Lab Results  Component Value Date   VD25OH 43.0 10/18/2020   VD25OH 25.2 (L) 06/08/2020   Attestation Statements:   Reviewed by clinician on day of visit: allergies, medications, problem list, medical history, surgical history, family history, social history, and previous encounter notes.  I,  Water quality scientist, CMA, am acting as transcriptionist for Briscoe Deutscher, DO  I have reviewed the above documentation for accuracy and completeness, and I agree with the above. -  Briscoe Deutscher, DO, MS, FAAFP, DABOM - Family and Bariatric Medicine.

## 2021-05-04 ENCOUNTER — Encounter (INDEPENDENT_AMBULATORY_CARE_PROVIDER_SITE_OTHER): Payer: Self-pay | Admitting: Family Medicine

## 2021-05-04 DIAGNOSIS — E039 Hypothyroidism, unspecified: Secondary | ICD-10-CM

## 2021-05-04 MED ORDER — LEVOTHYROXINE SODIUM 150 MCG PO TABS: ORAL_TABLET | ORAL | 3 refills | Status: AC

## 2021-05-23 NOTE — Telephone Encounter (Signed)
Please advise 

## 2021-08-31 ENCOUNTER — Encounter (INDEPENDENT_AMBULATORY_CARE_PROVIDER_SITE_OTHER): Payer: Self-pay

## 2021-10-31 ENCOUNTER — Other Ambulatory Visit (HOSPITAL_COMMUNITY): Payer: Self-pay

## 2021-10-31 MED ORDER — WEGOVY 2.4 MG/0.75ML ~~LOC~~ SOAJ
2.4000 mg | SUBCUTANEOUS | 0 refills | Status: AC
  Filled 2021-10-31: qty 3, 28d supply, fill #0
  Filled 2021-11-26 – 2021-12-03 (×2): qty 3, 28d supply, fill #1

## 2021-11-02 ENCOUNTER — Other Ambulatory Visit (HOSPITAL_COMMUNITY): Payer: Self-pay

## 2021-11-04 ENCOUNTER — Other Ambulatory Visit (HOSPITAL_COMMUNITY): Payer: Self-pay

## 2021-11-09 ENCOUNTER — Other Ambulatory Visit (HOSPITAL_COMMUNITY): Payer: Self-pay

## 2021-11-10 ENCOUNTER — Other Ambulatory Visit (HOSPITAL_COMMUNITY): Payer: Self-pay

## 2021-11-15 ENCOUNTER — Other Ambulatory Visit (HOSPITAL_COMMUNITY): Payer: Self-pay

## 2021-11-15 MED ORDER — LISDEXAMFETAMINE DIMESYLATE 60 MG PO CAPS
60.0000 mg | ORAL_CAPSULE | ORAL | 0 refills | Status: DC
  Filled 2021-11-15: qty 30, 30d supply, fill #0

## 2021-11-16 ENCOUNTER — Other Ambulatory Visit (HOSPITAL_COMMUNITY): Payer: Self-pay

## 2021-11-17 ENCOUNTER — Other Ambulatory Visit (HOSPITAL_COMMUNITY): Payer: Self-pay

## 2021-11-24 ENCOUNTER — Other Ambulatory Visit (HOSPITAL_COMMUNITY): Payer: Self-pay

## 2021-11-24 MED ORDER — VITAMIN D (ERGOCALCIFEROL) 1.25 MG (50000 UNIT) PO CAPS
50000.0000 [IU] | ORAL_CAPSULE | ORAL | 0 refills | Status: DC
  Filled 2021-11-24: qty 4, 28d supply, fill #0

## 2021-11-24 MED ORDER — LINZESS 72 MCG PO CAPS
72.0000 ug | ORAL_CAPSULE | Freq: Every day | ORAL | 0 refills | Status: DC | PRN
  Filled 2021-11-24: qty 60, 30d supply, fill #0

## 2021-11-24 MED ORDER — WEGOVY 2.4 MG/0.75ML ~~LOC~~ SOAJ
2.4000 mg | SUBCUTANEOUS | 0 refills | Status: AC
  Filled 2021-11-24 – 2021-12-31 (×2): qty 3, 28d supply, fill #0

## 2021-11-24 MED ORDER — PROPRANOLOL HCL 20 MG PO TABS
20.0000 mg | ORAL_TABLET | Freq: Four times a day (QID) | ORAL | 3 refills | Status: AC | PRN
  Filled 2021-11-24 (×2): qty 90, 23d supply, fill #0

## 2021-11-24 MED ORDER — LISDEXAMFETAMINE DIMESYLATE 60 MG PO CAPS
60.0000 mg | ORAL_CAPSULE | Freq: Every morning | ORAL | 0 refills | Status: DC
  Filled 2021-11-24 – 2021-12-13 (×4): qty 30, 30d supply, fill #0

## 2021-11-25 ENCOUNTER — Other Ambulatory Visit (HOSPITAL_COMMUNITY): Payer: Self-pay

## 2021-11-26 ENCOUNTER — Other Ambulatory Visit (HOSPITAL_COMMUNITY): Payer: Self-pay

## 2021-11-28 ENCOUNTER — Other Ambulatory Visit (HOSPITAL_COMMUNITY): Payer: Self-pay

## 2021-12-03 ENCOUNTER — Other Ambulatory Visit (HOSPITAL_COMMUNITY): Payer: Self-pay

## 2021-12-05 ENCOUNTER — Other Ambulatory Visit (HOSPITAL_COMMUNITY): Payer: Self-pay

## 2021-12-13 ENCOUNTER — Other Ambulatory Visit (HOSPITAL_COMMUNITY): Payer: Self-pay

## 2021-12-31 ENCOUNTER — Other Ambulatory Visit (HOSPITAL_COMMUNITY): Payer: Self-pay

## 2022-01-03 ENCOUNTER — Other Ambulatory Visit (HOSPITAL_COMMUNITY): Payer: Self-pay

## 2022-01-09 ENCOUNTER — Other Ambulatory Visit (HOSPITAL_COMMUNITY): Payer: Self-pay

## 2022-01-09 MED ORDER — LISDEXAMFETAMINE DIMESYLATE 60 MG PO CAPS
60.0000 mg | ORAL_CAPSULE | Freq: Every morning | ORAL | 0 refills | Status: DC
  Filled 2022-01-09: qty 30, 30d supply, fill #0

## 2022-01-09 MED ORDER — LISDEXAMFETAMINE DIMESYLATE 60 MG PO CAPS
60.0000 mg | ORAL_CAPSULE | Freq: Every day | ORAL | 0 refills | Status: DC
  Filled 2022-04-06: qty 30, 30d supply, fill #0

## 2022-01-09 MED ORDER — ZEPBOUND 7.5 MG/0.5ML ~~LOC~~ SOAJ
7.5000 mg | SUBCUTANEOUS | 0 refills | Status: AC
  Filled 2022-01-09: qty 2, 28d supply, fill #0
  Filled 2022-02-05: qty 2, 28d supply, fill #1

## 2022-01-13 ENCOUNTER — Other Ambulatory Visit (HOSPITAL_COMMUNITY): Payer: Self-pay

## 2022-01-13 MED ORDER — WEGOVY 2.4 MG/0.75ML ~~LOC~~ SOAJ: SUBCUTANEOUS | 0 refills | Status: AC

## 2022-02-05 ENCOUNTER — Other Ambulatory Visit (HOSPITAL_COMMUNITY): Payer: Self-pay

## 2022-02-06 ENCOUNTER — Other Ambulatory Visit (HOSPITAL_COMMUNITY): Payer: Self-pay

## 2022-02-06 ENCOUNTER — Other Ambulatory Visit: Payer: Self-pay

## 2022-02-06 MED ORDER — LISDEXAMFETAMINE DIMESYLATE 60 MG PO CAPS
60.0000 mg | ORAL_CAPSULE | Freq: Every morning | ORAL | 0 refills | Status: AC
  Filled 2022-02-06: qty 30, 30d supply, fill #0

## 2022-02-06 MED ORDER — VITAMIN D (ERGOCALCIFEROL) 1.25 MG (50000 UNIT) PO CAPS
50000.0000 [IU] | ORAL_CAPSULE | ORAL | 0 refills | Status: DC
  Filled 2022-02-06: qty 4, 28d supply, fill #0

## 2022-02-06 MED ORDER — LINZESS 72 MCG PO CAPS
72.0000 ug | ORAL_CAPSULE | Freq: Every day | ORAL | 0 refills | Status: AC | PRN
  Filled 2022-02-06: qty 60, 30d supply, fill #0

## 2022-02-23 ENCOUNTER — Other Ambulatory Visit (HOSPITAL_COMMUNITY): Payer: Self-pay

## 2022-02-23 MED ORDER — HYDROCHLOROTHIAZIDE 25 MG PO TABS
25.0000 mg | ORAL_TABLET | Freq: Every morning | ORAL | 0 refills | Status: DC
  Filled 2022-02-23: qty 90, 90d supply, fill #0

## 2022-02-23 MED ORDER — VITAMIN D (ERGOCALCIFEROL) 1.25 MG (50000 UNIT) PO CAPS
50000.0000 [IU] | ORAL_CAPSULE | ORAL | 0 refills | Status: DC
  Filled 2022-02-23 – 2022-03-07 (×2): qty 4, 28d supply, fill #0

## 2022-02-24 ENCOUNTER — Other Ambulatory Visit (HOSPITAL_COMMUNITY): Payer: Self-pay

## 2022-02-24 MED ORDER — LISDEXAMFETAMINE DIMESYLATE 60 MG PO CAPS
60.0000 mg | ORAL_CAPSULE | Freq: Every morning | ORAL | 0 refills | Status: AC
  Filled 2022-03-07: qty 30, 30d supply, fill #0

## 2022-03-07 ENCOUNTER — Other Ambulatory Visit (HOSPITAL_COMMUNITY): Payer: Self-pay

## 2022-03-07 ENCOUNTER — Other Ambulatory Visit: Payer: Self-pay

## 2022-03-09 ENCOUNTER — Other Ambulatory Visit (HOSPITAL_COMMUNITY): Payer: Self-pay

## 2022-03-09 MED ORDER — ZEPBOUND 12.5 MG/0.5ML ~~LOC~~ SOAJ
12.5000 mg | SUBCUTANEOUS | 0 refills | Status: AC
  Filled 2022-03-09: qty 2, 28d supply, fill #0
  Filled 2022-03-10 – 2022-03-23 (×2): qty 6, 84d supply, fill #0
  Filled 2022-03-27: qty 2, 28d supply, fill #0
  Filled 2022-04-19: qty 2, 28d supply, fill #1

## 2022-03-10 ENCOUNTER — Other Ambulatory Visit (HOSPITAL_COMMUNITY): Payer: Self-pay

## 2022-03-23 ENCOUNTER — Other Ambulatory Visit (HOSPITAL_COMMUNITY): Payer: Self-pay

## 2022-03-27 ENCOUNTER — Other Ambulatory Visit (HOSPITAL_COMMUNITY): Payer: Self-pay

## 2022-03-27 ENCOUNTER — Other Ambulatory Visit: Payer: Self-pay

## 2022-04-06 ENCOUNTER — Other Ambulatory Visit (HOSPITAL_COMMUNITY): Payer: Self-pay

## 2022-04-06 MED ORDER — ZEPBOUND 15 MG/0.5ML ~~LOC~~ SOAJ
15.0000 mg | SUBCUTANEOUS | 0 refills | Status: DC
  Filled 2022-04-06 – 2022-04-21 (×5): qty 2, 28d supply, fill #0
  Filled ????-??-??: fill #0

## 2022-04-06 MED ORDER — ONDANSETRON HCL 8 MG PO TABS
ORAL_TABLET | ORAL | 1 refills | Status: AC
  Filled 2022-04-06: qty 18, 21d supply, fill #0
  Filled 2022-10-27: qty 18, 21d supply, fill #1

## 2022-04-06 MED ORDER — VITAMIN D (ERGOCALCIFEROL) 1.25 MG (50000 UNIT) PO CAPS
50000.0000 [IU] | ORAL_CAPSULE | ORAL | 0 refills | Status: DC
  Filled 2022-04-06: qty 4, 28d supply, fill #0

## 2022-04-07 ENCOUNTER — Other Ambulatory Visit (HOSPITAL_COMMUNITY): Payer: Self-pay

## 2022-04-10 ENCOUNTER — Other Ambulatory Visit (HOSPITAL_COMMUNITY): Payer: Self-pay

## 2022-04-10 MED ORDER — LISDEXAMFETAMINE DIMESYLATE 60 MG PO CAPS
ORAL_CAPSULE | ORAL | 0 refills | Status: DC
  Filled 2022-04-10 – 2022-06-09 (×2): qty 30, 30d supply, fill #0

## 2022-04-14 ENCOUNTER — Other Ambulatory Visit (HOSPITAL_COMMUNITY): Payer: Self-pay

## 2022-04-19 ENCOUNTER — Other Ambulatory Visit (HOSPITAL_COMMUNITY): Payer: Self-pay

## 2022-04-21 ENCOUNTER — Other Ambulatory Visit (HOSPITAL_COMMUNITY): Payer: Self-pay

## 2022-05-04 ENCOUNTER — Other Ambulatory Visit (HOSPITAL_COMMUNITY): Payer: Self-pay

## 2022-05-04 MED ORDER — LINZESS 72 MCG PO CAPS
72.0000 ug | ORAL_CAPSULE | Freq: Every day | ORAL | 3 refills | Status: AC
  Filled 2022-05-04: qty 30, 30d supply, fill #0

## 2022-05-04 MED ORDER — PROPRANOLOL HCL 20 MG PO TABS
20.0000 mg | ORAL_TABLET | Freq: Three times a day (TID) | ORAL | 3 refills | Status: AC
  Filled 2022-05-04: qty 90, 30d supply, fill #0

## 2022-05-04 MED ORDER — VITAMIN D (ERGOCALCIFEROL) 1.25 MG (50000 UNIT) PO CAPS
50000.0000 [IU] | ORAL_CAPSULE | ORAL | 0 refills | Status: DC
  Filled 2022-05-04: qty 4, 28d supply, fill #0

## 2022-05-04 MED ORDER — LISDEXAMFETAMINE DIMESYLATE 60 MG PO CAPS
60.0000 mg | ORAL_CAPSULE | Freq: Every morning | ORAL | 0 refills | Status: AC
  Filled 2022-05-04: qty 30, 30d supply, fill #0

## 2022-05-04 MED ORDER — HYDROCHLOROTHIAZIDE 25 MG PO TABS
25.0000 mg | ORAL_TABLET | Freq: Every morning | ORAL | 0 refills | Status: DC
  Filled 2022-05-04: qty 90, 90d supply, fill #0

## 2022-05-04 MED ORDER — ZEPBOUND 15 MG/0.5ML ~~LOC~~ SOAJ
15.0000 mg | SUBCUTANEOUS | 0 refills | Status: DC
  Filled 2022-05-04 – 2022-05-12 (×4): qty 2, 28d supply, fill #0

## 2022-05-07 ENCOUNTER — Other Ambulatory Visit (HOSPITAL_COMMUNITY): Payer: Self-pay

## 2022-05-07 MED ORDER — ONDANSETRON HCL 8 MG PO TABS
8.0000 mg | ORAL_TABLET | Freq: Every day | ORAL | 1 refills | Status: AC | PRN
  Filled 2022-05-07: qty 18, 21d supply, fill #0

## 2022-05-08 ENCOUNTER — Other Ambulatory Visit (HOSPITAL_COMMUNITY): Payer: Self-pay

## 2022-05-12 ENCOUNTER — Other Ambulatory Visit (HOSPITAL_COMMUNITY): Payer: Self-pay

## 2022-05-15 ENCOUNTER — Other Ambulatory Visit (HOSPITAL_COMMUNITY): Payer: Self-pay

## 2022-06-08 ENCOUNTER — Other Ambulatory Visit (HOSPITAL_COMMUNITY): Payer: Self-pay

## 2022-06-08 MED ORDER — ZEPBOUND 15 MG/0.5ML ~~LOC~~ SOAJ
15.0000 mg | SUBCUTANEOUS | 0 refills | Status: DC
  Filled 2022-06-08: qty 2, 28d supply, fill #0

## 2022-06-08 MED ORDER — PROPRANOLOL HCL 20 MG PO TABS
20.0000 mg | ORAL_TABLET | Freq: Three times a day (TID) | ORAL | 3 refills | Status: AC
  Filled 2022-06-08: qty 90, 30d supply, fill #0

## 2022-06-08 MED ORDER — VITAMIN D (ERGOCALCIFEROL) 1.25 MG (50000 UNIT) PO CAPS
50000.0000 [IU] | ORAL_CAPSULE | ORAL | 0 refills | Status: DC
  Filled 2022-06-08: qty 4, 28d supply, fill #0

## 2022-06-08 MED ORDER — ONDANSETRON HCL 8 MG PO TABS
8.0000 mg | ORAL_TABLET | Freq: Every day | ORAL | 1 refills | Status: AC
  Filled 2022-06-08: qty 18, 18d supply, fill #0

## 2022-06-09 ENCOUNTER — Other Ambulatory Visit (HOSPITAL_COMMUNITY): Payer: Self-pay

## 2022-06-12 ENCOUNTER — Other Ambulatory Visit: Payer: Self-pay

## 2022-06-16 ENCOUNTER — Other Ambulatory Visit (HOSPITAL_BASED_OUTPATIENT_CLINIC_OR_DEPARTMENT_OTHER): Payer: Self-pay

## 2022-06-20 ENCOUNTER — Other Ambulatory Visit (HOSPITAL_COMMUNITY): Payer: Self-pay

## 2022-06-20 MED ORDER — LISDEXAMFETAMINE DIMESYLATE 60 MG PO CAPS
ORAL_CAPSULE | ORAL | 0 refills | Status: AC
  Filled 2022-06-20 – 2022-07-10 (×2): qty 30, 30d supply, fill #0

## 2022-07-06 ENCOUNTER — Other Ambulatory Visit (HOSPITAL_COMMUNITY): Payer: Self-pay

## 2022-07-06 MED ORDER — LISDEXAMFETAMINE DIMESYLATE 60 MG PO CAPS
60.0000 mg | ORAL_CAPSULE | Freq: Every morning | ORAL | 0 refills | Status: AC
  Filled 2022-08-14: qty 30, 30d supply, fill #0

## 2022-07-06 MED ORDER — VITAMIN D (ERGOCALCIFEROL) 1.25 MG (50000 UNIT) PO CAPS
ORAL_CAPSULE | ORAL | 0 refills | Status: DC
  Filled 2022-07-06: qty 12, 84d supply, fill #0

## 2022-07-06 MED ORDER — HYDROCHLOROTHIAZIDE 25 MG PO TABS
25.0000 mg | ORAL_TABLET | Freq: Every day | ORAL | 0 refills | Status: AC
  Filled 2022-07-10 – 2023-01-04 (×2): qty 90, 90d supply, fill #0

## 2022-07-06 MED ORDER — ONDANSETRON HCL 8 MG PO TABS
ORAL_TABLET | ORAL | 1 refills | Status: AC
  Filled 2022-07-06: qty 18, 21d supply, fill #0

## 2022-07-07 ENCOUNTER — Other Ambulatory Visit (HOSPITAL_COMMUNITY): Payer: Self-pay

## 2022-07-10 ENCOUNTER — Other Ambulatory Visit (HOSPITAL_COMMUNITY): Payer: Self-pay

## 2022-07-11 ENCOUNTER — Other Ambulatory Visit: Payer: Self-pay

## 2022-08-14 ENCOUNTER — Other Ambulatory Visit (HOSPITAL_COMMUNITY): Payer: Self-pay

## 2022-08-14 ENCOUNTER — Other Ambulatory Visit: Payer: Self-pay

## 2022-08-14 MED ORDER — ONDANSETRON HCL 8 MG PO TABS
8.0000 mg | ORAL_TABLET | Freq: Every day | ORAL | 1 refills | Status: AC | PRN
  Filled 2022-08-14: qty 18, 18d supply, fill #0

## 2022-08-14 MED ORDER — LISDEXAMFETAMINE DIMESYLATE 60 MG PO CAPS
60.0000 mg | ORAL_CAPSULE | Freq: Every morning | ORAL | 0 refills | Status: AC
  Filled 2022-08-14 – 2022-09-13 (×4): qty 30, 30d supply, fill #0

## 2022-08-15 ENCOUNTER — Other Ambulatory Visit: Payer: Self-pay

## 2022-08-15 ENCOUNTER — Other Ambulatory Visit (HOSPITAL_COMMUNITY): Payer: Self-pay

## 2022-08-16 ENCOUNTER — Other Ambulatory Visit (HOSPITAL_COMMUNITY): Payer: Self-pay

## 2022-08-31 ENCOUNTER — Other Ambulatory Visit (HOSPITAL_COMMUNITY): Payer: Self-pay

## 2022-08-31 MED ORDER — LISDEXAMFETAMINE DIMESYLATE 60 MG PO CAPS: 60.0000 mg | ORAL_CAPSULE | Freq: Every morning | ORAL | 0 refills | Status: AC

## 2022-08-31 MED ORDER — HYDROCHLOROTHIAZIDE 25 MG PO TABS
25.0000 mg | ORAL_TABLET | Freq: Every day | ORAL | 0 refills | Status: AC
  Filled 2022-08-31 – 2022-09-08 (×2): qty 90, 90d supply, fill #0

## 2022-09-08 ENCOUNTER — Other Ambulatory Visit (HOSPITAL_COMMUNITY): Payer: Self-pay

## 2022-09-11 ENCOUNTER — Other Ambulatory Visit (HOSPITAL_COMMUNITY): Payer: Self-pay

## 2022-09-11 ENCOUNTER — Other Ambulatory Visit: Payer: Self-pay

## 2022-09-13 ENCOUNTER — Other Ambulatory Visit (HOSPITAL_COMMUNITY): Payer: Self-pay

## 2022-09-15 ENCOUNTER — Other Ambulatory Visit (HOSPITAL_COMMUNITY): Payer: Self-pay

## 2022-09-28 ENCOUNTER — Other Ambulatory Visit (HOSPITAL_COMMUNITY): Payer: Self-pay

## 2022-09-28 MED ORDER — LISDEXAMFETAMINE DIMESYLATE 60 MG PO CAPS
60.0000 mg | ORAL_CAPSULE | Freq: Every morning | ORAL | 0 refills | Status: AC
  Filled 2022-09-28: qty 30, 30d supply, fill #0
  Filled 2022-10-18: qty 10, 10d supply, fill #0
  Filled 2022-10-18: qty 20, 20d supply, fill #0

## 2022-09-29 ENCOUNTER — Other Ambulatory Visit (HOSPITAL_COMMUNITY): Payer: Self-pay

## 2022-09-29 MED ORDER — VITAMIN D (ERGOCALCIFEROL) 1.25 MG (50000 UNIT) PO CAPS
50000.0000 [IU] | ORAL_CAPSULE | ORAL | 0 refills | Status: DC
  Filled 2022-09-29: qty 12, 84d supply, fill #0

## 2022-10-01 ENCOUNTER — Other Ambulatory Visit (HOSPITAL_COMMUNITY): Payer: Self-pay

## 2022-10-02 ENCOUNTER — Other Ambulatory Visit (HOSPITAL_COMMUNITY): Payer: Self-pay

## 2022-10-02 MED ORDER — MOUNJARO 2.5 MG/0.5ML ~~LOC~~ SOPN
2.5000 mg | PEN_INJECTOR | SUBCUTANEOUS | 1 refills | Status: AC
Start: 2022-10-01 — End: ?
  Filled 2022-10-02: qty 2, 28d supply, fill #0

## 2022-10-18 ENCOUNTER — Other Ambulatory Visit (HOSPITAL_COMMUNITY): Payer: Self-pay

## 2022-10-19 ENCOUNTER — Other Ambulatory Visit (HOSPITAL_COMMUNITY): Payer: Self-pay

## 2022-10-27 ENCOUNTER — Other Ambulatory Visit (HOSPITAL_COMMUNITY): Payer: Self-pay

## 2022-10-28 ENCOUNTER — Other Ambulatory Visit (HOSPITAL_COMMUNITY): Payer: Self-pay

## 2022-10-29 ENCOUNTER — Other Ambulatory Visit (HOSPITAL_COMMUNITY): Payer: Self-pay

## 2022-10-29 MED ORDER — LISDEXAMFETAMINE DIMESYLATE 60 MG PO CAPS
60.0000 mg | ORAL_CAPSULE | Freq: Every morning | ORAL | 0 refills | Status: AC
  Filled 2023-01-04: qty 30, 30d supply, fill #0

## 2022-10-29 MED ORDER — ONDANSETRON HCL 8 MG PO TABS
8.0000 mg | ORAL_TABLET | Freq: Every day | ORAL | 1 refills | Status: AC | PRN
Start: 2022-10-28 — End: ?
  Filled 2022-10-29: qty 30, 30d supply, fill #0
  Filled 2023-07-10: qty 18, 21d supply, fill #0

## 2022-10-30 ENCOUNTER — Other Ambulatory Visit (HOSPITAL_COMMUNITY): Payer: Self-pay

## 2022-10-30 MED ORDER — ZEPBOUND 15 MG/0.5ML ~~LOC~~ SOAJ: 15.0000 mg | SUBCUTANEOUS | 2 refills | Status: AC

## 2022-10-30 MED ORDER — ZEPBOUND 15 MG/0.5ML ~~LOC~~ SOAJ
15.0000 mg | SUBCUTANEOUS | 0 refills | Status: AC
  Filled 2022-10-30: qty 2, 28d supply, fill #0

## 2022-10-31 ENCOUNTER — Other Ambulatory Visit (HOSPITAL_COMMUNITY): Payer: Self-pay

## 2022-10-31 MED ORDER — TRIAMCINOLONE ACETONIDE 0.1 % EX CREA
TOPICAL_CREAM | CUTANEOUS | 0 refills | Status: AC
  Filled 2022-10-31: qty 453.6, 14d supply, fill #0
  Filled 2022-11-14: qty 453.6, 30d supply, fill #0

## 2022-10-31 MED ORDER — KETOCONAZOLE 2 % EX SHAM
MEDICATED_SHAMPOO | CUTANEOUS | 5 refills | Status: AC
  Filled 2022-10-31 – 2022-11-14 (×2): qty 120, 30d supply, fill #0
  Filled 2023-03-27: qty 120, 30d supply, fill #1

## 2022-11-10 ENCOUNTER — Other Ambulatory Visit (HOSPITAL_COMMUNITY): Payer: Self-pay

## 2022-11-14 ENCOUNTER — Other Ambulatory Visit (HOSPITAL_COMMUNITY): Payer: Self-pay

## 2022-11-16 ENCOUNTER — Other Ambulatory Visit (HOSPITAL_COMMUNITY): Payer: Self-pay

## 2022-11-23 ENCOUNTER — Other Ambulatory Visit (HOSPITAL_COMMUNITY): Payer: Self-pay

## 2022-11-23 MED ORDER — HYDROCHLOROTHIAZIDE 25 MG PO TABS
25.0000 mg | ORAL_TABLET | Freq: Every day | ORAL | 0 refills | Status: AC
  Filled 2022-11-23: qty 90, 90d supply, fill #0

## 2022-11-23 MED ORDER — ONDANSETRON HCL 8 MG PO TABS
8.0000 mg | ORAL_TABLET | Freq: Every day | ORAL | 1 refills | Status: AC
Start: 2022-11-23 — End: ?
  Filled 2022-11-23: qty 18, 21d supply, fill #0
  Filled 2023-05-08: qty 18, 21d supply, fill #1

## 2022-11-23 MED ORDER — LISDEXAMFETAMINE DIMESYLATE 60 MG PO CAPS
60.0000 mg | ORAL_CAPSULE | Freq: Every morning | ORAL | 0 refills | Status: AC
Start: 2022-11-23 — End: ?
  Filled 2022-11-23: qty 30, 30d supply, fill #0

## 2022-11-23 MED ORDER — ZEPBOUND 15 MG/0.5ML ~~LOC~~ SOAJ
SUBCUTANEOUS | 0 refills | Status: AC
  Filled 2022-11-23: qty 2, 30d supply, fill #0

## 2022-11-23 MED ORDER — LEVOTHYROXINE SODIUM 150 MCG PO TABS
150.0000 ug | ORAL_TABLET | ORAL | 0 refills | Status: AC
  Filled 2022-11-23: qty 60, 84d supply, fill #0

## 2022-11-24 ENCOUNTER — Other Ambulatory Visit (HOSPITAL_COMMUNITY): Payer: Self-pay

## 2022-11-28 ENCOUNTER — Other Ambulatory Visit (HOSPITAL_COMMUNITY): Payer: Self-pay

## 2022-12-04 ENCOUNTER — Other Ambulatory Visit (HOSPITAL_COMMUNITY): Payer: Self-pay

## 2022-12-05 ENCOUNTER — Other Ambulatory Visit (HOSPITAL_COMMUNITY): Payer: Self-pay

## 2022-12-25 ENCOUNTER — Other Ambulatory Visit (HOSPITAL_COMMUNITY): Payer: Self-pay

## 2022-12-25 MED ORDER — ZEPBOUND 15 MG/0.5ML ~~LOC~~ SOAJ
15.0000 mg | SUBCUTANEOUS | 0 refills | Status: AC
  Filled 2022-12-25 – 2023-01-25 (×2): qty 2, 28d supply, fill #0

## 2022-12-25 MED ORDER — LISDEXAMFETAMINE DIMESYLATE 60 MG PO CAPS
60.0000 mg | ORAL_CAPSULE | Freq: Every morning | ORAL | 0 refills | Status: AC
  Filled 2023-04-03: qty 30, 30d supply, fill #0

## 2022-12-25 MED ORDER — VITAMIN D (ERGOCALCIFEROL) 1.25 MG (50000 UNIT) PO CAPS
ORAL_CAPSULE | ORAL | 0 refills | Status: AC
  Filled 2022-12-25: qty 12, 84d supply, fill #0

## 2022-12-25 MED ORDER — ONDANSETRON HCL 8 MG PO TABS
8.0000 mg | ORAL_TABLET | Freq: Every day | ORAL | 1 refills | Status: AC | PRN
  Filled 2022-12-25: qty 30, 30d supply, fill #0
  Filled 2023-03-27: qty 18, 21d supply, fill #1

## 2022-12-27 ENCOUNTER — Other Ambulatory Visit (HOSPITAL_COMMUNITY): Payer: Self-pay

## 2023-01-04 ENCOUNTER — Other Ambulatory Visit (HOSPITAL_COMMUNITY): Payer: Self-pay

## 2023-01-25 ENCOUNTER — Other Ambulatory Visit (HOSPITAL_COMMUNITY): Payer: Self-pay

## 2023-02-05 ENCOUNTER — Other Ambulatory Visit (HOSPITAL_COMMUNITY): Payer: Self-pay

## 2023-02-05 MED ORDER — LISDEXAMFETAMINE DIMESYLATE 60 MG PO CAPS
60.0000 mg | ORAL_CAPSULE | Freq: Every morning | ORAL | 0 refills | Status: AC
  Filled 2023-02-05: qty 30, 30d supply, fill #0

## 2023-02-05 MED ORDER — HYDROCHLOROTHIAZIDE 25 MG PO TABS
25.0000 mg | ORAL_TABLET | Freq: Every morning | ORAL | 1 refills | Status: AC
  Filled 2023-02-05: qty 90, 90d supply, fill #0
  Filled 2023-05-08: qty 90, 90d supply, fill #1

## 2023-02-05 MED ORDER — ZEPBOUND 15 MG/0.5ML ~~LOC~~ SOAJ
15.0000 mg | SUBCUTANEOUS | 0 refills | Status: AC
  Filled 2023-02-05 – 2023-03-27 (×2): qty 2, 28d supply, fill #0

## 2023-02-06 ENCOUNTER — Other Ambulatory Visit (HOSPITAL_COMMUNITY): Payer: Self-pay

## 2023-03-02 ENCOUNTER — Other Ambulatory Visit (HOSPITAL_COMMUNITY): Payer: Self-pay

## 2023-03-02 MED ORDER — LEVOTHYROXINE SODIUM 150 MCG PO TABS
ORAL_TABLET | ORAL | 0 refills | Status: AC
  Filled 2023-03-02: qty 60, 84d supply, fill #0

## 2023-03-02 MED ORDER — ZEPBOUND 15 MG/0.5ML ~~LOC~~ SOAJ
15.0000 mg | SUBCUTANEOUS | 0 refills | Status: AC
Start: 2023-03-02 — End: ?
  Filled 2023-03-02: qty 2, 28d supply, fill #0

## 2023-03-02 MED ORDER — LISDEXAMFETAMINE DIMESYLATE 60 MG PO CAPS
60.0000 mg | ORAL_CAPSULE | Freq: Every morning | ORAL | 0 refills | Status: AC
  Filled 2023-03-02 – 2023-03-05 (×2): qty 30, 30d supply, fill #0

## 2023-03-05 ENCOUNTER — Other Ambulatory Visit: Payer: Self-pay

## 2023-03-07 ENCOUNTER — Other Ambulatory Visit (HOSPITAL_COMMUNITY): Payer: Self-pay

## 2023-03-28 ENCOUNTER — Other Ambulatory Visit (HOSPITAL_COMMUNITY): Payer: Self-pay

## 2023-03-28 ENCOUNTER — Other Ambulatory Visit: Payer: Self-pay

## 2023-03-30 ENCOUNTER — Other Ambulatory Visit (HOSPITAL_COMMUNITY): Payer: Self-pay

## 2023-03-30 MED ORDER — ZEPBOUND 15 MG/0.5ML ~~LOC~~ SOAJ
15.0000 mg | SUBCUTANEOUS | 3 refills | Status: AC
  Filled 2023-05-08: qty 2, 28d supply, fill #0
  Filled 2023-10-04: qty 2, 28d supply, fill #1
  Filled 2024-02-22: qty 2, 28d supply, fill #2

## 2023-03-30 MED ORDER — LISDEXAMFETAMINE DIMESYLATE 60 MG PO CAPS
60.0000 mg | ORAL_CAPSULE | Freq: Every morning | ORAL | 0 refills | Status: DC
  Filled 2023-05-08: qty 30, 30d supply, fill #0

## 2023-04-03 ENCOUNTER — Other Ambulatory Visit (HOSPITAL_COMMUNITY): Payer: Self-pay

## 2023-05-08 ENCOUNTER — Other Ambulatory Visit (HOSPITAL_COMMUNITY): Payer: Self-pay

## 2023-05-30 ENCOUNTER — Other Ambulatory Visit (HOSPITAL_COMMUNITY): Payer: Self-pay

## 2023-05-30 MED ORDER — TRIAMCINOLONE ACETONIDE 0.1 % EX CREA
1.0000 | TOPICAL_CREAM | Freq: Two times a day (BID) | CUTANEOUS | 0 refills | Status: AC
Start: 2023-05-30 — End: ?
  Filled 2023-05-30: qty 454, 30d supply, fill #0

## 2023-05-30 MED ORDER — KETOCONAZOLE 2 % EX CREA
1.0000 | TOPICAL_CREAM | Freq: Every day | CUTANEOUS | 0 refills | Status: AC
  Filled 2023-05-30: qty 60, 30d supply, fill #0

## 2023-05-30 MED ORDER — KETOCONAZOLE 2 % EX SHAM
1.0000 | MEDICATED_SHAMPOO | CUTANEOUS | 5 refills | Status: AC
  Filled 2023-05-30: qty 120, 30d supply, fill #0
  Filled 2024-02-22: qty 120, 30d supply, fill #1

## 2023-05-30 MED ORDER — CLOBETASOL PROPIONATE 0.05 % EX SOLN
1.0000 | Freq: Every day | CUTANEOUS | 3 refills | Status: AC
  Filled 2023-05-30: qty 50, 30d supply, fill #0
  Filled 2024-02-22: qty 50, 30d supply, fill #1

## 2023-05-31 ENCOUNTER — Other Ambulatory Visit (HOSPITAL_COMMUNITY): Payer: Self-pay

## 2023-06-04 ENCOUNTER — Other Ambulatory Visit (HOSPITAL_COMMUNITY): Payer: Self-pay

## 2023-06-07 ENCOUNTER — Other Ambulatory Visit (HOSPITAL_COMMUNITY): Payer: Self-pay

## 2023-06-07 MED ORDER — ZEPBOUND 15 MG/0.5ML ~~LOC~~ SOAJ
15.0000 mg | SUBCUTANEOUS | 0 refills | Status: AC
  Filled 2023-06-07: qty 2, 28d supply, fill #0

## 2023-06-07 MED ORDER — LISDEXAMFETAMINE DIMESYLATE 60 MG PO CAPS
60.0000 mg | ORAL_CAPSULE | Freq: Every morning | ORAL | 0 refills | Status: DC
  Filled 2023-06-07: qty 30, 30d supply, fill #0

## 2023-06-07 MED ORDER — VITAMIN D (ERGOCALCIFEROL) 1.25 MG (50000 UNIT) PO CAPS
50000.0000 [IU] | ORAL_CAPSULE | ORAL | 0 refills | Status: AC
  Filled 2023-06-07: qty 12, 84d supply, fill #0

## 2023-06-08 ENCOUNTER — Other Ambulatory Visit (HOSPITAL_COMMUNITY): Payer: Self-pay

## 2023-07-02 ENCOUNTER — Other Ambulatory Visit (HOSPITAL_COMMUNITY): Payer: Self-pay

## 2023-07-02 MED ORDER — RYBELSUS 14 MG PO TABS
14.0000 mg | ORAL_TABLET | Freq: Every day | ORAL | 0 refills | Status: AC
  Filled 2023-07-02 – 2023-07-12 (×2): qty 30, 30d supply, fill #0

## 2023-07-02 MED ORDER — METFORMIN HCL 500 MG PO TABS
500.0000 mg | ORAL_TABLET | Freq: Every day | ORAL | 0 refills | Status: AC
  Filled 2023-07-02: qty 30, 30d supply, fill #0

## 2023-07-04 ENCOUNTER — Other Ambulatory Visit (HOSPITAL_COMMUNITY): Payer: Self-pay

## 2023-07-05 ENCOUNTER — Other Ambulatory Visit (HOSPITAL_COMMUNITY): Payer: Self-pay

## 2023-07-05 MED ORDER — LISDEXAMFETAMINE DIMESYLATE 60 MG PO CAPS
60.0000 mg | ORAL_CAPSULE | ORAL | 0 refills | Status: DC
  Filled 2023-07-05: qty 30, 30d supply, fill #0

## 2023-07-06 ENCOUNTER — Other Ambulatory Visit (HOSPITAL_COMMUNITY): Payer: Self-pay

## 2023-07-10 ENCOUNTER — Other Ambulatory Visit (HOSPITAL_COMMUNITY): Payer: Self-pay

## 2023-07-11 ENCOUNTER — Other Ambulatory Visit (HOSPITAL_COMMUNITY): Payer: Self-pay

## 2023-07-12 ENCOUNTER — Other Ambulatory Visit (HOSPITAL_COMMUNITY): Payer: Self-pay

## 2023-08-10 ENCOUNTER — Other Ambulatory Visit (HOSPITAL_COMMUNITY): Payer: Self-pay

## 2023-08-10 MED ORDER — ONDANSETRON HCL 8 MG PO TABS
ORAL_TABLET | ORAL | 1 refills | Status: AC
  Filled 2023-08-10: qty 18, 21d supply, fill #0
  Filled 2023-09-17: qty 18, 21d supply, fill #1

## 2023-08-14 ENCOUNTER — Other Ambulatory Visit (HOSPITAL_BASED_OUTPATIENT_CLINIC_OR_DEPARTMENT_OTHER): Payer: Self-pay

## 2023-08-14 ENCOUNTER — Other Ambulatory Visit: Payer: Self-pay

## 2023-08-14 ENCOUNTER — Other Ambulatory Visit (HOSPITAL_COMMUNITY): Payer: Self-pay

## 2023-08-14 MED ORDER — LISDEXAMFETAMINE DIMESYLATE 60 MG PO CAPS
60.0000 mg | ORAL_CAPSULE | Freq: Every morning | ORAL | 0 refills | Status: AC
  Filled 2023-09-17: qty 30, 30d supply, fill #0

## 2023-08-14 MED ORDER — LISDEXAMFETAMINE DIMESYLATE 60 MG PO CAPS
60.0000 mg | ORAL_CAPSULE | Freq: Every morning | ORAL | 0 refills | Status: AC
  Filled 2023-08-14: qty 30, 30d supply, fill #0

## 2023-08-14 MED ORDER — LISDEXAMFETAMINE DIMESYLATE 60 MG PO CAPS
60.0000 mg | ORAL_CAPSULE | Freq: Every morning | ORAL | 0 refills | Status: DC
  Filled 2023-10-17: qty 30, 30d supply, fill #0

## 2023-08-17 ENCOUNTER — Other Ambulatory Visit (HOSPITAL_COMMUNITY): Payer: Self-pay

## 2023-08-17 ENCOUNTER — Other Ambulatory Visit: Payer: Self-pay | Admitting: Family Medicine

## 2023-08-17 DIAGNOSIS — N6459 Other signs and symptoms in breast: Secondary | ICD-10-CM

## 2023-08-17 DIAGNOSIS — N6452 Nipple discharge: Secondary | ICD-10-CM

## 2023-08-17 MED ORDER — TRANEXAMIC ACID 650 MG PO TABS
1300.0000 mg | ORAL_TABLET | ORAL | 3 refills | Status: AC
  Filled 2023-08-17: qty 30, 5d supply, fill #0
  Filled 2024-02-22: qty 30, 5d supply, fill #1

## 2023-09-10 ENCOUNTER — Ambulatory Visit
Admission: RE | Admit: 2023-09-10 | Discharge: 2023-09-10 | Disposition: A | Discharge status: Home / Self Care | Payer: Self-pay | Source: Ambulatory Visit | Attending: Family Medicine

## 2023-09-10 ENCOUNTER — Ambulatory Visit
Admission: RE | Admit: 2023-09-10 | Discharge: 2023-09-10 | Disposition: A | Discharge status: Home / Self Care | Payer: Self-pay | Source: Ambulatory Visit | Attending: Family Medicine | Admitting: Family Medicine

## 2023-09-10 DIAGNOSIS — N6452 Nipple discharge: Secondary | ICD-10-CM

## 2023-09-10 DIAGNOSIS — N6459 Other signs and symptoms in breast: Secondary | ICD-10-CM

## 2023-09-17 ENCOUNTER — Other Ambulatory Visit (HOSPITAL_COMMUNITY): Payer: Self-pay

## 2023-09-20 ENCOUNTER — Other Ambulatory Visit (HOSPITAL_COMMUNITY): Payer: Self-pay

## 2023-09-26 ENCOUNTER — Other Ambulatory Visit (HOSPITAL_COMMUNITY): Payer: Self-pay

## 2023-09-28 ENCOUNTER — Other Ambulatory Visit (HOSPITAL_COMMUNITY): Payer: Self-pay

## 2023-09-28 ENCOUNTER — Other Ambulatory Visit: Payer: Self-pay | Admitting: Family

## 2023-10-01 ENCOUNTER — Other Ambulatory Visit (HOSPITAL_COMMUNITY): Payer: Self-pay

## 2023-10-01 ENCOUNTER — Encounter (HOSPITAL_COMMUNITY): Payer: Self-pay

## 2023-10-01 NOTE — Telephone Encounter (Signed)
 Christy NTBS last OV 05/14/20 NO RF sent to pharmacy last OV greater than a year

## 2023-10-01 NOTE — Telephone Encounter (Signed)
 Spoke with pt has a new PCP, is aware to contact pharmacy to have refill request sent to new pcp

## 2023-10-02 ENCOUNTER — Other Ambulatory Visit: Payer: Self-pay

## 2023-10-02 ENCOUNTER — Other Ambulatory Visit (HOSPITAL_COMMUNITY): Payer: Self-pay

## 2023-10-02 MED ORDER — LEVOTHYROXINE SODIUM 175 MCG PO TABS
175.0000 ug | ORAL_TABLET | ORAL | 1 refills | Status: AC
  Filled 2023-10-02: qty 30, 90d supply, fill #0

## 2023-10-02 MED ORDER — VITAMIN D (ERGOCALCIFEROL) 1.25 MG (50000 UNIT) PO CAPS
50000.0000 [IU] | ORAL_CAPSULE | ORAL | 0 refills | Status: AC
  Filled 2023-10-02: qty 12, 84d supply, fill #0

## 2023-10-02 MED ORDER — LEVOTHYROXINE SODIUM 150 MCG PO TABS
ORAL_TABLET | ORAL | 2 refills | Status: AC
  Filled 2023-10-02: qty 30, 30d supply, fill #0
  Filled 2023-11-13: qty 30, 42d supply, fill #1
  Filled 2023-12-31: qty 30, 42d supply, fill #2

## 2023-10-02 MED ORDER — HYDROCHLOROTHIAZIDE 25 MG PO TABS
25.0000 mg | ORAL_TABLET | Freq: Every morning | ORAL | 3 refills | Status: AC
  Filled 2023-10-02: qty 30, 30d supply, fill #0
  Filled 2023-11-13: qty 30, 30d supply, fill #1
  Filled 2023-12-31: qty 30, 30d supply, fill #2
  Filled 2024-02-07: qty 30, 30d supply, fill #3

## 2023-10-02 MED ORDER — ONDANSETRON HCL 8 MG PO TABS
8.0000 mg | ORAL_TABLET | Freq: Every day | ORAL | 1 refills | Status: AC | PRN
  Filled 2023-10-02: qty 30, 30d supply, fill #0
  Filled 2023-10-17: qty 18, 21d supply, fill #0
  Filled 2024-02-22: qty 18, 21d supply, fill #1

## 2023-10-04 ENCOUNTER — Other Ambulatory Visit (HOSPITAL_COMMUNITY): Payer: Self-pay

## 2023-10-17 ENCOUNTER — Other Ambulatory Visit (HOSPITAL_COMMUNITY): Payer: Self-pay

## 2023-11-07 ENCOUNTER — Other Ambulatory Visit (HOSPITAL_COMMUNITY): Payer: Self-pay

## 2023-11-07 MED ORDER — TRANEXAMIC ACID 650 MG PO TABS
1300.0000 mg | ORAL_TABLET | Freq: Three times a day (TID) | ORAL | 0 refills | Status: AC
  Filled 2023-11-07: qty 30, 5d supply, fill #0

## 2023-11-13 ENCOUNTER — Other Ambulatory Visit (HOSPITAL_COMMUNITY): Payer: Self-pay

## 2023-11-14 ENCOUNTER — Other Ambulatory Visit (HOSPITAL_COMMUNITY): Payer: Self-pay

## 2023-11-15 ENCOUNTER — Other Ambulatory Visit: Payer: Self-pay

## 2023-11-15 ENCOUNTER — Other Ambulatory Visit (HOSPITAL_COMMUNITY): Payer: Self-pay

## 2023-11-15 MED ORDER — LISDEXAMFETAMINE DIMESYLATE 60 MG PO CAPS
60.0000 mg | ORAL_CAPSULE | Freq: Every day | ORAL | 0 refills | Status: AC
  Filled 2023-11-15: qty 30, 30d supply, fill #0

## 2023-11-15 MED ORDER — ONDANSETRON HCL 8 MG PO TABS
8.0000 mg | ORAL_TABLET | Freq: Three times a day (TID) | ORAL | 1 refills | Status: AC | PRN
  Filled 2023-11-15: qty 18, 21d supply, fill #0
  Filled 2023-12-31: qty 18, 21d supply, fill #1

## 2023-11-15 MED ORDER — LISDEXAMFETAMINE DIMESYLATE 60 MG PO CAPS
60.0000 mg | ORAL_CAPSULE | Freq: Every day | ORAL | 0 refills | Status: AC
  Filled 2023-12-31: qty 30, 30d supply, fill #0

## 2023-11-15 MED ORDER — ZEPBOUND 15 MG/0.5ML ~~LOC~~ SOAJ
15.0000 mg | SUBCUTANEOUS | 1 refills | Status: AC
  Filled 2023-11-15: qty 2, 28d supply, fill #0

## 2023-11-16 ENCOUNTER — Other Ambulatory Visit (HOSPITAL_COMMUNITY): Payer: Self-pay

## 2023-11-26 ENCOUNTER — Other Ambulatory Visit (HOSPITAL_COMMUNITY): Payer: Self-pay

## 2023-12-04 ENCOUNTER — Other Ambulatory Visit (HOSPITAL_COMMUNITY): Payer: Self-pay

## 2023-12-04 MED ORDER — TRIAMCINOLONE ACETONIDE 0.1 % EX CREA
1.0000 | TOPICAL_CREAM | Freq: Two times a day (BID) | CUTANEOUS | 0 refills | Status: AC
  Filled 2023-12-04: qty 454, 30d supply, fill #0

## 2023-12-04 MED ORDER — CLOBETASOL PROPIONATE 0.05 % EX SOLN
1.0000 | Freq: Every day | CUTANEOUS | 3 refills | Status: AC
  Filled 2023-12-04: qty 50, 30d supply, fill #0
  Filled 2023-12-31: qty 50, 30d supply, fill #1
  Filled 2024-01-28: qty 50, 30d supply, fill #2

## 2023-12-04 MED ORDER — KETOCONAZOLE 2 % EX SHAM
1.0000 | MEDICATED_SHAMPOO | CUTANEOUS | 5 refills | Status: AC
  Filled 2023-12-04: qty 120, 30d supply, fill #0
  Filled 2023-12-31: qty 120, 30d supply, fill #1
  Filled 2024-02-22: qty 120, 30d supply, fill #2

## 2023-12-07 ENCOUNTER — Other Ambulatory Visit (HOSPITAL_COMMUNITY): Payer: Self-pay

## 2023-12-31 ENCOUNTER — Other Ambulatory Visit (HOSPITAL_COMMUNITY): Payer: Self-pay

## 2024-01-09 ENCOUNTER — Other Ambulatory Visit (HOSPITAL_COMMUNITY): Payer: Self-pay

## 2024-01-09 MED ORDER — ONDANSETRON HCL 8 MG PO TABS
8.0000 mg | ORAL_TABLET | Freq: Three times a day (TID) | ORAL | 1 refills | Status: AC | PRN
  Filled 2024-01-09: qty 18, 21d supply, fill #0
  Filled 2024-01-28: qty 30, 10d supply, fill #0

## 2024-01-09 MED ORDER — LISDEXAMFETAMINE DIMESYLATE 60 MG PO CAPS
60.0000 mg | ORAL_CAPSULE | Freq: Every morning | ORAL | 0 refills | Status: AC
  Filled 2024-01-28: qty 30, 30d supply, fill #0

## 2024-01-10 ENCOUNTER — Other Ambulatory Visit (HOSPITAL_COMMUNITY): Payer: Self-pay

## 2024-01-28 ENCOUNTER — Other Ambulatory Visit (HOSPITAL_COMMUNITY): Payer: Self-pay

## 2024-02-22 ENCOUNTER — Other Ambulatory Visit: Payer: Self-pay

## 2024-02-22 ENCOUNTER — Other Ambulatory Visit (HOSPITAL_COMMUNITY): Payer: Self-pay

## 2024-02-22 MED FILL — Lisdexamfetamine Dimesylate Cap 60 MG: 60.0000 mg | ORAL | 30 days supply | Qty: 30 | Fill #0 | Status: CN
